# Patient Record
Sex: Male | Born: 1945 | Race: Black or African American | Hispanic: No | Marital: Single | Smoking: Former smoker
Health system: Southern US, Community
[De-identification: ages and names within clinical notes are randomized; demographics above are authoritative.]

## PROBLEM LIST (undated history)

## (undated) DIAGNOSIS — K219 Gastro-esophageal reflux disease without esophagitis: Secondary | ICD-10-CM

## (undated) DIAGNOSIS — D649 Anemia, unspecified: Secondary | ICD-10-CM

## (undated) DIAGNOSIS — K5909 Other constipation: Secondary | ICD-10-CM

## (undated) DIAGNOSIS — K4090 Unilateral inguinal hernia, without obstruction or gangrene, not specified as recurrent: Secondary | ICD-10-CM

## (undated) DIAGNOSIS — E785 Hyperlipidemia, unspecified: Secondary | ICD-10-CM

## (undated) DIAGNOSIS — J3089 Other allergic rhinitis: Secondary | ICD-10-CM

## (undated) DIAGNOSIS — I351 Nonrheumatic aortic (valve) insufficiency: Secondary | ICD-10-CM

## (undated) DIAGNOSIS — Z8546 Personal history of malignant neoplasm of prostate: Secondary | ICD-10-CM

## (undated) DIAGNOSIS — Z923 Personal history of irradiation: Secondary | ICD-10-CM

## (undated) DIAGNOSIS — I10 Essential (primary) hypertension: Secondary | ICD-10-CM

## (undated) DIAGNOSIS — Z974 Presence of external hearing-aid: Secondary | ICD-10-CM

## (undated) DIAGNOSIS — Z973 Presence of spectacles and contact lenses: Secondary | ICD-10-CM

## (undated) DIAGNOSIS — I6521 Occlusion and stenosis of right carotid artery: Secondary | ICD-10-CM

## (undated) DIAGNOSIS — H9190 Unspecified hearing loss, unspecified ear: Secondary | ICD-10-CM

## (undated) DIAGNOSIS — Z972 Presence of dental prosthetic device (complete) (partial): Secondary | ICD-10-CM

## (undated) DIAGNOSIS — C61 Malignant neoplasm of prostate: Secondary | ICD-10-CM

## (undated) HISTORY — DX: Occlusion and stenosis of right carotid artery: I65.21

## (undated) HISTORY — PX: OTHER SURGICAL HISTORY: SHX169

## (undated) HISTORY — DX: Anemia, unspecified: D64.9

## (undated) HISTORY — PX: CARDIAC CATHETERIZATION: SHX172

## (undated) HISTORY — DX: Nonrheumatic aortic (valve) insufficiency: I35.1

## (undated) HISTORY — DX: Gastro-esophageal reflux disease without esophagitis: K21.9

## (undated) HISTORY — DX: Malignant neoplasm of prostate: C61

---

## 2000-05-04 HISTORY — PX: CARDIAC CATHETERIZATION: SHX172

## 2002-05-04 HISTORY — PX: PROSTATECTOMY: SHX69

## 2003-05-05 HISTORY — PX: CYST EXCISION: SHX5701

## 2018-05-04 HISTORY — PX: COLONOSCOPY: SHX174

## 2019-05-05 HISTORY — PX: UPPER GI ENDOSCOPY: SHX6162

## 2019-10-26 ENCOUNTER — Other Ambulatory Visit: Payer: Self-pay | Admitting: Orthopedic Surgery

## 2019-11-03 NOTE — Pre-Procedure Instructions (Signed)
Kenneth Daniel  11/03/2019    Your procedure is scheduled on Thursday, November 09, 2019 at 7:30 AM.   Report to [facility] Entrance "A" Admitting Office at 5:30 AM.   Call this number if you have problems the morning of surgery: [phone]   Questions prior to day of surgery, please call [phone] between 8 & 4 PM.   Remember:  Do not eat food after midnight Wednesday 11/08/19.  You may drink clear liquids until 4:30 AM. Clear liquids allowed are: Water, Juice (non-citric and without pulp - diabetics please choose diet or no sugar options), Carbonated beverages - (diabetics please choose diet or no sugar options), Clear Tea, Black Coffee only (no creamer, milk or cream including half and half) and Gatorade (diabetics please choose diet or no sugar options)   Drink the Pre-Surgery Ensure between 4:15 AM and 4:30 AM. This will be the last liquids you will drink prior to surgery.    Take these medicines the morning of surgery with A SIP OF WATER: Nebivolol (Bystolic), Acetaminophen (Tylenol) - if needed.  Stop all Vitamins and Nutritional supplements as of today prior to surgery. Do not use NSAIDS (Ibuprofen, Aleve, etc), Aspirin containing products, Herbal medications or Fish Oil.    Do not wear jewelry.  Do not wear lotions, powders, cologne or deodorant.  Men may shave face and neck.  Do not bring valuables to the hospital.  [facility] is not responsible for any belongings or valuables.  Contacts, dentures or bridgework may not be worn into surgery.  Leave your suitcase in the car.  After surgery it may be brought to your room.  For patients admitted to the hospital, discharge time will be determined by your treatment team.  Patients discharged the day of surgery will not be allowed to drive home.   [place] - Preparing for Surgery  Before surgery, you can play an important role.  Because skin is not sterile, your skin needs to be as free of germs as possible.  You can  reduce the number of germs on you skin by washing with CHG (chlorahexidine gluconate) soap before surgery.  CHG is an antiseptic cleaner which kills germs and bonds with the skin to continue killing germs even after washing.  Oral Hygiene is also important in reducing the risk of infection.  Remember to brush your teeth with your regular toothpaste the morning of surgery.  Please DO NOT use if you have an allergy to CHG or antibacterial soaps.  If your skin becomes reddened/irritated stop using the CHG and inform your nurse when you arrive at Short Stay.  Do not shave (including legs and underarms) for at least 48 hours prior to the first CHG shower.  You may shave your face.  Please follow these instructions carefully:   1.  Shower with CHG Soap the night before surgery and the morning of Surgery.  2.  If you choose to wash your hair, wash your hair first as usual with your normal shampoo.  3.  After you shampoo, rinse your hair and body thoroughly to remove the shampoo. 4.  Use CHG as you would any other liquid soap.  You can apply chg directly to the skin and wash gently with a      scrungie or washcloth.           5.  Apply the CHG Soap to your body ONLY FROM THE NECK DOWN.   Do not use on open wounds or  open sores. Avoid contact with your eyes, ears, mouth and genitals (private parts).  Wash genitals (private parts) with your normal soap - do this prior to surgery.  6.  Wash thoroughly, paying special attention to the area where your surgery will be performed.  7.  Thoroughly rinse your body with warm water from the neck down.  8.  DO NOT shower/wash with your normal soap after using and rinsing off the CHG Soap.  9.  Pat yourself dry with a clean towel.            10.  Wear clean pajamas.            11.  Place clean sheets on your bed the night of your first shower and do not sleep with pets.  Day of Surgery  Shower as above. Do not apply any lotions/deodorants the morning of surgery.    Please wear clean clothes to the hospital. Remember to brush your teeth with toothpaste.  Please read over the fact sheets that you were given.

## 2019-11-07 ENCOUNTER — Other Ambulatory Visit: Payer: Self-pay

## 2019-11-07 ENCOUNTER — Encounter (HOSPITAL_COMMUNITY): Payer: Self-pay

## 2019-11-07 ENCOUNTER — Encounter (HOSPITAL_COMMUNITY)
Admission: RE | Admit: 2019-11-07 | Discharge: 2019-11-07 | Disposition: A | Discharge status: Home / Self Care | Payer: Medicare PPO | Source: Ambulatory Visit | Attending: Orthopedic Surgery | Admitting: Orthopedic Surgery

## 2019-11-07 ENCOUNTER — Other Ambulatory Visit (HOSPITAL_COMMUNITY)
Admission: RE | Admit: 2019-11-07 | Discharge: 2019-11-07 | Disposition: A | Discharge status: Home / Self Care | Payer: Medicare PPO | Source: Ambulatory Visit | Attending: Orthopedic Surgery | Admitting: Orthopedic Surgery

## 2019-11-07 DIAGNOSIS — R5383 Other fatigue: Secondary | ICD-10-CM | POA: Diagnosis not present

## 2019-11-07 DIAGNOSIS — Z87891 Personal history of nicotine dependence: Secondary | ICD-10-CM | POA: Diagnosis not present

## 2019-11-07 DIAGNOSIS — Z7901 Long term (current) use of anticoagulants: Secondary | ICD-10-CM | POA: Diagnosis not present

## 2019-11-07 DIAGNOSIS — Z79899 Other long term (current) drug therapy: Secondary | ICD-10-CM | POA: Diagnosis not present

## 2019-11-07 DIAGNOSIS — Z20822 Contact with and (suspected) exposure to covid-19: Secondary | ICD-10-CM | POA: Diagnosis not present

## 2019-11-07 DIAGNOSIS — Z01812 Encounter for preprocedural laboratory examination: Secondary | ICD-10-CM | POA: Insufficient documentation

## 2019-11-07 DIAGNOSIS — I10 Essential (primary) hypertension: Secondary | ICD-10-CM | POA: Insufficient documentation

## 2019-11-07 DIAGNOSIS — G9589 Other specified diseases of spinal cord: Secondary | ICD-10-CM | POA: Insufficient documentation

## 2019-11-07 DIAGNOSIS — R0602 Shortness of breath: Secondary | ICD-10-CM | POA: Insufficient documentation

## 2019-11-07 DIAGNOSIS — K219 Gastro-esophageal reflux disease without esophagitis: Secondary | ICD-10-CM | POA: Insufficient documentation

## 2019-11-07 DIAGNOSIS — Z7982 Long term (current) use of aspirin: Secondary | ICD-10-CM | POA: Diagnosis not present

## 2019-11-07 HISTORY — DX: Essential (primary) hypertension: I10

## 2019-11-07 LAB — URINALYSIS, ROUTINE W REFLEX MICROSCOPIC
Bilirubin Urine: NEGATIVE
Glucose, UA: NEGATIVE mg/dL
Hgb urine dipstick: NEGATIVE
Ketones, ur: NEGATIVE mg/dL
Leukocytes,Ua: NEGATIVE
Nitrite: NEGATIVE
Protein, ur: NEGATIVE mg/dL
Specific Gravity, Urine: 1.005 (ref 1.005–1.030)
pH: 6 (ref 5.0–8.0)

## 2019-11-07 LAB — CBC WITH DIFFERENTIAL/PLATELET
Abs Immature Granulocytes: 0 10*3/uL (ref 0.00–0.07)
Basophils Absolute: 0 10*3/uL (ref 0.0–0.1)
Basophils Relative: 1 %
Eosinophils Absolute: 0 10*3/uL (ref 0.0–0.5)
Eosinophils Relative: 1 %
HCT: 40.8 % (ref 39.0–52.0)
Hemoglobin: 12.9 g/dL — ABNORMAL LOW (ref 13.0–17.0)
Immature Granulocytes: 0 %
Lymphocytes Relative: 15 %
Lymphs Abs: 0.5 10*3/uL — ABNORMAL LOW (ref 0.7–4.0)
MCH: 29 pg (ref 26.0–34.0)
MCHC: 31.6 g/dL (ref 30.0–36.0)
MCV: 91.7 fL (ref 80.0–100.0)
Monocytes Absolute: 0.4 10*3/uL (ref 0.1–1.0)
Monocytes Relative: 11 %
Neutro Abs: 2.8 10*3/uL (ref 1.7–7.7)
Neutrophils Relative %: 72 %
Platelets: 178 10*3/uL (ref 150–400)
RBC: 4.45 MIL/uL (ref 4.22–5.81)
RDW: 16 % — ABNORMAL HIGH (ref 11.5–15.5)
WBC: 3.7 10*3/uL — ABNORMAL LOW (ref 4.0–10.5)
nRBC: 0 % (ref 0.0–0.2)

## 2019-11-07 LAB — COMPREHENSIVE METABOLIC PANEL
ALT: 33 U/L (ref 0–44)
AST: 25 U/L (ref 15–41)
Albumin: 3.6 g/dL (ref 3.5–5.0)
Alkaline Phosphatase: 56 U/L (ref 38–126)
Anion gap: 10 (ref 5–15)
BUN: 18 mg/dL (ref 8–23)
CO2: 26 mmol/L (ref 22–32)
Calcium: 9.3 mg/dL (ref 8.9–10.3)
Chloride: 104 mmol/L (ref 98–111)
Creatinine, Ser: 0.99 mg/dL (ref 0.61–1.24)
GFR calc Af Amer: >60 mL/min (ref >60)
GFR calc non Af Amer: >60 mL/min (ref >60)
Glucose, Bld: 100 mg/dL — ABNORMAL HIGH (ref 70–99)
Potassium: 4 mmol/L (ref 3.5–5.1)
Sodium: 140 mmol/L (ref 135–145)
Total Bilirubin: 0.7 mg/dL (ref 0.3–1.2)
Total Protein: 7 g/dL (ref 6.5–8.1)

## 2019-11-07 LAB — SARS CORONAVIRUS 2 (TAT 6-24 HRS): SARS Coronavirus 2: NEGATIVE

## 2019-11-07 LAB — PROTIME-INR
INR: 1.1 (ref 0.8–1.2)
Prothrombin Time: 13.5 seconds (ref 11.4–15.2)

## 2019-11-07 LAB — SURGICAL PCR SCREEN
MRSA, PCR: NEGATIVE
Staphylococcus aureus: NEGATIVE

## 2019-11-07 LAB — TYPE AND SCREEN
ABO/RH(D): B POS
Antibody Screen: NEGATIVE

## 2019-11-07 LAB — APTT: aPTT: 27 seconds (ref 24–36)

## 2019-11-07 NOTE — Progress Notes (Signed)
Anesthesia Note:   Case: [id number] Date/Time: 11/09/19 0715   Procedure: ANTERIOR CERVICAL DECOMPRESSION FUSION CERVICAL 3-4 WITH INSTRUMENTATION AND ALLOGRAFT (N/A )   Anesthesia type: General   Pre-op diagnosis: CERVICAL MYELOPATHY   Location: [facility] OR ROOM 05 / [place]   Surgeons: Phylliss Bob, MD      DISCUSSION: Patient is a 74 year old male scheduled for the above procedure.  History includes former smoker (quit 05/04/72), HTN, hypercholesterolemia, anemia (borderline), acid reflux, prostate cancer (s/p prostatectomy 2004). He reported a negative sleep study ~ 6-7 years ago.   He had a recent cardiology evaluation in December 2020 at [facility] in [place] by Orie Rout, MD. On 10/23/19, Dr. Rodena Medin signed a cardiac clearance form indicating, "This patient is not at increased risk for cardiovascular event for planned surgery versus an age-matched cohort."  Last ASA 11/03/19. 11/07/19 presurgical COVID-19 test is in process.  Anesthesia team to evaluate on the day of surgery.   VS: BP (!) 162/91   Pulse 83   Temp 36.6 C (Oral)   Resp 18   Ht 6\' 2"  (1.88 m)   Wt 77.7 kg   SpO2 99%   BMI 21.98 kg/m     PROVIDERS: Lucianne Lei, MD is PCP As above, saw Orie Rout, MD for cardiologist evaluation in late 2020.   LABS: Labs reviewed: Acceptable for surgery. (all labs ordered are listed, but only abnormal results are displayed)  Labs Reviewed  CBC WITH DIFFERENTIAL/PLATELET - Abnormal; Notable for the following components:      Result Value   WBC 3.7 (*)    Hemoglobin 12.9 (*)    RDW 16.0 (*)    Lymphs Abs 0.5 (*)    All other components within normal limits  COMPREHENSIVE METABOLIC PANEL - Abnormal; Notable for the following components:   Glucose, Bld 100 (*)    All other components within normal limits  URINALYSIS, ROUTINE W REFLEX MICROSCOPIC - Abnormal; Notable for the following components:   Color, Urine STRAW (*)    All other components within normal limits   SURGICAL PCR SCREEN  APTT  PROTIME-INR  TYPE AND SCREEN     EKG: Copies of EKGs during 04/23/20 exercise stress test [facility]) showed: NSR, baseline artifact. Probably borderline/non-specific inferior ST abnormality--no significant ST/T abnormality noted.      CV: Echo 04/24/19 [facility]): Conclusions: Left ventricle cavity is normal in size. Concentric remodeling of the left ventricle. Normal global wall motion. Visual EF is 55 to 60%. Doppler evidence of grade 1 (impaired) diastolic dysfunction, normal LAP. Calculated EF 55%. Trileaflet aortic valve with no regurgitation noted. Mild aortic valve leaflet thickening with mild calcification.  Exercise Nuclear stress test 04/24/19 [facility]): Stress ECG: No evidence of ischemia. BP response: Normal. Arrhythmias: None. Symptoms: Shortness of breath.  Fatigue. Exercise tolerance: Good. SPECT imaging findings: No evidence of ischemia.  No wall motion abnormalities.  Ejection fraction 60%. Impression: No evidence of ischemia.  He reported a remote history of cardiac cath in 2001 that did not require any intervention.   Past Medical History:  Diagnosis Date  . Acid reflux   . Borderline anemia   . Hypertension   . Prostate cancer [facility])     Past Surgical History:  Procedure Laterality Date  . PROSTATECTOMY  2004    MEDICATIONS: . acetaminophen (TYLENOL) 500 MG tablet  . aluminum hydroxide-magnesium carbonate (GAVISCON) 95-358 MG/15ML SUSP  . amLODipine (NORVASC) 10 MG tablet  . ascorbic acid (VITAMIN C) 500 MG tablet  .  aspirin EC 81 MG tablet  . atorvastatin (LIPITOR) 40 MG tablet  . Azelastine HCl 0.15 % SOLN  . b complex vitamins tablet  . cholecalciferol (VITAMIN D3) 25 MCG (1000 UNIT) tablet  . famotidine (PEPCID) 40 MG tablet  . Magnesium 250 MG TABS  . Nebivolol HCl (BYSTOLIC) 20 MG TABS  . Nutritional Supplements (JUICE PLUS FIBRE PO)  . Probiotic Product (PROBIOTIC  DAILY PO)  . quinapril (ACCUPRIL) 40 MG tablet  . sacubitril-valsartan (ENTRESTO) 24-26 MG  . zinc gluconate 50 MG tablet   No current facility-administered medications for this encounter.    Myra Gianotti, PA-C Surgical Short Stay/Anesthesiology [facility] Phone [phone] [facility] Phone [phone] 11/07/2019 2:35 PM

## 2019-11-07 NOTE — Progress Notes (Addendum)
PCP - Dr. Lucianne Lei Cardiologist - Dr. Orie Rout  PPM/ICD - denies  Chest x-ray - N/A EKG - 04/23/2019 (tracing faxed)  Stress Test - 04/24/2019 ECHO - 04/24/2019 Cardiac Cath - 2001 per patient no stent placed  Sleep Study - YES, patient stated it was about 6-7 years ago but was negative   DM: denies  Blood Thinner Instructions: N/A Aspirin Instructions: LD 11/03/2019  ERAS Protcol - Yes PRE-SURGERY Ensure or G2- Ensure given  COVID TEST-  Scheduled for today 11/07/2019 after PAT appointment. Patient verbalized understanding of self-quarantine instructions.  Anesthesia review: YES, cardiac hx, records faxed, new to health system  Patient denies shortness of breath, fever, cough and chest pain at PAT appointment  All instructions explained to the patient, with a verbal understanding of the material. Patient agrees to go over the instructions while at home for a better understanding. Patient also instructed to self quarantine after being tested for COVID-19. The opportunity to ask questions was provided.

## 2019-11-07 NOTE — Progress Notes (Addendum)
[id number] - [place], [place] [address] [place] [address] Phone: [phone] Fax: [phone]   Your procedure is scheduled on Thursday, July 8th.  Report to [facility] Main Entrance "A" at 5:30 A.M., and check in at the Admitting office.  Call this number if you have problems the morning of surgery:  [phone]  Call [phone] if you have any questions prior to your surgery date Monday-Friday 8am-4pm   Remember:  Do not eat after midnight the night before your surgery  You may drink clear liquids until 4:30 A.M the morning of your surgery.   Clear liquids allowed are: Water, Non-Citrus Juices (without pulp), Carbonated Beverages, Clear Tea, Black Coffee Only, and Gatorade   Enhanced Recovery after Surgery for Orthopedics Enhanced Recovery after Surgery is a protocol used to improve the stress on your body and your recovery after surgery.  Patient Instructions  . The night before surgery:  o No food after midnight. ONLY clear liquids after midnight  .  Marland Kitchen The day of surgery (if you do NOT have diabetes):  o Drink ONE (1) Pre-Surgery Clear Ensure 4:30 A.M. the morning of surgery   o This drink was given to you during your hospital  pre-op appointment visit. o Nothing else to drink after completing the  Pre-Surgery Clear Ensure.         If you have questions, please contact your surgeon's office.     Take these medicines the morning of surgery with A SIP OF WATER Nebivolol HCl (BYSTOLIC)  If needed - acetaminophen (TYLENOL), Azelastine HCl/nasal spray    As of today, STOP taking any Aspirin (unless otherwise instructed by your surgeon) Aleve, Naproxen, Ibuprofen, Motrin, Advil, Goody's, BC's, all herbal medications, fish oil, and all vitamins.                     Do not wear jewelry.            Do not wear lotions, powders, perfumes/colognes, or deodorant.            Men may shave face  and neck.            Do not bring valuables to the hospital.            [facility] is not responsible for any belongings or valuables.  Do NOT Smoke (Tobacco/Vaping) or drink Alcohol 24 hours prior to your procedure If you use a CPAP at night, you may bring all equipment for your overnight stay.   Contacts, glasses, dentures or bridgework may not be worn into surgery.      For patients admitted to the hospital, discharge time will be determined by your treatment team.   Patients discharged the day of surgery will not be allowed to drive home, and someone needs to stay with them for 24 hours.  Special instructions:   [place]- Preparing For Surgery  Before surgery, you can play an important role. Because skin is not sterile, your skin needs to be as free of germs as possible. You can reduce the number of germs on your skin by washing with CHG (chlorahexidine gluconate) Soap before surgery.  CHG is an antiseptic cleaner which kills germs and bonds with the skin to continue killing germs even after washing.    Oral Hygiene is also important to reduce your risk of infection.  Remember - BRUSH YOUR TEETH THE MORNING OF SURGERY WITH YOUR REGULAR  TOOTHPASTE  Please do not use if you have an allergy to CHG or antibacterial soaps. If your skin becomes reddened/irritated stop using the CHG.  Do not shave (including legs and underarms) for at least 48 hours prior to first CHG shower. It is OK to shave your face.  Please follow these instructions carefully.   1. Shower the NIGHT BEFORE SURGERY and the MORNING OF SURGERY with CHG Soap.   2. If you chose to wash your hair, wash your hair first as usual with your normal shampoo.  3. After you shampoo, rinse your hair and body thoroughly to remove the shampoo.  4. Use CHG as you would any other liquid soap. You can apply CHG directly to the skin and wash gently with a scrungie or a clean washcloth.   5. Apply the CHG Soap to your body ONLY FROM  THE NECK DOWN.  Do not use on open wounds or open sores. Avoid contact with your eyes, ears, mouth and genitals (private parts). Wash Face and genitals (private parts)  with your normal soap.   6. Wash thoroughly, paying special attention to the area where your surgery will be performed.  7. Thoroughly rinse your body with warm water from the neck down.  8. DO NOT shower/wash with your normal soap after using and rinsing off the CHG Soap.  9. Pat yourself dry with a CLEAN TOWEL.  10. Wear CLEAN PAJAMAS to bed the night before surgery  11. Place CLEAN SHEETS on your bed the night of your first shower and DO NOT SLEEP WITH PETS.   Day of Surgery: Wear Clean/Comfortable clothing the morning of surgery Do not apply any deodorants/lotions.   Remember to brush your teeth WITH YOUR REGULAR TOOTHPASTE.   Please read over the following fact sheets that you were given.

## 2019-11-07 NOTE — Anesthesia Preprocedure Evaluation (Addendum)
Anesthesia Evaluation  Patient identified by MRN, date of birth, ID band Patient awake    Reviewed: Allergy & Precautions, H&P , NPO status , Patient's Chart, lab work & pertinent test results  Airway Mallampati: II   Neck ROM: full    Dental   Pulmonary former smoker,    breath sounds clear to auscultation       Cardiovascular hypertension,  Rhythm:regular Rate:Normal     Neuro/Psych    GI/Hepatic GERD  ,  Endo/Other    Renal/GU      Musculoskeletal   Abdominal   Peds  Hematology   Anesthesia Other Findings   Reproductive/Obstetrics                             Anesthesia Physical Anesthesia Plan  ASA: II  Anesthesia Plan: General   Post-op Pain Management:    Induction: Intravenous  PONV Risk Score and Plan: 2 and Ondansetron, Dexamethasone and Treatment may vary due to age or medical condition  Airway Management Planned: Oral ETT  Additional Equipment:   Intra-op Plan:   Post-operative Plan: Extubation in OR  Informed Consent: I have reviewed the patients History and Physical, chart, labs and discussed the procedure including the risks, benefits and alternatives for the proposed anesthesia with the patient or authorized representative who has indicated his/her understanding and acceptance.       Plan Discussed with: CRNA, Anesthesiologist and Surgeon  Anesthesia Plan Comments: (PAT note written 11/07/2019 by Myra Gianotti, PA-C. )       Anesthesia Quick Evaluation

## 2019-11-09 ENCOUNTER — Other Ambulatory Visit: Payer: Self-pay

## 2019-11-09 ENCOUNTER — Ambulatory Visit (HOSPITAL_COMMUNITY)
Admission: RE | Disposition: A | Discharge status: Home / Self Care | Payer: Self-pay | Source: Home / Self Care | Attending: Orthopedic Surgery

## 2019-11-09 ENCOUNTER — Observation Stay (HOSPITAL_COMMUNITY)
Admission: RE | Admit: 2019-11-09 | Discharge: 2019-11-10 | Disposition: A | Discharge status: Home / Self Care | Payer: Medicare PPO | Attending: Orthopedic Surgery | Admitting: Orthopedic Surgery

## 2019-11-09 ENCOUNTER — Ambulatory Visit (HOSPITAL_COMMUNITY): Payer: Medicare PPO

## 2019-11-09 ENCOUNTER — Ambulatory Visit (HOSPITAL_COMMUNITY): Payer: Medicare PPO | Admitting: Vascular Surgery

## 2019-11-09 ENCOUNTER — Encounter (HOSPITAL_COMMUNITY): Payer: Self-pay | Admitting: Orthopedic Surgery

## 2019-11-09 DIAGNOSIS — I10 Essential (primary) hypertension: Secondary | ICD-10-CM | POA: Insufficient documentation

## 2019-11-09 DIAGNOSIS — Z79899 Other long term (current) drug therapy: Secondary | ICD-10-CM | POA: Diagnosis not present

## 2019-11-09 DIAGNOSIS — G959 Disease of spinal cord, unspecified: Secondary | ICD-10-CM | POA: Diagnosis present

## 2019-11-09 DIAGNOSIS — Z7982 Long term (current) use of aspirin: Secondary | ICD-10-CM | POA: Diagnosis not present

## 2019-11-09 DIAGNOSIS — R2689 Other abnormalities of gait and mobility: Secondary | ICD-10-CM | POA: Diagnosis present

## 2019-11-09 DIAGNOSIS — Z87891 Personal history of nicotine dependence: Secondary | ICD-10-CM | POA: Diagnosis not present

## 2019-11-09 DIAGNOSIS — M5001 Cervical disc disorder with myelopathy,  high cervical region: Secondary | ICD-10-CM | POA: Diagnosis not present

## 2019-11-09 DIAGNOSIS — Z419 Encounter for procedure for purposes other than remedying health state, unspecified: Secondary | ICD-10-CM

## 2019-11-09 HISTORY — DX: Hyperlipidemia, unspecified: E78.5

## 2019-11-09 HISTORY — DX: Presence of spectacles and contact lenses: Z97.3

## 2019-11-09 HISTORY — PX: ANTERIOR CERVICAL DECOMP/DISCECTOMY FUSION: SHX1161

## 2019-11-09 HISTORY — DX: Unspecified hearing loss, unspecified ear: H91.90

## 2019-11-09 LAB — ABO/RH: ABO/RH(D): B POS

## 2019-11-09 SURGERY — ANTERIOR CERVICAL DECOMPRESSION/DISCECTOMY FUSION 1 LEVEL
Anesthesia: General | Site: Spine Cervical

## 2019-11-09 MED ORDER — QUINAPRIL HCL 10 MG PO TABS: 40.0000 mg | ORAL_TABLET | Freq: Every day | ORAL | Status: DC

## 2019-11-09 MED ORDER — AMLODIPINE BESYLATE 5 MG PO TABS
10.0000 mg | ORAL_TABLET | Freq: Every day | ORAL | Status: DC
  Administered 2019-11-09: 10 mg via ORAL
  Filled 2019-11-09: qty 2

## 2019-11-09 MED ORDER — ONDANSETRON HCL 4 MG/2ML IJ SOLN
INTRAMUSCULAR | Status: DC | PRN
  Administered 2019-11-09: 4 mg via INTRAVENOUS

## 2019-11-09 MED ORDER — PHENOL 1.4 % MT LIQD
1.0000 | OROMUCOSAL | Status: DC | PRN
  Filled 2019-11-09: qty 177

## 2019-11-09 MED ORDER — ACETAMINOPHEN 10 MG/ML IV SOLN
INTRAVENOUS | Status: DC | PRN
Start: 2019-11-09 — End: 2019-11-09
  Administered 2019-11-09: 1000 mg via INTRAVENOUS

## 2019-11-09 MED ORDER — DEXAMETHASONE SODIUM PHOSPHATE 10 MG/ML IJ SOLN
INTRAMUSCULAR | Status: AC
  Filled 2019-11-09: qty 1

## 2019-11-09 MED ORDER — ONDANSETRON HCL 4 MG/2ML IJ SOLN
INTRAMUSCULAR | Status: AC
  Filled 2019-11-09: qty 2

## 2019-11-09 MED ORDER — ACETAMINOPHEN 10 MG/ML IV SOLN
INTRAVENOUS | Status: AC
  Filled 2019-11-09: qty 100

## 2019-11-09 MED ORDER — OXYCODONE HCL 5 MG PO TABS: 5.0000 mg | ORAL_TABLET | Freq: Once | ORAL | Status: DC | PRN

## 2019-11-09 MED ORDER — FENTANYL CITRATE (PF) 100 MCG/2ML IJ SOLN: 25.0000 ug | INTRAMUSCULAR | Status: DC | PRN

## 2019-11-09 MED ORDER — ALUM HYDROXIDE-MAG CARBONATE 95-358 MG/15ML PO SUSP: 30.0000 mL | ORAL | Status: DC | PRN

## 2019-11-09 MED ORDER — LIDOCAINE 2% (20 MG/ML) 5 ML SYRINGE
INTRAMUSCULAR | Status: DC | PRN
  Administered 2019-11-09: 60 mg via INTRAVENOUS

## 2019-11-09 MED ORDER — ONDANSETRON HCL 4 MG/2ML IJ SOLN
4.0000 mg | Freq: Four times a day (QID) | INTRAMUSCULAR | Status: AC | PRN
  Administered 2019-11-09: 4 mg via INTRAVENOUS

## 2019-11-09 MED ORDER — OXYCODONE-ACETAMINOPHEN 5-325 MG PO TABS: 1.0000 | ORAL_TABLET | ORAL | 0 refills | Status: DC | PRN

## 2019-11-09 MED ORDER — ONDANSETRON HCL 4 MG/2ML IJ SOLN: 4.0000 mg | Freq: Four times a day (QID) | INTRAMUSCULAR | Status: DC | PRN

## 2019-11-09 MED ORDER — NEBIVOLOL HCL 10 MG PO TABS
20.0000 mg | ORAL_TABLET | Freq: Every day | ORAL | Status: DC
  Administered 2019-11-10: 20 mg via ORAL
  Filled 2019-11-09: qty 2

## 2019-11-09 MED ORDER — DEXAMETHASONE SODIUM PHOSPHATE 10 MG/ML IJ SOLN
INTRAMUSCULAR | Status: DC | PRN
  Administered 2019-11-09: 10 mg via INTRAVENOUS

## 2019-11-09 MED ORDER — PHENYLEPHRINE HCL (PRESSORS) 10 MG/ML IV SOLN
INTRAVENOUS | Status: DC | PRN
  Administered 2019-11-09 (×3): 80 ug via INTRAVENOUS

## 2019-11-09 MED ORDER — ZOLPIDEM TARTRATE 5 MG PO TABS: 5.0000 mg | ORAL_TABLET | Freq: Every evening | ORAL | Status: DC | PRN

## 2019-11-09 MED ORDER — SUGAMMADEX SODIUM 200 MG/2ML IV SOLN
INTRAVENOUS | Status: DC | PRN
  Administered 2019-11-09: 200 mg via INTRAVENOUS

## 2019-11-09 MED ORDER — HEMOSTATIC AGENTS (NO CHARGE) OPTIME
TOPICAL | Status: DC | PRN
  Administered 2019-11-09 (×2): 1 via TOPICAL

## 2019-11-09 MED ORDER — ATORVASTATIN CALCIUM 40 MG PO TABS
40.0000 mg | ORAL_TABLET | Freq: Every day | ORAL | Status: DC
  Administered 2019-11-09: 40 mg via ORAL
  Filled 2019-11-09: qty 1

## 2019-11-09 MED ORDER — FENTANYL CITRATE (PF) 250 MCG/5ML IJ SOLN
INTRAMUSCULAR | Status: DC | PRN
  Administered 2019-11-09: 25 ug via INTRAVENOUS
  Administered 2019-11-09 (×2): 50 ug via INTRAVENOUS
  Administered 2019-11-09: 25 ug via INTRAVENOUS

## 2019-11-09 MED ORDER — VITAMIN D 25 MCG (1000 UNIT) PO TABS: 1000.0000 [IU] | ORAL_TABLET | Freq: Every day | ORAL | Status: DC

## 2019-11-09 MED ORDER — BISACODYL 5 MG PO TBEC: 5.0000 mg | DELAYED_RELEASE_TABLET | Freq: Every day | ORAL | Status: DC | PRN

## 2019-11-09 MED ORDER — MAGNESIUM 250 MG PO TABS: 250.0000 mg | ORAL_TABLET | Freq: Every day | ORAL | Status: DC

## 2019-11-09 MED ORDER — ROCURONIUM BROMIDE 10 MG/ML (PF) SYRINGE
PREFILLED_SYRINGE | INTRAVENOUS | Status: AC
  Filled 2019-11-09: qty 10

## 2019-11-09 MED ORDER — PHENYLEPHRINE HCL-NACL 10-0.9 MG/250ML-% IV SOLN
INTRAVENOUS | Status: AC
  Filled 2019-11-09: qty 250

## 2019-11-09 MED ORDER — SODIUM CHLORIDE 0.9 % IV SOLN: 250.0000 mL | INTRAVENOUS | Status: DC

## 2019-11-09 MED ORDER — FENTANYL CITRATE (PF) 250 MCG/5ML IJ SOLN
INTRAMUSCULAR | Status: AC
  Filled 2019-11-09: qty 5

## 2019-11-09 MED ORDER — METHOCARBAMOL 500 MG PO TABS: 500.0000 mg | ORAL_TABLET | Freq: Four times a day (QID) | ORAL | 1 refills | Status: DC | PRN

## 2019-11-09 MED ORDER — PHENYLEPHRINE HCL-NACL 10-0.9 MG/250ML-% IV SOLN
INTRAVENOUS | Status: DC | PRN
  Administered 2019-11-09: 20 ug/min via INTRAVENOUS

## 2019-11-09 MED ORDER — METHOCARBAMOL 1000 MG/10ML IJ SOLN
500.0000 mg | Freq: Four times a day (QID) | INTRAVENOUS | Status: DC | PRN
  Filled 2019-11-09: qty 5

## 2019-11-09 MED ORDER — FAMOTIDINE 20 MG PO TABS
40.0000 mg | ORAL_TABLET | Freq: Every day | ORAL | Status: DC
  Administered 2019-11-09: 40 mg via ORAL
  Filled 2019-11-09: qty 2

## 2019-11-09 MED ORDER — ACETAMINOPHEN 650 MG RE SUPP: 650.0000 mg | RECTAL | Status: DC | PRN

## 2019-11-09 MED ORDER — DEXMEDETOMIDINE HCL 200 MCG/2ML IV SOLN
INTRAVENOUS | Status: DC | PRN
  Administered 2019-11-09: 8 ug via INTRAVENOUS

## 2019-11-09 MED ORDER — 0.9 % SODIUM CHLORIDE (POUR BTL) OPTIME
TOPICAL | Status: DC | PRN
  Administered 2019-11-09 (×2): 1000 mL

## 2019-11-09 MED ORDER — PANTOPRAZOLE SODIUM 40 MG IV SOLR: 40.0000 mg | Freq: Every day | INTRAVENOUS | Status: DC

## 2019-11-09 MED ORDER — ONDANSETRON HCL 4 MG PO TABS: 4.0000 mg | ORAL_TABLET | Freq: Four times a day (QID) | ORAL | Status: DC | PRN

## 2019-11-09 MED ORDER — PANTOPRAZOLE SODIUM 40 MG PO TBEC
40.0000 mg | DELAYED_RELEASE_TABLET | Freq: Every day | ORAL | Status: DC
  Administered 2019-11-09: 40 mg via ORAL
  Filled 2019-11-09: qty 1

## 2019-11-09 MED ORDER — SODIUM CHLORIDE 0.9% FLUSH
3.0000 mL | Freq: Two times a day (BID) | INTRAVENOUS | Status: DC
  Administered 2019-11-09: 3 mL via INTRAVENOUS

## 2019-11-09 MED ORDER — LACTATED RINGERS IV SOLN: INTRAVENOUS | Status: DC | PRN

## 2019-11-09 MED ORDER — PROPOFOL 10 MG/ML IV BOLUS
INTRAVENOUS | Status: DC | PRN
  Administered 2019-11-09: 180 mg via INTRAVENOUS

## 2019-11-09 MED ORDER — ALUM & MAG HYDROXIDE-SIMETH 200-200-20 MG/5ML PO SUSP: 30.0000 mL | Freq: Four times a day (QID) | ORAL | Status: DC | PRN

## 2019-11-09 MED ORDER — CEFAZOLIN SODIUM-DEXTROSE 2-4 GM/100ML-% IV SOLN
2.0000 g | INTRAVENOUS | Status: AC
  Administered 2019-11-09: 2 g via INTRAVENOUS
  Filled 2019-11-09: qty 100

## 2019-11-09 MED ORDER — ROCURONIUM BROMIDE 10 MG/ML (PF) SYRINGE
PREFILLED_SYRINGE | INTRAVENOUS | Status: DC | PRN
  Administered 2019-11-09: 20 mg via INTRAVENOUS
  Administered 2019-11-09: 60 mg via INTRAVENOUS
  Administered 2019-11-09: 20 mg via INTRAVENOUS

## 2019-11-09 MED ORDER — BUPIVACAINE-EPINEPHRINE 0.25% -1:200000 IJ SOLN
INTRAMUSCULAR | Status: DC | PRN
  Administered 2019-11-09: 6.5 mL

## 2019-11-09 MED ORDER — SENNOSIDES-DOCUSATE SODIUM 8.6-50 MG PO TABS
1.0000 | ORAL_TABLET | Freq: Every evening | ORAL | Status: DC | PRN
  Administered 2019-11-09: 1 via ORAL
  Filled 2019-11-09: qty 1

## 2019-11-09 MED ORDER — PHENYLEPHRINE 40 MCG/ML (10ML) SYRINGE FOR IV PUSH (FOR BLOOD PRESSURE SUPPORT)
PREFILLED_SYRINGE | INTRAVENOUS | Status: AC
  Filled 2019-11-09: qty 10

## 2019-11-09 MED ORDER — OXYCODONE HCL 5 MG/5ML PO SOLN: 5.0000 mg | Freq: Once | ORAL | Status: DC | PRN

## 2019-11-09 MED ORDER — METHOCARBAMOL 500 MG PO TABS
500.0000 mg | ORAL_TABLET | Freq: Four times a day (QID) | ORAL | Status: DC | PRN
  Administered 2019-11-09: 500 mg via ORAL
  Filled 2019-11-09: qty 1

## 2019-11-09 MED ORDER — SODIUM CHLORIDE 0.9% FLUSH: 3.0000 mL | INTRAVENOUS | Status: DC | PRN

## 2019-11-09 MED ORDER — CEFAZOLIN SODIUM-DEXTROSE 2-4 GM/100ML-% IV SOLN
2.0000 g | Freq: Three times a day (TID) | INTRAVENOUS | Status: AC
  Administered 2019-11-09 (×2): 2 g via INTRAVENOUS
  Filled 2019-11-09 (×2): qty 100

## 2019-11-09 MED ORDER — THROMBIN (RECOMBINANT) 20000 UNITS EX SOLR
CUTANEOUS | Status: AC
  Filled 2019-11-09: qty 20000

## 2019-11-09 MED ORDER — QUINAPRIL HCL 10 MG PO TABS
40.0000 mg | ORAL_TABLET | Freq: Every day | ORAL | Status: DC
  Administered 2019-11-09: 40 mg via ORAL
  Filled 2019-11-09: qty 4

## 2019-11-09 MED ORDER — PROPOFOL 10 MG/ML IV BOLUS
INTRAVENOUS | Status: AC
  Filled 2019-11-09: qty 20

## 2019-11-09 MED ORDER — LIDOCAINE 2% (20 MG/ML) 5 ML SYRINGE
INTRAMUSCULAR | Status: AC
  Filled 2019-11-09: qty 5

## 2019-11-09 MED ORDER — FLEET ENEMA 7-19 GM/118ML RE ENEM: 1.0000 | ENEMA | Freq: Once | RECTAL | Status: DC | PRN

## 2019-11-09 MED ORDER — OXYCODONE-ACETAMINOPHEN 5-325 MG PO TABS
1.0000 | ORAL_TABLET | ORAL | Status: DC | PRN
  Administered 2019-11-09: 1 via ORAL
  Filled 2019-11-09: qty 1

## 2019-11-09 MED ORDER — ACETAMINOPHEN 325 MG PO TABS: 650.0000 mg | ORAL_TABLET | ORAL | Status: DC | PRN

## 2019-11-09 MED ORDER — BUPIVACAINE-EPINEPHRINE (PF) 0.25% -1:200000 IJ SOLN
INTRAMUSCULAR | Status: AC
  Filled 2019-11-09: qty 20

## 2019-11-09 MED ORDER — THROMBIN 20000 UNITS EX SOLR
CUTANEOUS | Status: DC | PRN
  Administered 2019-11-09: 20000 [IU] via TOPICAL

## 2019-11-09 MED ORDER — MENTHOL 3 MG MT LOZG: 1.0000 | LOZENGE | OROMUCOSAL | Status: DC | PRN

## 2019-11-09 MED ORDER — POVIDONE-IODINE 7.5 % EX SOLN
Freq: Once | CUTANEOUS | Status: DC
  Filled 2019-11-09: qty 118

## 2019-11-09 MED ORDER — AZELASTINE HCL 0.15 % NA SOLN: 1.0000 | Freq: Two times a day (BID) | NASAL | Status: DC | PRN

## 2019-11-09 MED ORDER — SACUBITRIL-VALSARTAN 24-26 MG PO TABS: 1.0000 | ORAL_TABLET | Freq: Two times a day (BID) | ORAL | Status: DC

## 2019-11-09 SURGICAL SUPPLY — 72 items
BENZOIN TINCTURE PRP APPL 2/3 (GAUZE/BANDAGES/DRESSINGS) ×2 IMPLANT
BIT DRILL NEURO 2X3.1 SFT TUCH (MISCELLANEOUS) ×1 IMPLANT
BIT DRILL SKYLINE 14 (BIT) ×1
BIT DRILL SKYLINE 14MM (BIT) ×1 IMPLANT
BLADE CLIPPER SURG (BLADE) ×2 IMPLANT
BLADE SURG 15 STRL LF DISP TIS (BLADE) ×1 IMPLANT
BLADE SURG 15 STRL SS (BLADE) ×1
BONE VIVIGEN FORMABLE 1.3CC (Bone Implant) ×2 IMPLANT
CARTRIDGE OIL MAESTRO DRILL (MISCELLANEOUS) ×1 IMPLANT
CLSR STERI-STRIP ANTIMIC 1/2X4 (GAUZE/BANDAGES/DRESSINGS) ×2 IMPLANT
CORD BIPOLAR FORCEPS 12FT (ELECTRODE) ×2 IMPLANT
COVER SURGICAL LIGHT HANDLE (MISCELLANEOUS) ×2 IMPLANT
COVER WAND RF STERILE (DRAPES) ×2 IMPLANT
DIFFUSER DRILL AIR PNEUMATIC (MISCELLANEOUS) ×2 IMPLANT
DRAIN JACKSON RD 7FR 3/32 (WOUND CARE) IMPLANT
DRAPE C-ARM 42X72 X-RAY (DRAPES) ×2 IMPLANT
DRAPE POUCH INSTRU U-SHP 10X18 (DRAPES) ×2 IMPLANT
DRAPE SURG 17X23 STRL (DRAPES) ×6 IMPLANT
DRILL BIT SKYLINE 14MM (BIT) ×1
DRILL NEURO 2X3.1 SOFT TOUCH (MISCELLANEOUS) ×2
DURAPREP 26ML APPLICATOR (WOUND CARE) ×2 IMPLANT
ELECT COATED BLADE 2.86 ST (ELECTRODE) ×2 IMPLANT
ELECT REM PT RETURN 9FT ADLT (ELECTROSURGICAL) ×2
ELECTRODE REM PT RTRN 9FT ADLT (ELECTROSURGICAL) ×1 IMPLANT
EVACUATOR SILICONE 100CC (DRAIN) IMPLANT
GAUZE 4X4 16PLY RFD (DISPOSABLE) ×2 IMPLANT
GAUZE SPONGE 4X4 12PLY STRL (GAUZE/BANDAGES/DRESSINGS) ×2 IMPLANT
GAUZE SPONGE 4X4 12PLY STRL LF (GAUZE/BANDAGES/DRESSINGS) ×2 IMPLANT
GLOVE BIO SURGEON STRL SZ7 (GLOVE) ×2 IMPLANT
GLOVE BIO SURGEON STRL SZ8 (GLOVE) ×2 IMPLANT
GLOVE BIOGEL PI IND STRL 7.0 (GLOVE) ×2 IMPLANT
GLOVE BIOGEL PI IND STRL 8 (GLOVE) ×1 IMPLANT
GLOVE BIOGEL PI INDICATOR 7.0 (GLOVE) ×2
GLOVE BIOGEL PI INDICATOR 8 (GLOVE) ×1
GOWN STRL REUS W/ TWL LRG LVL3 (GOWN DISPOSABLE) ×1 IMPLANT
GOWN STRL REUS W/ TWL XL LVL3 (GOWN DISPOSABLE) ×1 IMPLANT
GOWN STRL REUS W/TWL LRG LVL3 (GOWN DISPOSABLE) ×1
GOWN STRL REUS W/TWL XL LVL3 (GOWN DISPOSABLE) ×1
INTERLOCK LRDTC CRVCL VBR 6MM (Bone Implant) ×1 IMPLANT
IV CATH 14GX2 1/4 (CATHETERS) ×2 IMPLANT
KIT BASIN OR (CUSTOM PROCEDURE TRAY) ×2 IMPLANT
KIT TURNOVER KIT B (KITS) ×2 IMPLANT
LORDOTIC CERVICAL VBR 6MM SM (Bone Implant) ×2 IMPLANT
MANIFOLD NEPTUNE II (INSTRUMENTS) ×2 IMPLANT
NEEDLE PRECISIONGLIDE 27X1.5 (NEEDLE) ×2 IMPLANT
NEEDLE SPNL 20GX3.5 QUINCKE YW (NEEDLE) ×2 IMPLANT
NS IRRIG 1000ML POUR BTL (IV SOLUTION) ×2 IMPLANT
OIL CARTRIDGE MAESTRO DRILL (MISCELLANEOUS) ×2
PACK ORTHO CERVICAL (CUSTOM PROCEDURE TRAY) ×2 IMPLANT
PAD ARMBOARD 7.5X6 YLW CONV (MISCELLANEOUS) ×4 IMPLANT
PATTIES SURGICAL .5 X.5 (GAUZE/BANDAGES/DRESSINGS) IMPLANT
PATTIES SURGICAL .5 X1 (DISPOSABLE) ×2 IMPLANT
PIN DISTRACTION 14 (PIN) ×4 IMPLANT
PLATE SKYLINE 12MM (Plate) ×2 IMPLANT
POSITIONER HEAD DONUT 9IN (MISCELLANEOUS) ×2 IMPLANT
SCREW SKYLINE VAR OS 14MM (Screw) ×8 IMPLANT
SPONGE INTESTINAL PEANUT (DISPOSABLE) ×2 IMPLANT
SPONGE SURGIFOAM ABS GEL 100 (HEMOSTASIS) IMPLANT
STRIP CLOSURE SKIN 1/2X4 (GAUZE/BANDAGES/DRESSINGS) ×2 IMPLANT
SURGIFLO W/THROMBIN 8M KIT (HEMOSTASIS) IMPLANT
SUT MNCRL AB 4-0 PS2 18 (SUTURE) ×2 IMPLANT
SUT SILK 4 0 (SUTURE) ×1
SUT SILK 4-0 18XBRD TIE 12 (SUTURE) ×1 IMPLANT
SUT VIC AB 2-0 CT2 18 VCP726D (SUTURE) ×2 IMPLANT
SYR BULB IRRIG 60ML STRL (SYRINGE) ×2 IMPLANT
SYR CONTROL 10ML LL (SYRINGE) ×4 IMPLANT
TAPE CLOTH 4X10 WHT NS (GAUZE/BANDAGES/DRESSINGS) ×2 IMPLANT
TAPE UMBILICAL COTTON 1/8X30 (MISCELLANEOUS) ×2 IMPLANT
TOWEL GREEN STERILE (TOWEL DISPOSABLE) ×2 IMPLANT
TOWEL GREEN STERILE FF (TOWEL DISPOSABLE) ×2 IMPLANT
WATER STERILE IRR 1000ML POUR (IV SOLUTION) ×2 IMPLANT
YANKAUER SUCT BULB TIP NO VENT (SUCTIONS) ×2 IMPLANT

## 2019-11-09 NOTE — H&P (Signed)
PREOPERATIVE H&P  Chief Complaint: Balance deterioration  HPI: Kenneth Daniel is a 74 y.o. 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 Acid reflux   . Borderline anemia   . Hearing loss    wears hearing aids  . HLD (hyperlipidemia)   . Hypertension   . Prostate cancer ([place])   . Wears glasses    Past Surgical History:  Procedure Laterality Date  . COLONOSCOPY  2020  . cyst removed     back- benign  . PROSTATECTOMY  2004  . UPPER GI ENDOSCOPY  2021   Social History   Socioeconomic History  . Marital status: Single    Spouse name: Not on file  . Number of children: Not on file  . Years of education: Not on file  . Highest education level: Not on file  Occupational History  . Not on file  Tobacco Use  . Smoking status: Former Smoker    Types: Cigarettes    Quit date: 1974    Years since quitting: 47.5  . Smokeless tobacco: Never Used  Vaping Use  . Vaping Use: Never used  Substance and Sexual Activity  . Alcohol use: Never  . Drug use: Never  . Sexual activity: Not Currently  Other Topics Concern  . Not on file  Social History Narrative  . Not on file   Social Determinants of Health   Financial Resource Strain:   . Difficulty of Paying Living Expenses:   Food Insecurity:   . Worried About Charity fundraiser in the Last Year:   . Arboriculturist in the Last Year:   Transportation Needs:   . Film/video editor (Medical):   Marland Kitchen Lack of Transportation (Non-Medical):   Physical Activity:   . Days of Exercise per Week:   . Minutes of Exercise per Session:   Stress:   . Feeling of Stress :   Social Connections:   . Frequency of Communication with Friends and Family:   .  Frequency of Social Gatherings with Friends and Family:   . Attends Religious Services:   . Active Member of Clubs or Organizations:   . Attends Archivist Meetings:   Marland Kitchen Marital Status:    History reviewed. No pertinent family history. No Known Allergies Prior to Admission medications   Medication Sig Start Date End Date Taking? Authorizing Provider  acetaminophen (TYLENOL) 500 MG tablet Take 1,000 mg by mouth every 8 (eight) hours as needed for moderate pain.   Yes [provider]  aluminum hydroxide-magnesium carbonate (GAVISCON) 95-358 MG/15ML SUSP Take 30 mLs by mouth as needed for indigestion or heartburn.   Yes [provider]  amLODipine (NORVASC) 10 MG tablet Take 10 mg by mouth at bedtime.   Yes [provider]  ascorbic acid (VITAMIN C) 500 MG tablet Take 500 mg by mouth daily.   Yes [provider]  aspirin EC 81 MG tablet Take 81 mg by mouth at bedtime. Swallow whole.   Yes [provider]  atorvastatin (LIPITOR) 40 MG tablet Take 40 mg by mouth at bedtime.   Yes [provider]  Azelastine HCl 0.15 % SOLN Place 1 spray into both nostrils 2 (two) times daily as  needed for rhinitis. Use in each nostril as directed    Yes [provider]  b complex vitamins tablet Take 1 tablet by mouth daily.   Yes [provider]  cholecalciferol (VITAMIN D3) 25 MCG (1000 UNIT) tablet Take 1,000 Units by mouth daily.   Yes [provider]  famotidine (PEPCID) 40 MG tablet Take 40 mg by mouth at bedtime.   Yes [provider]  Magnesium 250 MG TABS Take 250 mg by mouth daily.   Yes [provider]  Nebivolol HCl (BYSTOLIC) 20 MG TABS Take 20 mg by mouth daily.   Yes [provider]  Nutritional Supplements (JUICE PLUS FIBRE PO) Take 2 capsules by mouth daily.   Yes [provider]  Probiotic Product (PROBIOTIC DAILY PO) Take 1 capsule by mouth daily.   Yes [provider]  quinapril (ACCUPRIL) 40 MG tablet Take 40 mg by mouth at bedtime.   Yes [provider]  sacubitril-valsartan (ENTRESTO) 24-26 MG Take 1 tablet by mouth 2 (two) times daily.   Yes [provider]  zinc gluconate 50 MG tablet Take 50 mg by mouth daily.   Yes [provider]     All other systems have been reviewed and were otherwise negative with the exception of those mentioned in the HPI and as above.  Physical Exam: Vitals:   11/09/19 0704  BP: (!) 154/85  Pulse: 92  Resp: 18  Temp: 97.6 F (36.4 C)  SpO2: 99%    Body mass index is 21.99 kg/m.  General: Alert, no acute distress Cardiovascular: No pedal edema Respiratory: No cyanosis, no use of accessory musculature Skin: No lesions in the area of chief complaint Neurologic: Sensation intact distally Psychiatric: Patient is competent for consent with normal mood and affect Lymphatic: No axillary or cervical lymphadenopathy   Assessment/Plan: CERVICAL MYELOPATHY Plan for Procedure(s): ANTERIOR CERVICAL DECOMPRESSION FUSION CERVICAL 3-4 WITH INSTRUMENTATION AND ALLOGRAFT   Norva Karvonen, MD 11/09/2019 7:14 AM

## 2019-11-09 NOTE — Anesthesia Postprocedure Evaluation (Signed)
Anesthesia Post Note  Patient: Kenneth Daniel  Procedure(s) Performed: ANTERIOR CERVICAL DECOMPRESSION FUSION CERVICAL 3-4 WITH INSTRUMENTATION AND ALLOGRAFT (N/A Spine Cervical)     Patient location during evaluation: PACU Anesthesia Type: General Level of consciousness: awake and alert Pain management: pain level controlled Vital Signs Assessment: post-procedure vital signs reviewed and stable Respiratory status: spontaneous breathing, nonlabored ventilation, respiratory function stable and patient connected to nasal cannula oxygen Cardiovascular status: blood pressure returned to baseline and stable Postop Assessment: no apparent nausea or vomiting Anesthetic complications: no   No complications documented.  Last Vitals:  Vitals:   11/09/19 1015 11/09/19 1048  BP: (!) 159/95 (!) 159/99  Pulse: 92 90  Resp: 12 20  Temp:  37 C  SpO2: 93% 97%    Last Pain:  Vitals:   11/09/19 1055  TempSrc:   PainSc: 0-No pain                 Sarra Rachels S

## 2019-11-09 NOTE — Evaluation (Signed)
Physical Therapy Evaluation Patient Details Name: Kenneth Daniel MRN: [id number] DOB: 07/23/1945 Today's Date: 11/09/2019   History of Present Illness  Pt is a 74 yo male presenting s/p ACDF C3-4 on 7/8 due to ongoing balance deficits and pain due to cord compression at C3/4. PMH includes: HLD and HTN.  Clinical Impression  Pt in bed upon arrival of PT, agreeable to evaluation at this time. Prior to admission the pt was independent with all mobility, and living alone in a first-floor apt. The pt now presents with limitations in functional mobility, stability, strength, and coordination due to above dx, and will continue to benefit from skilled PT to address these deficits. The pt was able to demo good hallway ambulation with RW and supervision, but demos sig instability without use of AD at this time. Despite the long ambulation, the pt also demonstrated difficulties with safety awareness and planning to reduce risk of falls with mobility, and is not safe to return home without supervision at this time due to increased risk of falls. The pt will continue to benefit from skilled PT to further progress functional mobility, stability, and facilitate return to independence.      Follow Up Recommendations SNF;Supervision/Assistance - 24 hour (after today's session, pt unsure of if anyone can provide supervision/assist at home and therefore he is not safe to return home without supervision. If 24/7 supervision can be arranged, HHPT would be appropriate)    Equipment Recommendations  Rolling walker with 5" wheels;3in1 (PT)    Recommendations for Other Services       Precautions / Restrictions Precautions Precautions: Cervical Precaution Comments: verbally reviewed with the pt and his sisters Required Braces or Orthoses: Cervical Brace Cervical Brace: Soft collar;At all times (not in the shower) Restrictions Weight Bearing Restrictions: No      Mobility  Bed Mobility Overal bed mobility:  Modified Independent                Transfers Overall transfer level: Needs assistance Equipment used: Rolling walker (2 wheeled) Transfers: Sit to/from Stand Sit to Stand: Min guard         General transfer comment: cues for hand placement, minG for safety  Ambulation/Gait Ambulation/Gait assistance: Min guard;Min assist Gait Distance (Feet): 200 Feet Assistive device: Rolling walker (2 wheeled);None   Gait velocity: 0.19 m/s Gait velocity interpretation: <1.31 ft/sec, indicative of household ambulator General Gait Details: pt completed most mobility with RW and minG, but when trialed without AD, the pt requires minA to maintain and quickly transitions to short, staggering steps with sig increased assist needed to maintain balance.      Balance Overall balance assessment: Needs assistance   Sitting balance-Leahy Scale: Good     Standing balance support: Bilateral upper extremity supported;During functional activity Standing balance-Leahy Scale: Poor Standing balance comment: pt with LOB in static stand without BUE supported                             Pertinent Vitals/Pain Pain Assessment: Faces Faces Pain Scale: Hurts a little bit Pain Location: neck Pain Descriptors / Indicators: Grimacing Pain Intervention(s): Limited activity within patient's tolerance;Monitored during session;Repositioned    Home Living Family/patient expects to be discharged to:: Private residence Living Arrangements: Alone Available Help at Discharge: Available PRN/intermittently (pt reports he lives home alone, has a sister that lives in the city, but she takes care of another disabled family member. Pt currently unsure about anyone to provide  supervision following d/c) Type of Home: Apartment Home Access: Level entry (one curb between parking area and door)     Home Layout: One level Home Equipment: None Additional Comments: pt previously independent    Prior Function  Level of Independence: Independent               Hand Dominance        Extremity/Trunk Assessment   Upper Extremity Assessment Upper Extremity Assessment: LUE deficits/detail;RUE deficits/detail (bilateral decreased shoulder strength in flex and abd) RUE Coordination: decreased fine motor LUE Sensation: decreased light touch LUE Coordination: decreased fine motor    Lower Extremity Assessment Lower Extremity Assessment: Generalized weakness    Cervical / Trunk Assessment Cervical / Trunk Assessment: Normal (cervical surgery)  Communication   Communication: HOH (pt with hearing aides)  Cognition Arousal/Alertness: Awake/alert Behavior During Therapy: WFL for tasks assessed/performed Overall Cognitive Status: Impaired/Different from baseline Area of Impairment: Memory;Safety/judgement;Problem solving                     Memory: Decreased recall of precautions;Decreased short-term memory   Safety/Judgement: Decreased awareness of safety   Problem Solving: Slow processing;Difficulty sequencing;Requires verbal cues General Comments: Pt generally functional in conversation, but with noted difficulty assessing and adjusting to safety concerns with mobility, pt is aware of deficits, but requires cues to adjust      General Comments General comments (skin integrity, edema, etc.): pt sisters present and supportive    Exercises     Assessment/Plan    PT Assessment Patient needs continued PT services  PT Problem List Decreased strength;Decreased mobility;Decreased safety awareness;Decreased coordination;Decreased knowledge of precautions;Decreased activity tolerance;Decreased balance;Decreased knowledge of use of DME       PT Treatment Interventions DME instruction;Gait training;Stair training;Functional mobility training;Therapeutic activities;Therapeutic exercise;Balance training;Patient/family education    PT Goals (Current goals can be found in the Care Plan  section)  Acute Rehab PT Goals Patient Stated Goal: improve balance so he can live by himself again PT Goal Formulation: With patient Time For Goal Achievement: 11/23/19 Potential to Achieve Goals: Good    Frequency Min 5X/week   Barriers to discharge Decreased caregiver support pt reports he is currently unsure of supervision/assistance at home       AM-PAC PT "6 Clicks" Mobility  Outcome Measure Help needed turning from your back to your side while in a flat bed without using bedrails?: None Help needed moving from lying on your back to sitting on the side of a flat bed without using bedrails?: None Help needed moving to and from a bed to a chair (including a wheelchair)?: A Little Help needed standing up from a chair using your arms (e.g., wheelchair or bedside chair)?: A Little Help needed to walk in hospital room?: A Little Help needed climbing 3-5 steps with a railing? : A Little 6 Click Score: 20    End of Session Equipment Utilized During Treatment: Gait belt;Cervical collar Activity Tolerance: Patient tolerated treatment well Patient left: in bed;with family/visitor present Nurse Communication: Mobility status PT Visit Diagnosis: Difficulty in walking, not elsewhere classified (R26.2);Unsteadiness on feet (R26.81)    Time: 8338-2505 PT Time Calculation (min) (ACUTE ONLY): 33 min   Charges:   PT Evaluation $PT Eval Moderate Complexity: 1 Mod PT Treatments $Gait Training: 8-22 mins        Karma Ganja, PT, DPT   Acute Rehabilitation Department Pager #: [phone]  Otho Bellows 11/09/2019, 6:19 PM

## 2019-11-09 NOTE — Op Note (Signed)
PATIENT NAME: Kenneth Daniel   MEDICAL RECORD NO.:   [id number]    DATE OF BIRTH: 1945-06-24   DATE OF PROCEDURE: 11/09/2019                               OPERATIVE REPORT     PREOPERATIVE DIAGNOSES: 1. Progressive cervical myelopathy 2. 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   POSTOPERATIVE DIAGNOSES: 1. Progressive cervical myelopathy 2. 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 Anterior cervical decompression and fusion C3/4 2. Placement of anterior instrumentation, C3/4 3. Insertion of interbody device x1 (74mm Titan intervertebral spacer). 4. Intraoperative use of fluoroscopy. 5. Use of morselized allograft - ViviGen.   SURGEON:  Phylliss Bob, MD   ASSISTANT:  Pricilla Holm, PA-C.   ANESTHESIA:  General endotracheal anesthesia.   COMPLICATIONS:  None.   DISPOSITION:  Stable.   ESTIMATED BLOOD LOSS:  Minimal.   INDICATIONS FOR SURGERY:  Briefly, Kenneth Daniel is a pleasant 74 -year- old male, who did present to me with progressive cervical myelopathy.    The patient's MRI did reveal the findings noted above.  Given the Ongoing symptoms and MRI findings, we did discuss proceeding with the procedure noted above.  The patient was fully aware of the risks and limitations of surgery as outlined in my preoperative note.   OPERATIVE DETAILS:  On 11/09/2019, the patient was brought to surgery and general endotracheal anesthesia was administered.  The patient was placed supine on the hospital bed. The neck was gently extended.  All bony prominences were meticulously padded.  The neck was prepped and draped in the usual sterile fashion.  At this point, I did make a left-sided transverse incision.  The platysma was incised.  A Smith-Robinson approach was used and the anterior spine was identified. A self-retaining retractor was placed.  I then subperiosteally exposed the vertebral bodies from C3-C4.  Caspar pins were then placed into the C3 and C4 vertebral bodies and distraction was  applied.  A thorough and complete C3-4 intervertebral diskectomy was performed.  The posterior longitudinal ligament was identified and entered using a nerve hook.  I then used #1 followed by #2 Kerrison to perform a thorough and complete intervertebral diskectomy.  The spinal canal was thoroughly decompressed.  The endplates were then prepared and the appropriate-sized intervertebral spacer was then packed with ViviGen and tamped into position in the usual fashion. The Caspar pins  then were removed and bone wax was placed in their place.  The appropriate-sized anterior cervical plate was placed over the anterior spine.  14 mm variable angle screws were placed, 2 in each vertebral body from C3-C4 for a total of 4 vertebral body screws.  The screws were then locked to the plate using the Cam locking mechanism.  I was very pleased with the final fluoroscopic images.  The wound was then irrigated.  The wound was then explored for any undue bleeding and there was no bleeding noted. The wound was then closed in layers using 2-0 Vicryl, followed by 4-0 Monocryl.  Benzoin and Steri-Strips were applied, followed by sterile dressing.  All instrument counts were correct at the termination of the procedure.   Of note, Pricilla Holm, PA-C, was my assistant throughout surgery, and did aid in retraction, placement of the hardware, suctioning, and closure from start to finish.       Phylliss Bob, MD

## 2019-11-09 NOTE — Transfer of Care (Signed)
Immediate Anesthesia Transfer of Care Note  Patient: Kenneth Daniel  Procedure(s) Performed: ANTERIOR CERVICAL DECOMPRESSION FUSION CERVICAL 3-4 WITH INSTRUMENTATION AND ALLOGRAFT (N/A Spine Cervical)  Patient Location: PACU  Anesthesia Type:General  Level of Consciousness: awake  Airway & Oxygen Therapy: Patient Spontanous Breathing  Post-op Assessment: Report given to RN and Post -op Vital signs reviewed and stable  Post vital signs: Reviewed and stable  Last Vitals:  Vitals Value Taken Time  BP 143/94 11/09/19 0945  Temp 36.1 C 11/09/19 0945  Pulse 87 11/09/19 0948  Resp 12 11/09/19 0948  SpO2 90 % 11/09/19 0948  Vitals shown include unvalidated device data.  Last Pain:  Vitals:   11/09/19 0945  TempSrc:   PainSc: (P) Asleep      Patients Stated Pain Goal: 3 (97/41/63 8453)  Complications: No complications documented.

## 2019-11-09 NOTE — Anesthesia Procedure Notes (Signed)
Procedure Name: Intubation Performed by: Milford Cage, CRNA Pre-anesthesia Checklist: Patient identified, Emergency Drugs available, Suction available and Patient being monitored Patient Re-evaluated:Patient Re-evaluated prior to induction Oxygen Delivery Method: Circle System Utilized Preoxygenation: Pre-oxygenation with 100% oxygen Induction Type: IV induction Ventilation: Mask ventilation without difficulty Laryngoscope Size: Mac and 4 Grade View: Grade I Tube type: Oral Tube size: 7.5 mm Number of attempts: 1 Airway Equipment and Method: Stylet and Oral airway Placement Confirmation: ETT inserted through vocal cords under direct vision,  positive ETCO2 and breath sounds checked- equal and bilateral Secured at: 23 cm Tube secured with: Tape Dental Injury: Teeth and Oropharynx as per pre-operative assessment  Comments: Performed by Paramedic Student

## 2019-11-10 DIAGNOSIS — M5001 Cervical disc disorder with myelopathy,  high cervical region: Secondary | ICD-10-CM | POA: Diagnosis not present

## 2019-11-10 MED ORDER — QUINAPRIL HCL 10 MG PO TABS
40.0000 mg | ORAL_TABLET | Freq: Every day | ORAL | Status: DC
  Administered 2019-11-10: 40 mg via ORAL
  Filled 2019-11-10: qty 4

## 2019-11-10 MED FILL — Thrombin (Recombinant) For Soln 20000 Unit: CUTANEOUS | Qty: 1 | Status: AC

## 2019-11-10 NOTE — Evaluation (Signed)
Occupational Therapy Evaluation Patient Details Name: Kenneth Daniel MRN: [id number] DOB: 02-12-1946 Today's Date: 11/10/2019    History of Present Illness Pt is a 74 yo male presenting s/p ACDF C3-4 on 7/8 due to ongoing balance deficits and pain due to cord compression at C3/4. PMH includes: HLD and HTN.   Clinical Impression   PTA patient was living independently in a ground level apartment. Due to the above, patient with Hx of recurrent falls and decreased balance warranting surgical intervention. Patient currently functioning below baseline level requiring supervision to set-up assist grossly for UB BADLs and Min guard to Min A grossly for LB BADLs with use of RW. Patient/family education on safety with bed mobility, functional transfers with use of RW, and self-care tasks for adherance to cervical precautions. Patient/family expressing verbal understanding. Patient's sisters present at bedside declining previous recommendation for SNF rehab noting that patient now has 24 hour supervision/assist post d/c. Patient does not require any further acute OT services at this time with recommendation for HHOT to maximize safety and independence with BADLs.     Follow Up Recommendations  Home health OT;Supervision/Assistance - 24 hour    Equipment Recommendations  3 in 1 bedside commode    Recommendations for Other Services       Precautions / Restrictions Precautions Precautions: Cervical Precaution Booklet Issued: Yes (comment) Required Braces or Orthoses: Cervical Brace Cervical Brace: Soft collar;At all times Restrictions Weight Bearing Restrictions: No      Mobility Bed Mobility Overal bed mobility: Modified Independent             General bed mobility comments: Patient able to return demonstrate log rolling technique for supine <> EOB  Transfers Overall transfer level: Needs assistance Equipment used: Rolling walker (2 wheeled) Transfers: Sit to/from Stand Sit to Stand:  Min guard         General transfer comment: Cues for hand placement and posture    Balance Overall balance assessment: Needs assistance   Sitting balance-Leahy Scale: Good     Standing balance support: Bilateral upper extremity supported;During functional activity Standing balance-Leahy Scale: Poor Standing balance comment: No LOB with LB dressing this date.                            ADL either performed or assessed with clinical judgement   ADL Overall ADL's : Needs assistance/impaired     Grooming: Min guard;Standing   Upper Body Bathing: Set up;Sitting   Lower Body Bathing: Minimal assistance;Sit to/from stand   Upper Body Dressing : Set up;Sitting   Lower Body Dressing: Min guard;Sit to/from stand Lower Body Dressing Details (indicate cue type and reason): Able to attain and maintain figure-4 position to don LB clothing. Min A to maintain standing balance with hiking pants over hips.  Toilet Transfer: Min guard;Minimal assistance;Ambulation;RW                   Vision Baseline Vision/History: Wears glasses Wears Glasses: At all times Patient Visual Report: No change from baseline Vision Assessment?: No apparent visual deficits     Perception     Praxis      Pertinent Vitals/Pain Pain Assessment: No/denies pain     Hand Dominance Right   Extremity/Trunk Assessment Upper Extremity Assessment Upper Extremity Assessment: Generalized weakness RUE Coordination: WNL LUE Deficits / Details: Patient able to button shirt without external assist.   Lower Extremity Assessment Lower Extremity Assessment: Defer to PT evaluation  Cervical / Trunk Assessment Cervical / Trunk Assessment: Other exceptions (s/p cervical sx)   Communication Communication Communication: HOH (w/ bilateral hearing aids)   Cognition Arousal/Alertness: Awake/alert Behavior During Therapy: WFL for tasks assessed/performed Overall Cognitive Status: Impaired/Different  from baseline Area of Impairment: Memory;Safety/judgement;Problem solving                     Memory: Decreased recall of precautions;Decreased short-term memory   Safety/Judgement: Decreased awareness of safety   Problem Solving: Slow processing;Difficulty sequencing;Requires verbal cues     General Comments  Sisters present at bedside.     Exercises     Shoulder Instructions      Home Living Family/patient expects to be discharged to:: Private residence Living Arrangements: Alone Available Help at Discharge: Family;Available 24 hours/day Type of Home: Apartment Home Access: Level entry     Home Layout: One level     Bathroom Shower/Tub: Teacher, early years/pre: Standard Bathroom Accessibility: Yes How Accessible: Accessible via walker Home Equipment: Tub bench (Can borrow TTB from family. )          Prior Functioning/Environment Level of Independence: Independent        Comments: BADLs/IADLs        OT Problem List: Decreased strength;Impaired balance (sitting and/or standing);Decreased cognition;Decreased safety awareness;Decreased knowledge of use of DME or AE;Decreased knowledge of precautions      OT Treatment/Interventions:      OT Goals(Current goals can be found in the care plan section) Acute Rehab OT Goals Patient Stated Goal: To be able to do things for himself.   OT Frequency:     Barriers to D/C:            Co-evaluation              AM-PAC OT "6 Clicks" Daily Activity     Outcome Measure Help from another person eating meals?: None Help from another person taking care of personal grooming?: A Little Help from another person toileting, which includes using toliet, bedpan, or urinal?: A Little Help from another person bathing (including washing, rinsing, drying)?: A Little Help from another person to put on and taking off regular upper body clothing?: A Little Help from another person to put on and taking off  regular lower body clothing?: A Little 6 Click Score: 19   End of Session Equipment Utilized During Treatment: Gait belt;Rolling walker  Activity Tolerance: Patient tolerated treatment well Patient left: in bed;with call bell/phone within reach;with family/visitor present  OT Visit Diagnosis: Unsteadiness on feet (R26.81);History of falling (Z91.81)                Time: 6962-9528 OT Time Calculation (min): 25 min Charges:  OT General Charges $OT Visit: 1 Visit OT Evaluation $OT Eval Low Complexity: 1 Low OT Treatments $Self Care/Home Management : 8-22 mins  Kehaulani Fruin H. OTR/L Supplemental OT, Department of rehab services [phone]  Yue Glasheen R H. 11/10/2019, 9:28 AM

## 2019-11-10 NOTE — Progress Notes (Signed)
    Patient doing well  Denies arm pain Tolerating PO well   Physical Exam: Vitals:   11/09/19 2336 11/10/19 0325  BP: (!) 175/94 (!) 154/86  Pulse: 93 91  Resp: 20 18  Temp:  97.8 F (36.6 C)  SpO2: 98% 98%    Neck soft/supple Dressing in place NVI  POD #1 s/p ACDF, doing well  - encourage ambulation - Percocet for pain, Robaxin for muscle spasms - d/c home later today

## 2019-11-10 NOTE — Progress Notes (Signed)
Physical Therapy Treatment Patient Details Name: Kenneth Daniel MRN: [id number] DOB: 1945-07-27 Today's Date: 11/10/2019    History of Present Illness Pt is a 74 yo male presenting s/p ACDF C3-4 on 7/8 due to ongoing balance deficits and pain due to cord compression at C3/4. PMH includes: HLD and HTN.    PT Comments    Pt progressing well with post-op mobility. He was able to demonstrate transfers and ambulation with up to no AD. SPC seemed to increase frequency of unsteadiness as pt had difficulty with the sequencing aspect of using the cane. Extensive discussion with pt and sisters in regards to safety, DME recommendations, and problem solving home situations. Pt was also educated on precautions, brace application/wearing schedule, appropriate activity progression, and car transfer. Will continue to follow.      Follow Up Recommendations  Home health PT;Supervision for mobility/OOB     Equipment Recommendations  Rolling walker with 5" wheels;3in1 (PT);Other (comment) (Pt wants tub bench and SPC in addition to above DME)    Recommendations for Other Services       Precautions / Restrictions Precautions Precautions: Cervical Precaution Booklet Issued: Yes (comment) Precaution Comments: verbally reviewed with the pt and his sisters Required Braces or Orthoses: Cervical Brace Cervical Brace: Soft collar;At all times Restrictions Weight Bearing Restrictions: No    Mobility  Bed Mobility Overal bed mobility: Modified Independent             General bed mobility comments: VC's from therapist and sisters regarding log roll as pt sat straight up in bed upon PT arrival. HOB slightly elevated and rails lowered to simulate home environment.   Transfers Overall transfer level: Needs assistance Equipment used: Rolling walker (2 wheeled);Straight cane Transfers: Sit to/from Stand Sit to Stand: Supervision         General transfer comment: Close supervision for safety. VC's for  hand placement on seated surface for safety with [facility]  Ambulation/Gait Ambulation/Gait assistance: Modified independent (Device/Increase time);Supervision;Min guard Gait Distance (Feet): 400 Feet Assistive device: Rolling walker (2 wheeled);None;Straight cane Gait Pattern/deviations: Step-through pattern;Decreased stride length;Narrow base of support Gait velocity: Decreased Gait velocity interpretation: <1.31 ft/sec, indicative of household ambulator General Gait Details: Initially started out with the RW and at a mod I level. Progressed to no AD and pt requiring supervision for safety. Overall appeared steady with occasional mild unsteadiness but able to correct without difficulty or assist. Trialed the cane, however feel the sequencing aspect of use resulted in increased frequency of unsteadiness episodes. Again, very mild and no assist was required, however feel he looked the best without an AD   Stairs             Wheelchair Mobility    Modified [name] (Stroke Patients Only)       Balance Overall balance assessment: Needs assistance   Sitting balance-Leahy Scale: Good     Standing balance support: Bilateral upper extremity supported;During functional activity Standing balance-Leahy Scale: Fair Standing balance comment: No LOB with LB dressing this date.                             Cognition Arousal/Alertness: Awake/alert Behavior During Therapy: WFL for tasks assessed/performed Overall Cognitive Status: Impaired/Different from baseline Area of Impairment: Memory;Safety/judgement;Problem solving                     Memory: Decreased recall of precautions;Decreased short-term memory   Safety/Judgement: Decreased awareness of safety  Problem Solving: Slow processing;Difficulty sequencing;Requires verbal cues        Exercises      General Comments General comments (skin integrity, edema, etc.): pt sisters present and supportive       Pertinent Vitals/Pain Pain Assessment: Faces Faces Pain Scale: No hurt Pain Intervention(s): Monitored during session    Home Living Family/patient expects to be discharged to:: Private residence Living Arrangements: Alone Available Help at Discharge: Family;Available 24 hours/day Type of Home: Apartment Home Access: Level entry   Home Layout: One level Home Equipment: Tub bench (Can borrow TTB from family. )      Prior Function Level of Independence: Independent      Comments: BADLs/IADLs   PT Goals (current goals can now be found in the care plan section) Acute Rehab PT Goals Patient Stated Goal: To be able to do things for himself.  PT Goal Formulation: With patient Time For Goal Achievement: 11/23/19 Potential to Achieve Goals: Good Progress towards PT goals: Progressing toward goals    Frequency    Min 5X/week      PT Plan Current plan remains appropriate    Co-evaluation              AM-PAC PT "6 Clicks" Mobility   Outcome Measure  Help needed turning from your back to your side while in a flat bed without using bedrails?: None Help needed moving from lying on your back to sitting on the side of a flat bed without using bedrails?: None Help needed moving to and from a bed to a chair (including a wheelchair)?: None Help needed standing up from a chair using your arms (e.g., wheelchair or bedside chair)?: None Help needed to walk in hospital room?: None Help needed climbing 3-5 steps with a railing? : A Little 6 Click Score: 23    End of Session Equipment Utilized During Treatment: Gait belt;Cervical collar Activity Tolerance: Patient tolerated treatment well Patient left: in bed;with family/visitor present Nurse Communication: Mobility status PT Visit Diagnosis: Difficulty in walking, not elsewhere classified (R26.2);Unsteadiness on feet (R26.81)     Time: 9021-1155 PT Time Calculation (min) (ACUTE ONLY): 36 min  Charges:  $Gait Training:  23-37 mins                     Rolinda Roan, PT, DPT Acute Rehabilitation Services Pager: [phone] Office: [phone]    Thelma Comp 11/10/2019, 12:56 PM

## 2019-11-10 NOTE — Plan of Care (Signed)
Patient alert and oriented, mae's well, voiding adequate amount of urine, swallowing without difficulty, no c/o pain at time of discharge. Patient discharged home with family. Script and discharged instructions given to patient. Patient and family stated understanding of instructions given. Patient has an appointment with Dr. Dumonski 

## 2019-11-12 ENCOUNTER — Encounter (HOSPITAL_COMMUNITY): Payer: Self-pay | Admitting: Orthopedic Surgery

## 2019-11-14 ENCOUNTER — Ambulatory Visit: Payer: Medicare PPO | Admitting: Internal Medicine

## 2019-11-22 NOTE — Discharge Summary (Signed)
Patient ID: Kenneth Daniel MRN: [id number] DOB/AGE: 1945/12/16 74 y.o.  Admit date: 11/09/2019 Discharge date: 11/10/2019  Admission Diagnoses:  Active Problems:   Myelopathy [facility])   Discharge Diagnoses:  Same  Past Medical History:  Diagnosis Date   Acid reflux    Borderline anemia    Hearing loss    wears hearing aids   HLD (hyperlipidemia)    Hypertension    Prostate cancer ([place])    Wears glasses     Surgeries: Procedure(s): ANTERIOR CERVICAL DECOMPRESSION FUSION CERVICAL 3-4 WITH INSTRUMENTATION AND ALLOGRAFT on 11/09/2019   Consultants: None  Discharged Condition: Improved  Hospital Course: Kenneth Daniel is an 74 y.o. male who was admitted 11/09/2019 for operative treatment of myelopathy. Patient has severe unremitting pain that affects sleep, daily activities, and work/hobbies. After pre-op clearance the patient was taken to the operating room on 11/09/2019 and underwent  Procedure(s): ANTERIOR CERVICAL DECOMPRESSION FUSION CERVICAL 3-4 WITH INSTRUMENTATION AND ALLOGRAFT.    Patient was given perioperative antibiotics:  Anti-infectives (From admission, onward)   Start     Dose/Rate Route Frequency Ordered Stop   11/09/19 1600  ceFAZolin (ANCEF) IVPB 2g/100 mL premix        2 g 200 mL/hr over 30 Minutes Intravenous Every 8 hours 11/09/19 1039 11/09/19 2340   11/09/19 0600  ceFAZolin (ANCEF) IVPB 2g/100 mL premix        2 g 200 mL/hr over 30 Minutes Intravenous On call to O.R. 11/09/19 0550 11/09/19 0750       Patient was given sequential compression devices, early ambulation to prevent DVT.  Patient benefited maximally from hospital stay and there were no complications.    Recent vital signs: BP (!) 143/87 (BP Location: Left Arm)    Pulse 93    Temp 98.3 F (36.8 C) (Oral)    Resp 19    Ht 6\' 2"  (1.88 m)    Wt 77.7 kg    SpO2 96%    BMI 21.99 kg/m    Discharge Medications:   Allergies as of 11/10/2019   No Known Allergies     Medication List      TAKE these medications   acetaminophen 500 MG tablet Commonly known as: TYLENOL Take 1,000 mg by mouth every 8 (eight) hours as needed for moderate pain.   aluminum hydroxide-magnesium carbonate 95-358 MG/15ML Susp Commonly known as: GAVISCON Take 30 mLs by mouth as needed for indigestion or heartburn.   amLODipine 10 MG tablet Commonly known as: NORVASC Take 10 mg by mouth at bedtime.   ascorbic acid 500 MG tablet Commonly known as: VITAMIN C Take 500 mg by mouth daily.   atorvastatin 40 MG tablet Commonly known as: LIPITOR Take 40 mg by mouth at bedtime.   Azelastine HCl 0.15 % Soln Place 1 spray into both nostrils 2 (two) times daily as needed for rhinitis. Use in each nostril as directed   b complex vitamins tablet Take 1 tablet by mouth daily.   Bystolic 20 MG Tabs Generic drug: Nebivolol HCl Take 20 mg by mouth daily.   cholecalciferol 25 MCG (1000 UNIT) tablet Commonly known as: VITAMIN D3 Take 1,000 Units by mouth daily.   famotidine 40 MG tablet Commonly known as: PEPCID Take 40 mg by mouth at bedtime.   JUICE PLUS FIBRE PO Take 2 capsules by mouth daily.   Magnesium 250 MG Tabs Take 250 mg by mouth daily.   methocarbamol 500 MG tablet Commonly known as: ROBAXIN Take 1 tablet (  500 mg total) by mouth every 6 (six) hours as needed for muscle spasms.   oxyCODONE-acetaminophen 5-325 MG tablet Commonly known as: PERCOCET/ROXICET Take 1-2 tablets by mouth every 4 (four) hours as needed for moderate pain or severe pain.   PROBIOTIC DAILY PO Take 1 capsule by mouth daily.   quinapril 40 MG tablet Commonly known as: ACCUPRIL Take 40 mg by mouth at bedtime.   zinc gluconate 50 MG tablet Take 50 mg by mouth daily.       Diagnostic Studies: DG Cervical Spine 2-3 Views  Result Date: 11/09/2019 CLINICAL DATA:  Anterior cervical discectomy and fusion C3-C4 EXAM: CERVICAL SPINE - 2-3 VIEW; DG C-ARM 1-60 MIN COMPARISON:  MR cervical spine 10/14/2019  FLUOROSCOPY TIME:  0 minutes 5.5 seconds Images obtained: 1 Dose: 0.93 mGy FINDINGS: Overpenetrated single lateral view. Anterior plate and screws identified at C3-C4 with intervening disc prosthesis. Disc space narrowing and endplate spur formation C5-C6. Vertebral body heights grossly maintained. IMPRESSION: Anterior fusion C3-C4. 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 Electronically Signed   By: Lavonia Dana M.D.   On: 11/09/2019 10:49   DG C-Arm 1-60 Min  Result Date: 11/09/2019 CLINICAL DATA:  Anterior cervical discectomy and fusion C3-C4 EXAM: CERVICAL SPINE - 2-3 VIEW; DG C-ARM 1-60 MIN COMPARISON:  MR cervical spine 10/14/2019 FLUOROSCOPY TIME:  0 minutes 5.5 seconds Images obtained: 1 Dose: 0.93 mGy FINDINGS: Overpenetrated single lateral view. Anterior plate and screws identified at C3-C4 with intervening disc prosthesis. Disc space narrowing and endplate spur formation C5-C6. Vertebral body heights grossly maintained. IMPRESSION: Anterior fusion C3-C4. 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 Electronically Signed   By: Lavonia Dana M.D.   On: 11/09/2019 10:49    Disposition: Discharge disposition: 01-Home or Self Care        POD #1 s/p ACDF, doing well  - encourage ambulation - Percocet for pain, Robaxin for muscle spasms -Scripts for pain sent to pharmacy electronically  -D/C instructions sheet printed and in chart -D/C today  -F/U in office 2 weeks   Signed: Lennie Muckle Frederich [place]illa 11/22/2019, 1:21 PM

## 2020-01-05 ENCOUNTER — Other Ambulatory Visit (HOSPITAL_COMMUNITY): Payer: Self-pay | Admitting: Urology

## 2020-01-05 DIAGNOSIS — C61 Malignant neoplasm of prostate: Secondary | ICD-10-CM

## 2020-01-15 ENCOUNTER — Other Ambulatory Visit: Payer: Self-pay

## 2020-01-15 ENCOUNTER — Encounter (HOSPITAL_COMMUNITY)
Admission: RE | Admit: 2020-01-15 | Discharge: 2020-01-15 | Disposition: A | Discharge status: Home / Self Care | Payer: Medicare PPO | Source: Ambulatory Visit | Attending: Urology | Admitting: Urology

## 2020-01-15 DIAGNOSIS — C61 Malignant neoplasm of prostate: Secondary | ICD-10-CM | POA: Insufficient documentation

## 2020-01-15 MED ORDER — TECHNETIUM TC 99M MEDRONATE IV KIT
20.2000 | PACK | Freq: Once | INTRAVENOUS | Status: AC
  Administered 2020-01-15: 20.2 via INTRAVENOUS

## 2020-01-26 ENCOUNTER — Ambulatory Visit (INDEPENDENT_AMBULATORY_CARE_PROVIDER_SITE_OTHER): Payer: Medicare PPO | Admitting: Psychologist

## 2020-01-26 DIAGNOSIS — F411 Generalized anxiety disorder: Secondary | ICD-10-CM

## 2020-02-07 ENCOUNTER — Ambulatory Visit (INDEPENDENT_AMBULATORY_CARE_PROVIDER_SITE_OTHER): Payer: Medicare PPO | Admitting: Psychologist

## 2020-02-07 DIAGNOSIS — F411 Generalized anxiety disorder: Secondary | ICD-10-CM | POA: Diagnosis not present

## 2020-02-13 ENCOUNTER — Ambulatory Visit: Payer: Medicare PPO | Admitting: Allergy & Immunology

## 2020-02-13 ENCOUNTER — Other Ambulatory Visit: Payer: Self-pay

## 2020-02-13 ENCOUNTER — Encounter: Payer: Self-pay | Admitting: Allergy & Immunology

## 2020-02-13 VITALS — BP 116/70 | HR 83 | Temp 97.3°F | Resp 16 | Ht 74.0 in | Wt 169.8 lb

## 2020-02-13 DIAGNOSIS — K9049 Malabsorption due to intolerance, not elsewhere classified: Secondary | ICD-10-CM

## 2020-02-13 DIAGNOSIS — L299 Pruritus, unspecified: Secondary | ICD-10-CM | POA: Diagnosis not present

## 2020-02-13 DIAGNOSIS — J310 Chronic rhinitis: Secondary | ICD-10-CM

## 2020-02-13 MED ORDER — AZELASTINE HCL 0.1 % NA SOLN
2.0000 | Freq: Two times a day (BID) | NASAL | 5 refills | Status: DC
Start: 2020-02-13 — End: 2020-06-24

## 2020-02-13 NOTE — Patient Instructions (Addendum)
1. Chronic rhinitis - Typically we only continue with allergy shots for 3-5 years TOTAL, so 30 years is certainly not best practice. - I think we can hold off on repeat testing and allergy shots for now since you are doing well. - In the meantime, continue with azelastine 1-2 sprays per nostril up to twice daily and Clarinex as needed. - We can certainly retest you in the future if needed.   2. Itching - I do not think this is allergy related at this time. - Let us know if this continues and we can go ahead a do a workup for this.  3. Return in about 6 months (around 08/13/2020).     Please inform us of any Emergency Department visits, hospitalizations, or changes in symptoms. Call us before going to the ED for breathing or allergy symptoms since we might be able to fit you in for a sick visit. Feel free to contact us anytime with any questions, problems, or concerns.  It was a pleasure to meet you today! Welcome to [facility]!   Websites that have reliable patient information: 1. American Academy of Asthma, Allergy, and Immunology: www.aaaai.org 2. Food Allergy Research and Education (FARE): foodallergy.org 3. Mothers of Asthmatics: http://www.asthmacommunitynetwork.org 4. American College of Allergy, Asthma, and Immunology: www.acaai.org   COVID-19 Vaccine Information can be found at: ShippingScam.co.uk For questions related to vaccine distribution or appointments, please email vaccine@[email or web address] .com or call [phone].     "Like" [place] on Facebook and Instagram for our latest updates!     HAPPY FALL!     Make sure you are registered to vote! If you have moved or changed any of your contact information, you will need to get this updated before voting!  In some cases, you MAY be able to register to vote online: [email or web address]

## 2020-02-13 NOTE — Progress Notes (Signed)
NEW PATIENT  Date of Service/Encounter:  02/13/20  Referring provider: Lucianne Lei, MD   Assessment:   Chronic rhinitis  Itching   Adverse food reaction (corn, pork) - sensitization only (clinically tolerates)  Plan/Recommendations:   1. Chronic rhinitis - Typically we only continue with allergy shots for 3-5 years TOTAL, so 30 years is certainly not best practice. - I think we can hold off on repeat testing and allergy shots for now since you are doing well. - In the meantime, continue with azelastine 1-2 sprays per nostril up to twice daily and Clarinex as needed. - We can certainly retest you in the future if needed.   2. Itching - I do not think this is allergy related at this time. - Let us know if this continues and we can go ahead a do a workup for this.  3. Return in about 6 months (around 08/13/2020).    Subjective:   Kenneth Daniel is a 74 y.o. male presenting today for evaluation of  Chief Complaint  Patient presents with  . Allergic Rhinitis     Wants to see if he needs to get back on allergy shots. Was on the allergy shots for the past 30 years hasnt had any allergy shots since he's been in [place]. He moved from [facility]. He has not had any problems since he has moved to [facility]    Kenneth Daniel has a history of the following: Patient Active Problem List   Diagnosis Date Noted  . Myelopathy ([place]) 11/09/2019    History obtained from: chart review and patient.  Kenneth Daniel was referred by Lucianne Lei, MD.     Kenneth Daniel is a 74 y.o. male presenting for an evaluation of allergies. He has two sisters here in [place]. He retired in May 2021. 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 He has surgery in July 2021. He is on Rehab twice weekly. He did have some numbness in his fingers, but no pain any longer. He does report some stiffness in his legs and arms. He can walk one quarter of a mile per day, previously he was doing 1.5 miles. He  is originally from [place], [place].   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 He was in [facility] initially. Then he retired 11 years ago and was introduced to a smaller job with less responsibilities. He likes working at [employer] and is hoping to find some kind of volunteer opportunity here.   Allergic Rhinitis Symptom History: He has been on shots for 30 years. He started shots because he have moved from [place] and had problesm with the grass and trees in [place]. He was tested and has been on shots for 30 years. Shots helped quite a bit. He has not had one in 4 months or so. He has noticed that when he left [place] and went to a coller temperature, he would not have a problem. He is currently on azelastine every day and he uses the Clarinex as needed.    He was having sinus infections frequently prior to this. Now he has not had a sinus infection in quite some time. He does not remember the last time that he needed antibiotics.   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 He eats corn without a problem. He does occasionally eat pork now. He does not have an epinephrine  auto-inhjector.  He has noted some itching. This started around the time of his spine surgery. He is unsure whether this is related to something put into his body during the surgery or something entirely different.   Otherwise, there is no history of other atopic diseases, including asthma, drug allergies, stinging insect allergies, eczema, urticaria or contact dermatitis. There is no significant infectious history. Vaccinations are up to date.    Past Medical History: Patient Active Problem List   Diagnosis Date Noted  . Myelopathy ([name]) 11/09/2019    Medication List:  Allergies as of 02/13/2020   No Known Allergies     Medication List       Accurate as of February 13, 2020 11:50 AM. If you have any questions, ask your nurse or doctor.         acetaminophen 500 MG tablet Commonly known as: TYLENOL Take 1,000 mg by mouth every 8 (eight) hours as needed for moderate pain.   aluminum hydroxide-magnesium carbonate 95-358 MG/15ML Susp Commonly known as: GAVISCON Take 30 mLs by mouth as needed for indigestion or heartburn.   amLODipine 10 MG tablet Commonly known as: NORVASC Take 10 mg by mouth at bedtime.   ascorbic acid 500 MG tablet Commonly known as: VITAMIN C Take 500 mg by mouth daily.   aspirin 81 MG EC tablet Take by mouth.   atorvastatin 40 MG tablet Commonly known as: LIPITOR Take 40 mg by mouth at bedtime.   Azelastine HCl 0.15 % Soln Place 1 spray into both nostrils 2 (two) times daily as needed for rhinitis. Use in each nostril as directed What changed: Another medication with the same name was added. Make sure you understand how and when to take each. Changed by: Valentina Shaggy, MD   azelastine 0.1 % nasal spray Commonly known as: ASTELIN Place 2 sprays into both nostrils 2 (two) times daily. What changed: You were already taking a medication with the same name, and this prescription was added. Make sure you understand how and when to take each. Changed by: Valentina Shaggy, MD   b complex vitamins tablet Take 1 tablet by mouth daily.   busPIRone 5 MG tablet Commonly known as: BUSPAR 1 TABLET TWICE A DAY AS NEEDED ANXIETY ORALLY   Bystolic 20 MG Tabs Generic drug: Nebivolol HCl Take 20 mg by mouth daily.   cholecalciferol 25 MCG (1000 UNIT) tablet Commonly known as: VITAMIN D3 Take 1,000 Units by mouth daily.   famotidine 40 MG tablet Commonly known as: PEPCID Take 40 mg by mouth at bedtime.   JUICE PLUS FIBRE PO Take 2 capsules by mouth daily.   Magnesium 250 MG Tabs Take 250 mg by mouth daily.   methocarbamol 500 MG tablet Commonly known as: ROBAXIN Take 1 tablet (500 mg total) by mouth every 6 (six) hours as needed for muscle spasms.   oxyCODONE-acetaminophen 5-325  MG tablet Commonly known as: PERCOCET/ROXICET Take 1-2 tablets by mouth every 4 (four) hours as needed for moderate pain or severe pain.   PROBIOTIC DAILY PO Take 1 capsule by mouth daily.   quinapril 40 MG tablet Commonly known as: ACCUPRIL Take 40 mg by mouth at bedtime.   zinc gluconate 50 MG tablet Take 50 mg by mouth daily.       Birth History: non-contributory  Developmental History: non-contributory  Past Surgical History: Past Surgical History:  Procedure Laterality Date  . ANTERIOR CERVICAL DECOMP/DISCECTOMY FUSION N/A 11/09/2019   Procedure: ANTERIOR CERVICAL DECOMPRESSION FUSION CERVICAL  3-4 WITH INSTRUMENTATION AND ALLOGRAFT;  Surgeon: Phylliss Bob, MD;  Location: [facility];  Service: Orthopedics;  Laterality: N/A;  . COLONOSCOPY  2020  . cyst removed     back- benign  . PROSTATECTOMY  2004  . UPPER GI ENDOSCOPY  2021     Family History: History reviewed. No pertinent family history.   Social History: Leib lives at home by himself. He lives in an apartment with carpeting throughout the home. There is electric heating and central cooling. There are no animals inside or outside of the home. There are dust mite coverings on the bedding. There is no tobacco exposure. They are not exposed to chemicals or dust in the home.     Review of Systems  Constitutional: Negative.  Negative for chills, fever, malaise/fatigue and weight loss.  HENT: Positive for congestion. Negative for ear discharge and ear pain.   Eyes: Negative for pain, discharge and redness.  Respiratory: Negative for cough, sputum production, shortness of breath and wheezing.   Cardiovascular: Negative.  Negative for chest pain and palpitations.  Gastrointestinal: Negative for abdominal pain, constipation, diarrhea, heartburn, nausea and vomiting.  Skin: Negative.  Negative for itching and rash.  Neurological: Negative for dizziness and headaches.  Endo/Heme/Allergies: Negative for environmental  allergies. Does not bruise/bleed easily.       Objective:   Blood pressure 116/70, pulse 83, temperature (!) 97.3 F (36.3 C), resp. rate 16, height 6\' 2"  (1.88 m), weight 169 lb 12.8 oz (77 kg), SpO2 97 %. Body mass index is 21.8 kg/m.   Physical Exam:   Physical Exam Constitutional:      Appearance: He is well-developed and well-groomed.  HENT:     Head: Normocephalic and atraumatic.     Right Ear: Tympanic membrane, ear canal and external ear normal. No drainage, swelling or tenderness. Tympanic membrane is not injected, scarred, erythematous, retracted or bulging.     Left Ear: Tympanic membrane, ear canal and external ear normal. No drainage, swelling or tenderness. Tympanic membrane is not injected, scarred, erythematous, retracted or bulging.     Nose: No nasal deformity, septal deviation, mucosal edema or rhinorrhea.     Right Turbinates: Enlarged and swollen.     Left Turbinates: Enlarged and swollen.     Right Sinus: No maxillary sinus tenderness or frontal sinus tenderness.     Left Sinus: No maxillary sinus tenderness or frontal sinus tenderness.     Mouth/Throat:     Mouth: Mucous membranes are not pale and not dry.     Pharynx: Uvula midline.  Eyes:     General:        Right eye: No discharge.        Left eye: No discharge.     Conjunctiva/sclera: Conjunctivae normal.     Right eye: Right conjunctiva is not injected. No chemosis.    Left eye: Left conjunctiva is not injected. No chemosis.    Pupils: Pupils are equal, round, and reactive to light.  Cardiovascular:     Rate and Rhythm: Normal rate and regular rhythm.     Heart sounds: Normal heart sounds.  Pulmonary:     Effort: Pulmonary effort is normal. No tachypnea, accessory muscle usage or respiratory distress.     Breath sounds: Normal breath sounds. No wheezing, rhonchi or rales.     Comments: Moving air well in all lung fields. No wheezes.  Chest:     Chest wall: No tenderness.  Abdominal:      Tenderness:  There is no abdominal tenderness. There is no guarding or rebound.  Lymphadenopathy:     Head:     Right side of head: No submandibular, tonsillar or occipital adenopathy.     Left side of head: No submandibular, tonsillar or occipital adenopathy.     Cervical: No cervical adenopathy.  Skin:    Coloration: Skin is not pale.     Findings: No abrasion, erythema, petechiae or rash. Rash is not papular, urticarial or vesicular.  Neurological:     Mental Status: He is alert.  Psychiatric:        Behavior: Behavior is cooperative.      Diagnostic studies: none       Kenneth Marvel, MD [facility] and [place] [facility] [place]

## 2020-02-15 ENCOUNTER — Ambulatory Visit (INDEPENDENT_AMBULATORY_CARE_PROVIDER_SITE_OTHER): Payer: Medicare PPO | Admitting: Psychologist

## 2020-02-15 DIAGNOSIS — F411 Generalized anxiety disorder: Secondary | ICD-10-CM | POA: Diagnosis not present

## 2020-02-19 ENCOUNTER — Ambulatory Visit (INDEPENDENT_AMBULATORY_CARE_PROVIDER_SITE_OTHER): Payer: Medicare PPO | Admitting: Psychologist

## 2020-02-19 DIAGNOSIS — F411 Generalized anxiety disorder: Secondary | ICD-10-CM | POA: Diagnosis not present

## 2020-02-27 ENCOUNTER — Ambulatory Visit (INDEPENDENT_AMBULATORY_CARE_PROVIDER_SITE_OTHER): Payer: Medicare PPO | Admitting: Psychologist

## 2020-02-27 DIAGNOSIS — F411 Generalized anxiety disorder: Secondary | ICD-10-CM | POA: Diagnosis not present

## 2020-03-07 ENCOUNTER — Ambulatory Visit (INDEPENDENT_AMBULATORY_CARE_PROVIDER_SITE_OTHER): Payer: Medicare PPO | Admitting: Psychologist

## 2020-03-07 DIAGNOSIS — F411 Generalized anxiety disorder: Secondary | ICD-10-CM | POA: Diagnosis not present

## 2020-03-22 ENCOUNTER — Ambulatory Visit (INDEPENDENT_AMBULATORY_CARE_PROVIDER_SITE_OTHER): Payer: Medicare PPO | Admitting: Psychologist

## 2020-03-22 DIAGNOSIS — F411 Generalized anxiety disorder: Secondary | ICD-10-CM | POA: Diagnosis not present

## 2020-04-09 ENCOUNTER — Ambulatory Visit: Payer: Medicare PPO | Admitting: Psychologist

## 2020-04-24 ENCOUNTER — Ambulatory Visit (INDEPENDENT_AMBULATORY_CARE_PROVIDER_SITE_OTHER): Payer: Medicare PPO | Admitting: Psychologist

## 2020-04-24 DIAGNOSIS — F411 Generalized anxiety disorder: Secondary | ICD-10-CM | POA: Diagnosis not present

## 2020-05-20 ENCOUNTER — Ambulatory Visit: Payer: Medicare PPO | Admitting: Cardiology

## 2020-05-22 ENCOUNTER — Encounter: Payer: Self-pay | Admitting: Cardiology

## 2020-05-22 ENCOUNTER — Other Ambulatory Visit: Payer: Self-pay

## 2020-05-22 ENCOUNTER — Ambulatory Visit: Payer: Medicare PPO | Admitting: Cardiology

## 2020-05-22 VITALS — BP 132/73 | HR 93 | Temp 98.2°F | Ht 74.0 in | Wt 178.0 lb

## 2020-05-22 DIAGNOSIS — E7800 Pure hypercholesterolemia, unspecified: Secondary | ICD-10-CM

## 2020-05-22 DIAGNOSIS — C61 Malignant neoplasm of prostate: Secondary | ICD-10-CM

## 2020-05-22 DIAGNOSIS — R9431 Abnormal electrocardiogram [ECG] [EKG]: Secondary | ICD-10-CM

## 2020-05-22 DIAGNOSIS — I10 Essential (primary) hypertension: Secondary | ICD-10-CM

## 2020-05-22 DIAGNOSIS — R0989 Other specified symptoms and signs involving the circulatory and respiratory systems: Secondary | ICD-10-CM

## 2020-05-22 DIAGNOSIS — R5383 Other fatigue: Secondary | ICD-10-CM

## 2020-05-22 MED ORDER — NEBIVOLOL HCL 10 MG PO TABS: 10.0000 mg | ORAL_TABLET | Freq: Every morning | ORAL | 0 refills | Status: DC

## 2020-05-22 NOTE — Progress Notes (Addendum)
Date:  06/03/2020   ID:  Kenneth Daniel, DOB 05/23/1945, MRN [id number]  PCP:  Lucianne Lei, MD  Cardiologist:  Rex Kras, DO, [facility] (established care 05/22/2020) Former Cardiology Providers: Dr. Orie Rout [facility])  REASON FOR CONSULT: Reduced physical endurance and stamina  REQUESTING PHYSICIAN:  Lucianne Lei, [address],  [place] [address]  Chief Complaint  Patient presents with  . Low stamina  . Fatigue    HPI  Kenneth Daniel is a 75 y.o. male who presents to the office with a chief complaint of " decreased physical endurance." Patient's past medical history and cardiovascular risk factors include: Prostate Cancer (s/p surgery, radiation,  and hormone tx), Hypertension, Hyperlipidemia, advanced age, former smoker.  He is referred to the office at the request of Lucianne Lei, MD for evaluation of decreased physical endurance.  Patient is originally from [place] and recently moved to [place] and is referred to the office by his PCP.  While in [place] he was following Dr. Rodena Medin at least twice yearly for what appears to be cardiovascular risk stratification.  Patient states that he has undergone echocardiogram, stress test, and a left heart catheterization approximately 18 years ago with Dr. Rodena Medin.  Recently had C-spine surgery and is in rehab and per patient progressing well. However, recently he has been experiencing decreased endurance with physical exertion. Prior to his C-spine surgery back in July 2021 he was walking 1-2 mile 2-3 times a week. He takes frequent breaks. He denies chest pain at rest or w/ effort related activities. No shortness of breath at rest or w/ activities.   FUNCTIONAL STATUS: Currently in physical therapy.    ALLERGIES: No Known Allergies  MEDICATION LIST PRIOR TO VISIT: Current Meds  Medication Sig  . acetaminophen (TYLENOL) 500 MG tablet Take 1,000 mg by mouth every 8 (eight) hours as needed for moderate pain.  Marland Kitchen aluminum  hydroxide-magnesium carbonate (GAVISCON) 95-358 MG/15ML SUSP Take 30 mLs by mouth as needed for indigestion or heartburn.  Marland Kitchen amLODipine (NORVASC) 10 MG tablet Take 10 mg by mouth at bedtime.  Marland Kitchen aspirin 81 MG EC tablet Take by mouth.  Marland Kitchen azelastine (ASTELIN) 0.1 % nasal spray Place 2 sprays into both nostrils 2 (two) times daily.  . Azelastine HCl 0.15 % SOLN Place 1 spray into both nostrils 2 (two) times daily as needed for rhinitis. Use in each nostril as directed  . b complex vitamins tablet Take 1 tablet by mouth daily.  . cholecalciferol (VITAMIN D3) 25 MCG (1000 UNIT) tablet Take 1,000 Units by mouth daily.  Marland Kitchen ECHINACEA EXTRACT PO Take by mouth.  . famotidine (PEPCID) 40 MG tablet Take 40 mg by mouth at bedtime.  . Magnesium 250 MG TABS Take 250 mg by mouth daily.  . Melatonin 5 MG CAPS Take by mouth.  . nebivolol (BYSTOLIC) 10 MG tablet Take 1 tablet (10 mg total) by mouth in the morning.  . Nutritional Supplements (JUICE PLUS FIBRE PO) Take 2 capsules by mouth daily.  . Probiotic Product (PROBIOTIC DAILY PO) Take 1 capsule by mouth daily.  . valsartan-hydrochlorothiazide (DIOVAN-HCT) 320-12.5 MG tablet Take 1 tablet by mouth daily.  Marland Kitchen zinc gluconate 50 MG tablet Take 50 mg by mouth daily.  . [DISCONTINUED] Nebivolol HCl (BYSTOLIC) 20 MG TABS Take 20 mg by mouth daily.     PAST MEDICAL HISTORY: Past Medical History:  Diagnosis Date  . Acid reflux   . Borderline anemia   . Hearing loss  wears hearing aids  . HLD (hyperlipidemia)   . Hypertension   . Prostate cancer ([place])   . Wears glasses     PAST SURGICAL HISTORY: Past Surgical History:  Procedure Laterality Date  . ANTERIOR CERVICAL DECOMP/DISCECTOMY FUSION N/A 11/09/2019   Procedure: ANTERIOR CERVICAL DECOMPRESSION FUSION CERVICAL 3-4 WITH INSTRUMENTATION AND ALLOGRAFT;  Surgeon: Phylliss Bob, MD;  Location: [facility];  Service: Orthopedics;  Laterality: N/A;  . CARDIAC CATHETERIZATION    . COLONOSCOPY  2020  . cyst  removed     back- benign  . PROSTATECTOMY  2004  . UPPER GI ENDOSCOPY  2021    FAMILY HISTORY: The patient family history includes Pancreatic cancer in his mother; Prostate cancer in his father.  SOCIAL HISTORY:  The patient  reports that he quit smoking about 48 years ago. His smoking use included cigarettes. He has never used smokeless tobacco. He reports that he does not drink alcohol and does not use drugs.  REVIEW OF SYSTEMS: Review of Systems  Constitutional: Positive for malaise/fatigue. Negative for chills and fever.  HENT: Negative for hoarse voice and nosebleeds.        Wears hearing aid.   Eyes: Negative for discharge, double vision and pain.  Cardiovascular: Negative for chest pain, claudication, dyspnea on exertion, leg swelling, near-syncope, orthopnea, palpitations, paroxysmal nocturnal dyspnea and syncope.  Respiratory: Negative for hemoptysis and shortness of breath.   Musculoskeletal: Negative for muscle cramps and myalgias.  Gastrointestinal: Negative for abdominal pain, constipation, diarrhea, hematemesis, hematochezia, melena, nausea and vomiting.  Neurological: Negative for dizziness and light-headedness.    PHYSICAL EXAM: Vitals with BMI 05/22/2020 02/13/2020 11/10/2019  Height $Remov'6\' 2"'WrjCzW$  $Remove'6\' 2"'nZFsQaq$  -  Weight 178 lbs 169 lbs 13 oz -  BMI 31.51 76.16 -  Systolic 073 710 626  Diastolic 73 70 87  Pulse 93 83 93   CONSTITUTIONAL: Well-developed and well-nourished. No acute distress.  SKIN: Skin is warm and dry. No rash noted. No cyanosis. No pallor. No jaundice HEAD: Normocephalic and atraumatic.  EYES: No scleral icterus MOUTH/THROAT: Moist oral membranes.  NECK: No JVD present. No thyromegaly noted. Right carotid bruits  LYMPHATIC: No visible cervical adenopathy.  CHEST Normal respiratory effort. No intercostal retractions  LUNGS: Clear to auscultation bilaterally. No stridor. No wheezes. No rales.  CARDIOVASCULAR: Regular rate and rhythm, positive S1-S2, no murmurs  rubs or gallops appreciated. ABDOMINAL: No apparent ascites.  EXTREMITIES: No peripheral edema  HEMATOLOGIC: No significant bruising NEUROLOGIC: Oriented to person, place, and time. Nonfocal. Normal muscle tone.  PSYCHIATRIC: Normal mood and affect. Normal behavior. Cooperative  CARDIAC DATABASE: EKG: 05/22/2020: Normal sinus rhythm, 75 bpm, normal axis, LVH per voltage criteria, without underlying injury pattern.    Echocardiogram: No results found for this or any previous visit from the past 1095 days.   Stress Testing: No results found for this or any previous visit from the past 1095 days.  Heart Catheterization: 18 years ago w/ Dr. Orie Rout back in [facility]. No records to review.   LABORATORY DATA: CBC Latest Ref Rng & Units 11/07/2019  WBC 4.0 - 10.5 K/uL 3.7(L)  Hemoglobin 13.0 - 17.0 g/dL 12.9(L)  Hematocrit 39.0 - 52.0 % 40.8  Platelets 150 - 400 K/uL 178    CMP Latest Ref Rng & Units 11/07/2019  Glucose 70 - 99 mg/dL 100(H)  BUN 8 - 23 mg/dL 18  Creatinine 0.61 - 1.24 mg/dL 0.99  Sodium 135 - 145 mmol/L 140  Potassium 3.5 - 5.1 mmol/L 4.0  Chloride  98 - 111 mmol/L 104  CO2 22 - 32 mmol/L 26  Calcium 8.9 - 10.3 mg/dL 9.3  Total Protein 6.5 - 8.1 g/dL 7.0  Total Bilirubin 0.3 - 1.2 mg/dL 0.7  Alkaline Phos 38 - 126 U/L 56  AST 15 - 41 U/L 25  ALT 0 - 44 U/L 33    Lipid Panel  No results found for: CHOL, TRIG, HDL, CHOLHDL, VLDL, LDLCALC, LDLDIRECT, LABVLDL  No components found for: NTPROBNP No results for input(s): PROBNP in the last 8760 hours. No results for input(s): TSH in the last 8760 hours.  BMP Recent Labs    11/07/19 1154  NA 140  K 4.0  CL 104  CO2 26  GLUCOSE 100*  BUN 18  CREATININE 0.99  CALCIUM 9.3  GFRNONAA >60  GFRAA >60    HEMOGLOBIN A1C No results found for: HGBA1C, MPG  External Labs: Collected: 10/25/2019 Creatinine 0.99 mg/dL. eGFR: >60 mL/min per 1.73 m Potassium 4.2 AST 24, ALT 38, alkaline phosphatase 59 Lipid profile:  Total cholesterol 105, triglycerides 54, HDL 49, LDL 43, non-HDL 56 Hemoglobin A1c: 5.6 TSH: 0.73,Free T4 1.3, and Free T3 2.8 BNP: 53   IMPRESSION:    ICD-10-CM   1. Fatigue, unspecified type  R53.83 EKG 12-Lead    nebivolol (BYSTOLIC) 10 MG tablet  2. Benign hypertension  I10 PCV ECHOCARDIOGRAM COMPLETE    PCV MYOCARDIAL PERFUSION WITH LEXISCAN    nebivolol (BYSTOLIC) 10 MG tablet    EKG 89-UQXA  3. Hypercholesteremia  E78.00 PCV MYOCARDIAL PERFUSION WITH LEXISCAN  4. Prostate cancer (HCC)  C61   5. Right carotid bruit  R09.89 PCV CAROTID DUPLEX (BILATERAL)  6. Nonspecific abnormal electrocardiogram (ECG) (EKG)  R94.31 EKG 12-Lead    PCV MYOCARDIAL PERFUSION WITH LEXISCAN     RECOMMENDATIONS: Kenneth Daniel is a 75 y.o. male whose past medical history and cardiac risk factors include: Prostate Cancer (s/p surgery, radiation,  and hormone tx), Hypertension, Hyperlipidemia, advanced age, former smoker.  Fatigue/decreased physical endurance:  We will check an echocardiogram to evaluate LVEF, valvular heart disease.  Given the patient's cardiovascular risk factors, estimated 10-year risk of ASCVD to be approximately 18.6%, not a treadmill candidate, LVH on EKG we will order Lexiscan for CAD screening.  Will decrease Bystolic to 10 mg p.o. every morning  Patient is also asked to discuss noncardiac causes of fatigue and decreased physical endurance with his PCP.  Differential diagnosis includes worsening anemia, making sure that his prostate cancer remains in remission, etc.  Benign essential hypertension: Office blood pressure is well controlled.  Medications reconciled.  Educated on importance of a low-salt diet.  Currently managed by primary care provider.  Right carotid bruit: Currently on aspirin and statin therapy.  We will check a carotid duplex.  FINAL MEDICATION LIST END OF ENCOUNTER: Meds ordered this encounter  Medications  . nebivolol (BYSTOLIC) 10 MG tablet    Sig:  Take 1 tablet (10 mg total) by mouth in the morning.    Dispense:  90 tablet    Refill:  0     Current Outpatient Medications:  .  acetaminophen (TYLENOL) 500 MG tablet, Take 1,000 mg by mouth every 8 (eight) hours as needed for moderate pain., Disp: , Rfl:  .  aluminum hydroxide-magnesium carbonate (GAVISCON) 95-358 MG/15ML SUSP, Take 30 mLs by mouth as needed for indigestion or heartburn., Disp: , Rfl:  .  amLODipine (NORVASC) 10 MG tablet, Take 10 mg by mouth at bedtime., Disp: , Rfl:  .  aspirin 81 MG EC tablet, Take by mouth., Disp: , Rfl:  .  azelastine (ASTELIN) 0.1 % nasal spray, Place 2 sprays into both nostrils 2 (two) times daily., Disp: 30 mL, Rfl: 5 .  Azelastine HCl 0.15 % SOLN, Place 1 spray into both nostrils 2 (two) times daily as needed for rhinitis. Use in each nostril as directed, Disp: , Rfl:  .  b complex vitamins tablet, Take 1 tablet by mouth daily., Disp: , Rfl:  .  cholecalciferol (VITAMIN D3) 25 MCG (1000 UNIT) tablet, Take 1,000 Units by mouth daily., Disp: , Rfl:  .  ECHINACEA EXTRACT PO, Take by mouth., Disp: , Rfl:  .  famotidine (PEPCID) 40 MG tablet, Take 40 mg by mouth at bedtime., Disp: , Rfl:  .  Magnesium 250 MG TABS, Take 250 mg by mouth daily., Disp: , Rfl:  .  Melatonin 5 MG CAPS, Take by mouth., Disp: , Rfl:  .  nebivolol (BYSTOLIC) 10 MG tablet, Take 1 tablet (10 mg total) by mouth in the morning., Disp: 90 tablet, Rfl: 0 .  Nutritional Supplements (JUICE PLUS FIBRE PO), Take 2 capsules by mouth daily., Disp: , Rfl:  .  Probiotic Product (PROBIOTIC DAILY PO), Take 1 capsule by mouth daily., Disp: , Rfl:  .  valsartan-hydrochlorothiazide (DIOVAN-HCT) 320-12.5 MG tablet, Take 1 tablet by mouth daily., Disp: , Rfl:  .  zinc gluconate 50 MG tablet, Take 50 mg by mouth daily., Disp: , Rfl:  .  ascorbic acid (VITAMIN C) 500 MG tablet, Take 500 mg by mouth daily., Disp: , Rfl:  .  atorvastatin (LIPITOR) 40 MG tablet, Take 40 mg by mouth at bedtime., Disp: ,  Rfl:   Orders Placed This Encounter  Procedures  . PCV MYOCARDIAL PERFUSION WITH LEXISCAN  . EKG 12-Lead  . EKG 12-Lead  . PCV ECHOCARDIOGRAM COMPLETE  . PCV CAROTID DUPLEX (BILATERAL)   There are no Patient Instructions on file for this visit.   --Continue cardiac medications as reconciled in final medication list. --Return in about 4 weeks (around 06/19/2020) for Reevaluation of decreased endurance and review test results. . Or sooner if needed. --Continue follow-up with your primary care physician regarding the management of your other chronic comorbid conditions.  Patient's questions and concerns were addressed to his satisfaction. He voices understanding of the instructions provided during this encounter.   This note was created using a voice recognition software as a result there may be grammatical errors inadvertently enclosed that do not reflect the nature of this encounter. Every attempt is made to correct such errors.  Rex Kras, [place], [facility]  Pager: [phone] Office: [phone]

## 2020-05-27 ENCOUNTER — Ambulatory Visit (INDEPENDENT_AMBULATORY_CARE_PROVIDER_SITE_OTHER): Payer: Medicare PPO | Admitting: Psychologist

## 2020-05-27 DIAGNOSIS — F411 Generalized anxiety disorder: Secondary | ICD-10-CM | POA: Diagnosis not present

## 2020-05-29 ENCOUNTER — Ambulatory Visit: Payer: Medicare PPO

## 2020-05-29 ENCOUNTER — Other Ambulatory Visit: Payer: Self-pay

## 2020-05-29 DIAGNOSIS — I10 Essential (primary) hypertension: Secondary | ICD-10-CM

## 2020-05-29 DIAGNOSIS — E7800 Pure hypercholesterolemia, unspecified: Secondary | ICD-10-CM

## 2020-06-03 ENCOUNTER — Telehealth: Payer: Self-pay

## 2020-06-03 NOTE — Telephone Encounter (Signed)
Patient called requesting recent stress test results. Please advise.

## 2020-06-03 NOTE — Telephone Encounter (Signed)
Normal stress test

## 2020-06-04 ENCOUNTER — Ambulatory Visit: Payer: Medicare PPO

## 2020-06-04 ENCOUNTER — Other Ambulatory Visit: Payer: Self-pay

## 2020-06-04 DIAGNOSIS — I6521 Occlusion and stenosis of right carotid artery: Secondary | ICD-10-CM

## 2020-06-04 DIAGNOSIS — I10 Essential (primary) hypertension: Secondary | ICD-10-CM

## 2020-06-04 DIAGNOSIS — R0989 Other specified symptoms and signs involving the circulatory and respiratory systems: Secondary | ICD-10-CM

## 2020-06-04 NOTE — Telephone Encounter (Signed)
Called and spoke with patient regarding his stress results.

## 2020-06-09 ENCOUNTER — Other Ambulatory Visit: Payer: Self-pay | Admitting: Cardiology

## 2020-06-09 DIAGNOSIS — I6521 Occlusion and stenosis of right carotid artery: Secondary | ICD-10-CM

## 2020-06-17 NOTE — Progress Notes (Signed)
Called and spoke with pt regarding stress test results. Pt voiced understanding.

## 2020-06-17 NOTE — Progress Notes (Signed)
No answer left a vm will try again later

## 2020-06-17 NOTE — Progress Notes (Signed)
Called and spoke with pt regarding echo results. Pt voiced understanding. Will fax over the results to his pcp.

## 2020-06-17 NOTE — Progress Notes (Signed)
Called and spoke with pt regarding CAD results. Pt voiced understanding.

## 2020-06-19 ENCOUNTER — Other Ambulatory Visit: Payer: Self-pay

## 2020-06-19 ENCOUNTER — Encounter: Payer: Self-pay | Admitting: Cardiology

## 2020-06-19 ENCOUNTER — Ambulatory Visit: Payer: Medicare PPO | Admitting: Cardiology

## 2020-06-19 VITALS — BP 137/78 | HR 84 | Temp 98.0°F | Resp 16 | Ht 74.0 in | Wt 182.0 lb

## 2020-06-19 DIAGNOSIS — C61 Malignant neoplasm of prostate: Secondary | ICD-10-CM

## 2020-06-19 DIAGNOSIS — E7800 Pure hypercholesterolemia, unspecified: Secondary | ICD-10-CM

## 2020-06-19 DIAGNOSIS — R5383 Other fatigue: Secondary | ICD-10-CM

## 2020-06-19 DIAGNOSIS — I10 Essential (primary) hypertension: Secondary | ICD-10-CM

## 2020-06-19 DIAGNOSIS — I6521 Occlusion and stenosis of right carotid artery: Secondary | ICD-10-CM

## 2020-06-19 DIAGNOSIS — I351 Nonrheumatic aortic (valve) insufficiency: Secondary | ICD-10-CM

## 2020-06-19 DIAGNOSIS — Z87891 Personal history of nicotine dependence: Secondary | ICD-10-CM

## 2020-06-19 NOTE — Progress Notes (Signed)
Date:  06/19/2020   ID:  Kenneth Daniel, DOB Jul 01, 1945, MRN [id number]  PCP:  Lucianne Lei, MD  Cardiologist:  Rex Kras, DO, [facility] (established care 05/22/2020) Former Cardiology Providers: Dr. Orie Rout [facility])  Date: 06/19/20 Last Office Visit: 05/22/2020  Chief Complaint  Patient presents with  . Fatigue, unspecified type  . Follow-up  . Results    HPI  Kenneth Daniel is a 75 y.o. male who presents to the office with a chief complaint of " 1 month follow-up to reevaluate decreased physical endurance/fatigue and review test results." Patient's past medical history and cardiovascular risk factors include: Asymptomatic right-sided carotid artery stenosis, prostate Cancer (s/p surgery, radiation,  and hormone tx), Hypertension, Hyperlipidemia, advanced age, former smoker.  He is referred to the office at the request of Lucianne Lei, MD for evaluation of decreased physical endurance.  Patient is originally from [place] and recently moved to [place] and is referred to the office by his PCP.  While in [place] he was following Dr. Rodena Medin at least twice yearly for what appears to be cardiovascular risk stratification.  He recently had C-spine surgery and is in rehab and per patient progressing well. However, recently he has been experiencing decreased endurance with physical exertion. Prior to his C-spine surgery back in July 2021 he was walking 1-2 mile 2-3 times a week but now he takes frequent breaks. He denies chest pain at rest or w/ effort related activities. No shortness of breath at rest or w/ activities.   At the last office visit he was recommended to undergo an echocardiogram and stress test. He underwent Lexiscan which noted normal myocardial perfusion overall a low risk study. Echocardiogram notes preserved LVEF with mild/moderate valvular heart disease. Patient was also noted to have incidental finding on the subcostal images suggestive of a liver cyst. Patient states that  this is an old finding and he was made aware of this while he was in [place]. Patient states that he also has a renal cyst. I have asked him to discuss it further with his PCP and follow-up with appropriate providers if needed. Will defer further management to PCP, patient is agreeable with the plan of care and verbalized understanding  At the last office visit he was noted to have a right carotid bruit on auscultation and therefore recommended to undergo carotid duplex. He has less than 50% stenosis in the right internal carotid artery and follow-up recommended in 1 year. Patient is currently on aspirin and statin therapy.  FUNCTIONAL STATUS: Currently in physical therapy.    ALLERGIES: No Known Allergies  MEDICATION LIST PRIOR TO VISIT: Current Meds  Medication Sig  . acetaminophen (TYLENOL) 500 MG tablet Take 1,000 mg by mouth every 8 (eight) hours as needed for moderate pain.  Mar[place] Kitchen aluminum hydroxide-magnesium carbonate (GAVISCON) 95-358 MG/15ML SUSP Take 30 mLs by mouth as needed for indigestion or heartburn.  Marland Kitchen amLODipine (NORVASC) 10 MG tablet Take 10 mg by mouth at bedtime.  Marland Kitchen ascorbic acid (VITAMIN C) 500 MG tablet Take 500 mg by mouth daily.  Marland Kitchen aspirin 81 MG EC tablet Take by mouth.  Mar[place] Kitchen atorvastatin (LIPITOR) 40 MG tablet Take 40 mg by mouth at bedtime.  Mar[place] Kitchen azelastine (ASTELIN) 0.1 % nasal spray Place 2 sprays into both nostrils 2 (two) times daily.  . Azelastine HCl 0.15 % SOLN Place 1 spray into both nostrils 2 (two) times daily as needed for rhinitis. Use in each nostril as directed  . b complex vitamins tablet Take  1 tablet by mouth daily.  . cholecalciferol (VITAMIN D3) 25 MCG (1000 UNIT) tablet Take 1,000 Units by mouth daily.  Marland Kitchen ECHINACEA EXTRACT PO Take by mouth.  . famotidine (PEPCID) 40 MG tablet Take 40 mg by mouth at bedtime.  . Magnesium 250 MG TABS Take 250 mg by mouth daily.  . Melatonin 5 MG CAPS Take by mouth.  . nebivolol (BYSTOLIC) 10 MG tablet Take 1 tablet (10 mg  total) by mouth in the morning.  . Nutritional Supplements (JUICE PLUS FIBRE PO) Take 2 capsules by mouth daily.  . Probiotic Product (PROBIOTIC DAILY PO) Take 1 capsule by mouth daily.  . valsartan-hydrochlorothiazide (DIOVAN-HCT) 320-12.5 MG tablet Take 1 tablet by mouth daily.  Marland Kitchen zinc gluconate 50 MG tablet Take 50 mg by mouth daily.     PAST MEDICAL HISTORY: Past Medical History:  Diagnosis Date  . Acid reflux   . Aortic regurgitation   . Borderline anemia   . 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 Hearing loss    wears hearing aids  . HLD (hyperlipidemia)   . Hypertension   . Prostate cancer ([place])   . Wears glasses     PAST SURGICAL HISTORY: Past Surgical History:  Procedure Laterality Date  . ANTERIOR CERVICAL DECOMP/DISCECTOMY FUSION N/A 11/09/2019   Procedure: ANTERIOR CERVICAL DECOMPRESSION FUSION CERVICAL 3-4 WITH INSTRUMENTATION AND ALLOGRAFT;  Surgeon: Phylliss Bob, MD;  Location: [facility];  Service: Orthopedics;  Laterality: N/A;  . CARDIAC CATHETERIZATION    . COLONOSCOPY  2020  . cyst removed     back- benign  . PROSTATECTOMY  2004  . UPPER GI ENDOSCOPY  2021    FAMILY HISTORY: The patient family history includes Pancreatic cancer in his mother; Prostate cancer in his father.  SOCIAL HISTORY:  The patient  reports that he quit smoking about 48 years ago. His smoking use included cigarettes. He has never used smokeless tobacco. He reports that he does not drink alcohol and does not use drugs.  REVIEW OF SYSTEMS: Review of Systems  Constitutional: Positive for malaise/fatigue. Negative for chills and fever.  HENT: Negative for hoarse voice and nosebleeds.        Wears hearing aid.   Eyes: Negative for discharge, double vision and pain.  Cardiovascular: Negative for chest pain, claudication, dyspnea on exertion, leg swelling, near-syncope, orthopnea, palpitations, paroxysmal nocturnal dyspnea and syncope.  Respiratory: Negative for  hemoptysis and shortness of breath.   Musculoskeletal: Negative for muscle cramps and myalgias.  Gastrointestinal: Negative for abdominal pain, constipation, diarrhea, hematemesis, hematochezia, melena, nausea and vomiting.  Neurological: Negative for dizziness and light-headedness.    PHYSICAL EXAM: Vitals with BMI 06/19/2020 05/22/2020 02/13/2020  Height $Remov'6\' 2"'OYfWNY$  $Remove'6\' 2"'SsNPGBL$  $RemoveB'6\' 2"'oYpmaxwS$   Weight 182 lbs 178 lbs 169 lbs 13 oz  BMI 23.36 22.63 33.54  Systolic 562 563 893  Diastolic 78 73 70  Pulse 84 93 83   CONSTITUTIONAL: Well-developed and well-nourished. No acute distress.  SKIN: Skin is warm and dry. No rash noted. No cyanosis. No pallor. No jaundice HEAD: Normocephalic and atraumatic.  EYES: No scleral icterus MOUTH/THROAT: Moist oral membranes.  NECK: No JVD present. No thyromegaly noted. Right carotid bruits  LYMPHATIC: No visible cervical adenopathy.  CHEST Normal respiratory effort. No intercostal retractions  LUNGS: Clear to auscultation bilaterally. No stridor. No wheezes. No rales.  CARDIOVASCULAR: Regular rate and rhythm, positive S1-S2, 3 out of 6 diastolic murmur heard at the left sternal border. No rubs or gallops appreciated.  ABDOMINAL: No apparent ascites.  EXTREMITIES: No peripheral edema  HEMATOLOGIC: No significant bruising NEUROLOGIC: Oriented to person, place, and time. Nonfocal. Normal muscle tone.  PSYCHIATRIC: Normal mood and affect. Normal behavior. Cooperative  CARDIAC DATABASE: EKG: 05/22/2020: Normal sinus rhythm, 75 bpm, normal axis, LVH per voltage criteria, without underlying injury pattern.    Echocardiogram: 06/04/2020:  Left ventricle cavity is normal in size. Moderate concentric hypertrophy of the left ventricle. Normal global wall motion. Normal LV systolic function with EF 68%. Doppler evidence of grade I (impaired) diastolic  dysfunction, normal LAP. Calculated EF 68%.  Left atrial cavity is mildly dilated.  Trileaflet aortic valve. Moderate (Grade III)  aortic regurgitation.  Mild tricuspid regurgitation.  Mild pulmonic regurgitation.  No evidence of pulmonary hypertension.  Large 8X8 echolucent structure seen in liver, likely a cyst.    Stress Testing: Lexiscan Tetrofosmin Stress Test 05/29/2020: Nondiagnostic ECG stress. Myocardial perfusion is normal. Overall LV systolic function is normal without regional wall motion abnormalities. Stress LV EF: 63%.  No previous exam available for comparison. Low risk.   Heart Catheterization: 18 years ago w/ Dr. Orie Rout back in [facility]. No records to review.   Carotid artery duplex 06/04/2020: Stenosis in the right internal carotid artery (16-49%). Stenosis in the right external carotid artery (<50%). Peak systolic velocities in the left bifurcation, internal, external and common carotid arteries are within normal limits. Antegrade right vertebral artery flow. Antegrade left vertebral artery flow. Follow up in one year is appropriate if clinically indicated.  LABORATORY DATA: CBC Latest Ref Rng & Units 11/07/2019  WBC 4.0 - 10.5 K/uL 3.7(L)  Hemoglobin 13.0 - 17.0 g/dL 12.9(L)  Hematocrit 39.0 - 52.0 % 40.8  Platelets 150 - 400 K/uL 178    CMP Latest Ref Rng & Units 11/07/2019  Glucose 70 - 99 mg/dL 100(H)  BUN 8 - 23 mg/dL 18  Creatinine 0.61 - 1.24 mg/dL 0.99  Sodium 135 - 145 mmol/L 140  Potassium 3.5 - 5.1 mmol/L 4.0  Chloride 98 - 111 mmol/L 104  CO2 22 - 32 mmol/L 26  Calcium 8.9 - 10.3 mg/dL 9.3  Total Protein 6.5 - 8.1 g/dL 7.0  Total Bilirubin 0.3 - 1.2 mg/dL 0.7  Alkaline Phos 38 - 126 U/L 56  AST 15 - 41 U/L 25  ALT 0 - 44 U/L 33    Lipid Panel  No results found for: CHOL, TRIG, HDL, CHOLHDL, VLDL, LDLCALC, LDLDIRECT, LABVLDL  No components found for: NTPROBNP No results for input(s): PROBNP in the last 8760 hours. No results for input(s): TSH in the last 8760 hours.  BMP Recent Labs    11/07/19 1154  NA 140  K 4.0  CL 104  CO2 26  GLUCOSE 100*  BUN 18   CREATININE 0.99  CALCIUM 9.3  GFRNONAA >60  GFRAA >60    HEMOGLOBIN A1C No results found for: HGBA1C, MPG  External Labs: Collected: 10/25/2019 Creatinine 0.99 mg/dL. eGFR: >60 mL/min per 1.73 m Potassium 4.2 AST 24, ALT 38, alkaline phosphatase 59 Lipid profile: Total cholesterol 105, triglycerides 54, HDL 49, LDL 43, non-HDL 56 Hemoglobin A1c: 5.6 TSH: 0.73,Free T4 1.3, and Free T3 2.8 BNP: 53   IMPRESSION:    ICD-10-CM   1. Fatigue, unspecified type  R53.83   2. Asymptomatic stenosis of right carotid artery  I65.21   3. Nonrheumatic aortic valve insufficiency  I35.1 ECHOCARDIOGRAM COMPLETE  4. Benign hypertension  I10   5. Hypercholesteremia  E78.00   6. Prostate cancer ([place])  C61   7. Former smoker  Z87.891      RECOMMENDATIONS: Doren Kaspar is a 75 y.o. male whose past medical history and cardiac risk factors include: Prostate Cancer (s/p surgery, radiation,  and hormone tx), Hypertension, Hyperlipidemia, advanced age, former smoker.  Fatigue/decreased physical endurance:  Given his fatigue and decreased physical endurance he was recommended to be evaluated by cardiology. Since last office visit he has undergone an echocardiogram and stress test. Results reviewed with him in great detail and noted above.   At the last office visit he was taking Bystolic 20 mg p.o. daily. This was reduced to 10 mg p.o. daily. Since then patient states that he does feel a bit more energetic, less tired and fatigue, and improvement in his physical endurance. However, he is not back to baseline.  No additional work-up recommended at this time. Patient is asked to follow-up with his PCP.   Aortic regurgitation, moderate:  Repeat echocardiogram in 6 months to reevaluate disease progression.  Continue to monitor  Right internal carotid artery stenosis, asymptomatic:  Continue aspirin and statin therapy.  Repeat carotid duplex scheduled for 1 year follow-up.  Benign essential  hypertension: Office blood pressure is well controlled.  Medications reconciled.  Educated on importance of a low-salt diet.  Currently managed by primary care provider.  Recommended follow up in 6 months after the echo to review the results and re-evaluate his symptoms.   FINAL MEDICATION LIST END OF ENCOUNTER: No orders of the defined types were placed in this encounter.    Current Outpatient Medications:  .  acetaminophen (TYLENOL) 500 MG tablet, Take 1,000 mg by mouth every 8 (eight) hours as needed for moderate pain., Disp: , Rfl:  .  aluminum hydroxide-magnesium carbonate (GAVISCON) 95-358 MG/15ML SUSP, Take 30 mLs by mouth as needed for indigestion or heartburn., Disp: , Rfl:  .  amLODipine (NORVASC) 10 MG tablet, Take 10 mg by mouth at bedtime., Disp: , Rfl:  .  ascorbic acid (VITAMIN C) 500 MG tablet, Take 500 mg by mouth daily., Disp: , Rfl:  .  aspirin 81 MG EC tablet, Take by mouth., Disp: , Rfl:  .  atorvastatin (LIPITOR) 40 MG tablet, Take 40 mg by mouth at bedtime., Disp: , Rfl:  .  azelastine (ASTELIN) 0.1 % nasal spray, Place 2 sprays into both nostrils 2 (two) times daily., Disp: 30 mL, Rfl: 5 .  Azelastine HCl 0.15 % SOLN, Place 1 spray into both nostrils 2 (two) times daily as needed for rhinitis. Use in each nostril as directed, Disp: , Rfl:  .  b complex vitamins tablet, Take 1 tablet by mouth daily., Disp: , Rfl:  .  cholecalciferol (VITAMIN D3) 25 MCG (1000 UNIT) tablet, Take 1,000 Units by mouth daily., Disp: , Rfl:  .  ECHINACEA EXTRACT PO, Take by mouth., Disp: , Rfl:  .  famotidine (PEPCID) 40 MG tablet, Take 40 mg by mouth at bedtime., Disp: , Rfl:  .  Magnesium 250 MG TABS, Take 250 mg by mouth daily., Disp: , Rfl:  .  Melatonin 5 MG CAPS, Take by mouth., Disp: , Rfl:  .  nebivolol (BYSTOLIC) 10 MG tablet, Take 1 tablet (10 mg total) by mouth in the morning., Disp: 90 tablet, Rfl: 0 .  Nutritional Supplements (JUICE PLUS FIBRE PO), Take 2 capsules by mouth  daily., Disp: , Rfl:  .  Probiotic Product (PROBIOTIC DAILY PO), Take 1 capsule by mouth daily., Disp: , Rfl:  .  valsartan-hydrochlorothiazide (DIOVAN-HCT) 320-12.5  MG tablet, Take 1 tablet by mouth daily., Disp: , Rfl:  .  zinc gluconate 50 MG tablet, Take 50 mg by mouth daily., Disp: , Rfl:   Orders Placed This Encounter  Procedures  . ECHOCARDIOGRAM COMPLETE   There are no Patient Instructions on file for this visit.   --Continue cardiac medications as reconciled in final medication list. --Return in about 28 weeks (around 01/01/2021) for Follow up after echo to evaluate AR. . Or sooner if needed. --Continue follow-up with your primary care physician regarding the management of your other chronic comorbid conditions.  Patient's questions and concerns were addressed to his satisfaction. He voices understanding of the instructions provided during this encounter.   This note was created using a voice recognition software as a result there may be grammatical errors inadvertently enclosed that do not reflect the nature of this encounter. Every attempt is made to correct such errors.  Rex Kras, [place], [facility]  Pager: [phone] Office: [phone]

## 2020-06-24 ENCOUNTER — Other Ambulatory Visit: Payer: Self-pay

## 2020-06-24 ENCOUNTER — Ambulatory Visit (INDEPENDENT_AMBULATORY_CARE_PROVIDER_SITE_OTHER): Payer: Medicare PPO | Admitting: Allergy & Immunology

## 2020-06-24 ENCOUNTER — Encounter: Payer: Self-pay | Admitting: Allergy & Immunology

## 2020-06-24 VITALS — BP 110/68 | HR 79 | Temp 97.2°F | Resp 17 | Ht 74.02 in | Wt 169.0 lb

## 2020-06-24 DIAGNOSIS — J310 Chronic rhinitis: Secondary | ICD-10-CM

## 2020-06-24 DIAGNOSIS — L299 Pruritus, unspecified: Secondary | ICD-10-CM

## 2020-06-24 MED ORDER — AZELASTINE HCL 0.1 % NA SOLN: 2.0000 | Freq: Two times a day (BID) | NASAL | 5 refills | Status: DC

## 2020-06-24 NOTE — Patient Instructions (Addendum)
1. Chronic rhinitis - We are going to get some blood testing for environmental allergens since your histamine was not super reactive today. - Make an appointment for two weeks for a skin testing appointment to complement the blood work. - In the meantime, continue with azelastine 1-2 sprays per nostril up to twice daily and Clarinex as needed. - We may have to start allergy shots again.   2. Return in about 2 weeks (around 07/08/2020) for skin testing.      Please inform us of any Emergency Department visits, hospitalizations, or changes in symptoms. Call us before going to the ED for breathing or allergy symptoms since we might be able to fit you in for a sick visit. Feel free to contact us anytime with any questions, problems, or concerns.  It was a pleasure to see you again today!  Websites that have reliable patient information: 1. American Academy of Asthma, Allergy, and Immunology: www.aaaai.org 2. Food Allergy Research and Education (FARE): foodallergy.org 3. Mothers of Asthmatics: http://www.asthmacommunitynetwork.org 4. American College of Allergy, Asthma, and Immunology: www.acaai.org   COVID-19 Vaccine Information can be found at: ShippingScam.co.uk For questions related to vaccine distribution or appointments, please email vaccine@[email or web address] .[email or web address] or call [phone].   We realize that you might be concerned about having an allergic reaction to the COVID19 vaccines. To help with that concern, WE ARE OFFERING THE COVID19 VACCINES IN OUR OFFICE! Ask the front desk for dates!     "Like" [place] on Facebook and Instagram for our latest updates!      A healthy democracy works best when [employer] participate! Make sure you are registered to vote! If you have moved or changed any of your contact information, you will need to get this updated before voting!  In some cases, you MAY be able to register to vote online:  [email or web address]

## 2020-06-24 NOTE — Progress Notes (Signed)
FOLLOW UP  Date of Service/Encounter:  06/24/20   Assessment:   Chronic rhinitis - with non reactive histamine (obtaining blood work instead)  Itching   Adverse food reaction (corn, pork) - sensitization only (clinically tolerates)  Plan/Recommendations:   1. Chronic rhinitis - We are going to get some blood testing for environmental allergens since your histamine was not super reactive today. - Make an appointment for two weeks for a skin testing appointment to complement the blood work. - In the meantime, continue with azelastine 1-2 sprays per nostril up to twice daily and Clarinex as needed. - We may have to start allergy shots again.   2. Return in about 2 weeks (around 07/08/2020) for skin testing.    Subjective:   Levert Heslop is a 75 y.o. male presenting today for follow up of  Chief Complaint  Patient presents with  . Allergic Rhinitis     Denario Bagot has a history of the following: Patient Active Problem List   Diagnosis Date Noted  . Myelopathy ([place]) 11/09/2019    History obtained from: chart review and patient.  Marbin is a 75 y.o. male presenting for a follow up visit.  He was last seen in October 2021 as a new patient.  At that time, we continued with Astelin 1 to 2 sprays per nostril up to twice daily and Clarinex as needed.  He had previously been on shots for 30 years he has not had on them for months and I saw him.  We discussed how this is not standard of care to keep you on shots for 30 years and he was open to stopping.  He had a history of a corn and pork allergy, but he was eating corn without a problem.  He also occasionally ate pork without a problem.  We did not do any testing at the last visit.  Since last visit, he has done well.   Allergic Rhinitis Symptom History: Starting in December, he started having a dry throat and sneezing. Then it clears up within 30 minutes or so. He does have a lot of boxes in the home. This seems to be  triggering a lot of his reactions. He has been wearing a mask when going through old papers and such.    He is using the azelastine nasal spray. He also uses the [place] nasal saline rinses which helps with the dryness of the time of the year. He does have Clarinex and last took it Saturday evening.  He thinks that he might need to restart allergy shots again.  He has not been able to control his symptoms without them.  His last allergy shots were before he moved to [place].  Itching is mostly improved.  He denies any rash.  Otherwise, there have been no changes to his past medical history, surgical history, family history, or social history.    Review of Systems  Constitutional: Negative.  Negative for chills, fever, malaise/fatigue and weight loss.  HENT: Negative for congestion, ear discharge, ear pain and sinus pain.   Eyes: Negative for pain, discharge and redness.  Respiratory: Negative for cough, sputum production, shortness of breath and wheezing.   Cardiovascular: Negative.  Negative for chest pain and palpitations.  Gastrointestinal: Negative for abdominal pain, constipation, diarrhea, heartburn, nausea and vomiting.  Skin: Positive for itching and rash.  Neurological: Negative for dizziness and headaches.  Endo/Heme/Allergies: Negative for environmental allergies. Does not bruise/bleed easily.       Objective:  Blood pressure 110/68, pulse 79, temperature (!) 97.2 F (36.2 C), temperature source Temporal, resp. rate 17, height 6' 2.02" (1.88 m), weight 169 lb (76.7 kg), SpO2 97 %. Body mass index is 21.69 kg/m.   Physical Exam:  Physical Exam Constitutional:      Appearance: He is well-developed.  HENT:     Head: Normocephalic and atraumatic.     Right Ear: Tympanic membrane, ear canal and external ear normal.     Left Ear: Tympanic membrane, ear canal and external ear normal.     Ears:     Comments: Hearing aids in place bilaterally.    Nose: Mucosal edema  and rhinorrhea present. No nasal deformity, septal deviation or epistaxis.     Right Turbinates: Enlarged, swollen and pale.     Left Turbinates: Enlarged, swollen and pale.     Right Sinus: No maxillary sinus tenderness or frontal sinus tenderness.     Left Sinus: No maxillary sinus tenderness or frontal sinus tenderness.     Comments: No polyps.  Clear discharge.    Mouth/Throat:     Mouth: Oropharynx is clear and moist. Mucous membranes are not pale and not dry.     Pharynx: Uvula midline.  Eyes:     General:        Right eye: No discharge.        Left eye: No discharge.     Extraocular Movements: EOM normal.     Conjunctiva/sclera: Conjunctivae normal.     Right eye: Right conjunctiva is not injected. No chemosis.    Left eye: Left conjunctiva is not injected. No chemosis.    Pupils: Pupils are equal, round, and reactive to light.  Cardiovascular:     Rate and Rhythm: Normal rate and regular rhythm.     Heart sounds: Normal heart sounds.  Pulmonary:     Effort: Pulmonary effort is normal. No tachypnea, accessory muscle usage or respiratory distress.     Breath sounds: Normal breath sounds. No wheezing, rhonchi or rales.     Comments: Moving air well in all lung fields. Chest:     Chest wall: No tenderness.  Lymphadenopathy:     Cervical: No cervical adenopathy.  Skin:    Coloration: Skin is not pale.     Findings: No abrasion, erythema, petechiae or rash. Rash is not papular, urticarial or vesicular.  Neurological:     Mental Status: He is alert.  Psychiatric:        Mood and Affect: Mood and affect normal.      Diagnostic studies:  Attempted skin testing, but histamine was nonreactive      Salvatore Marvel, MD  All[facility] and [place] [facility]

## 2020-06-28 LAB — ALLERGENS W/COMP RFLX AREA 2
Alternaria Alternata IgE: <0.1 kU/L
Aspergillus Fumigatus IgE: <0.1 kU/L
Bermuda Grass IgE: <0.1 kU/L
Cedar, Mountain IgE: <0.1 kU/L
Cladosporium Herbarum IgE: <0.1 kU/L
Cockroach, German IgE: <0.1 kU/L
Common Silver Birch IgE: <0.1 kU/L
Cottonwood IgE: <0.1 kU/L
D Farinae IgE: 33.8 kU/L — AB
D Pteronyssinus IgE: 19.1 kU/L — AB
E001-IgE Cat Dander: <0.1 kU/L
E005-IgE Dog Dander: <0.1 kU/L
Elm, American IgE: <0.1 kU/L
IgE (Immunoglobulin E), Serum: 378 IU/mL (ref 6–495)
Johnson Grass IgE: 0.11 kU/L — AB
Maple/Box Elder IgE: <0.1 kU/L
Mouse Urine IgE: <0.1 kU/L
Oak, White IgE: <0.1 kU/L
Pecan, Hickory IgE: <0.1 kU/L
Penicillium Chrysogen IgE: 0.14 kU/L — AB
Pigweed, Rough IgE: <0.1 kU/L
Ragweed, Short IgE: <0.1 kU/L
Sheep Sorrel IgE Qn: <0.1 kU/L
Timothy Grass IgE: 0.2 kU/L — AB
White Mulberry IgE: <0.1 kU/L

## 2020-06-28 LAB — ALLERGEN PROFILE, MOLD
Aureobasidi Pullulans IgE: <0.1 kU/L
Candida Albicans IgE: 0.21 kU/L — AB
M009-IgE Fusarium proliferatum: <0.1 kU/L
M014-IgE Epicoccum purpur: <0.1 kU/L
Mucor Racemosus IgE: <0.1 kU/L
Phoma Betae IgE: 0.32 kU/L — AB
Setomelanomma Rostrat: <0.1 kU/L
Stemphylium Herbarum IgE: <0.1 kU/L

## 2020-07-01 ENCOUNTER — Ambulatory Visit: Payer: Medicare PPO | Admitting: Psychologist

## 2020-07-05 NOTE — Patient Instructions (Incomplete)
1. Seasonal and perennial allergic rhinitis (lab work was positive to dust mite, grass pollens, and molds) Continue with azelastine 1-2 sprays per nostril up to twice daily as needed for runny nose/drainage  Continue Clarinex as needed for runny nose/itching. - We may have to start allergy shots again.   Please let us know if this treatment plan is not working well for you Schedule a follow up appointment in

## 2020-07-08 ENCOUNTER — Ambulatory Visit: Payer: Medicare PPO | Admitting: Family

## 2020-07-11 ENCOUNTER — Ambulatory Visit: Payer: Medicare PPO | Admitting: Family Medicine

## 2020-07-11 ENCOUNTER — Encounter: Payer: Self-pay | Admitting: Family Medicine

## 2020-07-11 ENCOUNTER — Other Ambulatory Visit: Payer: Self-pay

## 2020-07-11 VITALS — BP 108/62 | HR 83 | Temp 97.6°F | Resp 17 | Ht 74.02 in | Wt 170.0 lb

## 2020-07-11 DIAGNOSIS — J3089 Other allergic rhinitis: Secondary | ICD-10-CM | POA: Diagnosis not present

## 2020-07-11 NOTE — Progress Notes (Signed)
[facility] [place] [address] Dept: [phone]  FOLLOW UP NOTE  Patient ID: Kenneth Daniel, male    DOB: 11/01/1945  Age: 76 y.o. MRN: [id number] Date of Office Visit: 07/11/2020  Assessment  Chief Complaint: Allergy Testing  HPI Kenneth Daniel is a 75 year old male who presents to the clinic for follow-up visit.  He was last seen in this clinic on 06/24/2020 by Dr. Ernst Bowler for evaluation of rhinitis, itch, and possible skin testing to corn and pork, however, he is currently tolerating these foods.  At today's visit, he reports that allergic rhinitis as moderately well controlled with symptoms including nasal congestion, sneezing, and dry nostrils.  He continues Clarinex once a day and is using Flonase as needed.  He has poor application technique with Flonase.  He does report that he received allergen immunotherapy while living in [place] for 35 years with his last injection in May 2021.  He did moved to [place] on October 03, 2019 and did not experience symptoms of allergic rhinitis until December.  He completed blood work at his last office visit which indicated sensitivity to dust mite, grass pollen, and molds.  He is here today to follow-up with skin testing.  He reports he feels well at today's visit and has not had any antihistamines over the last 3 days.  His current medications are listed in the chart.  Drug Allergies:  No Known Allergies  Physical Exam: BP 108/62 (BP Location: Left Arm, Patient Position: Sitting, Cuff Size: Normal)   Pulse 83   Temp 97.6 F (36.4 C) (Temporal)   Resp 17   Ht 6' 2.02" (1.88 m)   Wt 170 lb (77.1 kg)   SpO2 96%   BMI 21.82 kg/m    Physical Exam Vitals reviewed.  Constitutional:      Appearance: Normal appearance.  HENT:     Head: Normocephalic and atraumatic.     Right Ear: Tympanic membrane normal.     Left Ear: Tympanic membrane normal.     Nose:     Comments: Bilateral nares edematous and pale with clear nasal drainage  noted.  Pharynx normal.  Ears normal.  Eyes normal.    Mouth/Throat:     Pharynx: Oropharynx is clear.  Eyes:     Conjunctiva/sclera: Conjunctivae normal.  Cardiovascular:     Rate and Rhythm: Normal rate and regular rhythm.     Heart sounds: Normal heart sounds. No murmur heard.   Pulmonary:     Effort: Pulmonary effort is normal.     Breath sounds: Normal breath sounds.     Comments: Lungs clear to auscultation Musculoskeletal:        General: Normal range of motion.     Cervical back: Normal range of motion and neck supple.  Skin:    General: Skin is warm and dry.  Neurological:     Mental Status: He is alert and oriented to person, place, and time.  Psychiatric:        Mood and Affect: Mood normal.        Behavior: Behavior normal.        Thought Content: Thought content normal.        Judgment: Judgment normal.     Diagnostics: Percutaneous environmental allergy testing positive to dust mites with adequate controls.  Percutaneous skin testing to corn and pork was negative with adequate controls.  Intradermal environmental allergy testing was positive to mold mix 2 with adequate controls.  Assessment and Plan: 1. Perennial  allergic rhinitis     Patient Instructions  Allergic rhinitis Your skin testing was positive to dust mites and mold. Continue Clarinex once a day as needed for runny nose or itch Continue Flonase 2 sprays in each nostril once a day as needed for a stuffy nose.  In the right nostril, point the applicator out toward the right ear. In the left nostril, point the applicator out toward the left ear Call the clinic if you are interested in starting allergy injections.   Call the clinic if this treatment plan is not working well for you  Follow up in 3 months or sooner if needed.    Return in about 3 months (around 10/11/2020), or if symptoms worsen or fail to improve.    Thank you for the opportunity to care for this patient.  Please do not  hesitate to contact me with questions.  Gareth Morgan, FNP [facility]

## 2020-07-11 NOTE — Patient Instructions (Signed)
Allergic rhinitis Your skin testing was positive to dust mites and mold. Continue Clarinex once a day as needed for runny nose or itch Continue Flonase 2 sprays in each nostril once a day as needed for a stuffy nose.  In the right nostril, point the applicator out toward the right ear. In the left nostril, point the applicator out toward the left ear Call the clinic if you are interested in starting allergy injections.   Call the clinic if this treatment plan is not working well for you  Follow up in 3 months or sooner if needed.

## 2020-08-13 ENCOUNTER — Ambulatory Visit: Payer: Medicare PPO | Admitting: Allergy & Immunology

## 2020-08-16 ENCOUNTER — Other Ambulatory Visit: Payer: Self-pay | Admitting: Allergy & Immunology

## 2020-08-17 ENCOUNTER — Other Ambulatory Visit: Payer: Self-pay | Admitting: Cardiology

## 2020-08-17 DIAGNOSIS — I10 Essential (primary) hypertension: Secondary | ICD-10-CM

## 2020-08-17 DIAGNOSIS — R5383 Other fatigue: Secondary | ICD-10-CM

## 2020-08-19 ENCOUNTER — Other Ambulatory Visit: Payer: Self-pay | Admitting: *Deleted

## 2020-08-19 NOTE — Telephone Encounter (Signed)
Called patient to let him know that I can send in a 30 day refill for the azelastine nasal spray but he must keep his appointment with gallagher on 10/29/20.

## 2020-09-01 DIAGNOSIS — Z87448 Personal history of other diseases of urinary system: Secondary | ICD-10-CM

## 2020-09-01 HISTORY — DX: Personal history of other diseases of urinary system: Z87.448

## 2020-09-19 ENCOUNTER — Ambulatory Visit: Payer: Medicare PPO | Admitting: Student

## 2020-09-19 ENCOUNTER — Encounter: Payer: Self-pay | Admitting: Student

## 2020-09-19 ENCOUNTER — Telehealth: Payer: Self-pay | Admitting: Student

## 2020-09-19 ENCOUNTER — Other Ambulatory Visit: Payer: Self-pay

## 2020-09-19 VITALS — BP 107/61 | HR 86 | Temp 98.7°F | Ht 74.0 in | Wt 184.0 lb

## 2020-09-19 DIAGNOSIS — R072 Precordial pain: Secondary | ICD-10-CM

## 2020-09-19 NOTE — Progress Notes (Signed)
 Date:  09/19/2020   ID:  Kenneth Daniel, DOB 12/06/1945, MRN [id number]  PCP:  Bland, Veita, MD  Cardiologist:  Celeste C Cantwell, DO, FACC (established care 05/22/2020) Former Cardiology Providers: Dr. Dov Linzer ([place])  Date: 09/19/20 Last Office Visit: 05/22/2020  Chief Complaint  Patient presents with  . Chest Pain  . Follow-up    Chest pain for 2 days     HPI  Kenneth Daniel is a 75 y.o. male who presents to the office with a chief complaint of " 1 month follow-up to reevaluate decreased physical endurance/fatigue and review test results." Patient's past medical history and cardiovascular risk factors include: Asymptomatic right-sided carotid artery stenosis, prostate Cancer (s/p surgery, radiation,  and hormone tx), Hypertension, Hyperlipidemia, advanced age, former smoker.  He was referred to the office at the request of Bland, Veita, MD for evaluation of decreased physical endurance.  Patient is originally from [place] and moved to [place] and was referred to the office by his PCP.  While in [place] he was following Dr. Linzer at least twice yearly for what appears to be cardiovascular risk stratification.  He had C-spine surgery 11/2019 and while undergoing rehab experienced decreased endurance with physical exertion. Prior to his C-spine surgery back in July 2021 he was walking 1-2 mile 2-3 times a week.   Due to decreased exercise tolerance patient underwent echocardiogram and stress test. He underwent Lexiscan which noted normal myocardial perfusion overall a low risk study.  Echocardiogram notes preserved LVEF with mild/moderate valvular heart disease. Patient was also noted to have right carotid bruit and underwent subsequent carotid artery duplex revealing <50% stenosis of right internal carotid artery.   Patient now presents for urgent visit with complaints of intermittent chest pain over the last 2 days.  Patient describes the chest pain as a "feeling of being poked  with a pen".  Patient states he had PT session on Friday where he did new weightlifting exercises with his upper body.  He then had stressful day Monday at the [facility].  Patient states starting Tuesday (2 days ago) he has been experiencing intermittent chest pain localized to left midclavicular line at the fifth intercostal space lasting <5 seconds and occurring several times per day.  He states this pain seems to come from frequently when he is lying on his left side.  Patient denies associated symptoms.  Denies dyspnea, palpitations, syncope, near syncope, dizziness.  He does report mild bilateral lower leg edema intermittent for the last several months. Denies orthopnea, PND.   FUNCTIONAL STATUS: Currently in physical therapy.    ALLERGIES: No Known Allergies  MEDICATION LIST PRIOR TO VISIT: Current Meds  Medication Sig  . acetaminophen (TYLENOL) 500 MG tablet Take 1,000 mg by mouth every 8 (eight) hours as needed for moderate pain.  . aluminum hydroxide-magnesium carbonate (GAVISCON) 95-358 MG/15ML SUSP Take 30 mLs by mouth as needed for indigestion or heartburn.  . amLODipine (NORVASC) 10 MG tablet Take 10 mg by mouth at bedtime.  . ascorbic acid (VITAMIN C) 500 MG tablet Take 500 mg by mouth daily.  . aspirin 81 MG EC tablet Take by mouth.  . atorvastatin (LIPITOR) 40 MG tablet Take 40 mg by mouth at bedtime.  . azelastine (ASTELIN) 0.1 % nasal spray PLACE 2 SPRAYS INTO BOTH NOSTRILS 2 (TWO) TIMES DAILY  . b complex vitamins tablet Take 1 tablet by mouth daily.  . cholecalciferol (VITAMIN D3) 25 MCG (1000 UNIT) tablet Take 1,000 Units by mouth daily.  .   ECHINACEA EXTRACT PO Take by mouth.  . famotidine (PEPCID) 40 MG tablet Take 40 mg by mouth at bedtime.  . Magnesium 250 MG TABS Take 250 mg by mouth daily.  . Melatonin 5 MG CAPS Take by mouth.  . nebivolol (BYSTOLIC) 10 MG tablet TAKE 1 TABLET (10 MG TOTAL) BY MOUTH IN THE MORNING.  . Nutritional Supplements (JUICE PLUS FIBRE PO) Take 2  capsules by mouth daily.  . Probiotic Product (PROBIOTIC DAILY PO) Take 1 capsule by mouth daily.  . valsartan-hydrochlorothiazide (DIOVAN-HCT) 320-12.5 MG tablet Take 1 tablet by mouth daily.  Marland Kitchen zinc gluconate 50 MG tablet Take 50 mg by mouth daily.     PAST MEDICAL HISTORY: Past Medical History:  Diagnosis Date  . Acid reflux   . Aortic regurgitation   . Borderline anemia   . 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 Hearing loss    wears hearing aids  . HLD (hyperlipidemia)   . Hypertension   . Prostate cancer ([facility])   . Wears glasses     PAST SURGICAL HISTORY: Past Surgical History:  Procedure Laterality Date  . ANTERIOR CERVICAL DECOMP/DISCECTOMY FUSION N/A 11/09/2019   Procedure: ANTERIOR CERVICAL DECOMPRESSION FUSION CERVICAL 3-4 WITH INSTRUMENTATION AND ALLOGRAFT;  Surgeon: Phylliss Bob, MD;  Location: [facility];  Service: Orthopedics;  Laterality: N/A;  . CARDIAC CATHETERIZATION    . COLONOSCOPY  2020  . cyst removed     back- benign  . PROSTATECTOMY  2004  . UPPER GI ENDOSCOPY  2021    FAMILY HISTORY: The patient family history includes Pancreatic cancer in his mother; Prostate cancer in his father.  SOCIAL HISTORY:  The patient  reports that he quit smoking about 48 years ago. His smoking use included cigarettes. He has never used smokeless tobacco. He reports that he does not drink alcohol and does not use drugs.  REVIEW OF SYSTEMS: Review of Systems  Constitutional: Positive for malaise/fatigue. Negative for chills and fever.  HENT: Negative for hoarse voice and nosebleeds.        Wears hearing aid.   Eyes: Negative for discharge, double vision and pain.  Cardiovascular: Positive for chest pain. Negative for claudication, dyspnea on exertion, leg swelling, near-syncope, orthopnea, palpitations, paroxysmal nocturnal dyspnea and syncope.  Respiratory: Negative for hemoptysis and shortness of breath.   Musculoskeletal: Negative for muscle  cramps and myalgias.  Gastrointestinal: Negative for abdominal pain, constipation, diarrhea, hematemesis, hematochezia, melena, nausea and vomiting.  Neurological: Negative for dizziness and light-headedness.    PHYSICAL EXAM: Vitals with BMI 09/19/2020 07/11/2020 06/24/2020  Height 6' 2" 6' 2.016" 6' 2.016"  Weight 184 lbs 170 lbs 169 lbs  BMI 23.61 19.14 78.29  Systolic 562 130 865  Diastolic 61 62 68  Pulse 86 83 79   CONSTITUTIONAL: Well-developed and well-nourished. No acute distress.  SKIN: Skin is warm and dry. No rash noted. No cyanosis. No pallor. No jaundice HEAD: Normocephalic and atraumatic.  EYES: No scleral icterus MOUTH/THROAT: Moist oral membranes.  NECK: No JVD present. No thyromegaly noted. Right carotid bruits  LYMPHATIC: No visible cervical adenopathy.  CHEST Normal respiratory effort. No intercostal retractions.  Mild tenderness to palpation left side of the chest at the midclavicular line fifth intercostal space. LUNGS: Clear to auscultation bilaterally. No stridor. No wheezes. No rales.  CARDIOVASCULAR: Regular rate and rhythm, positive S1-S2, 3 out of 6 diastolic murmur heard at the left sternal border. No rubs or gallops appreciated. ABDOMINAL: No apparent ascites.  EXTREMITIES: Trace  bilateral lower leg edema.  HEMATOLOGIC: No significant bruising NEUROLOGIC: Oriented to person, place, and time. Nonfocal. Normal muscle tone.  PSYCHIATRIC: Normal mood and affect. Normal behavior. Cooperative  CARDIAC DATABASE: EKG: 05/22/2020: Normal sinus rhythm, 75 bpm, normal axis, LVH per voltage criteria, without underlying injury pattern.   09/19/2020: Sinus rhythm at a rate of 82 bpm.  Normal axis.  LVH   Echocardiogram: 06/04/2020:  Left ventricle cavity is normal in size. Moderate concentric hypertrophy of the left ventricle. Normal global wall motion. Normal LV systolic function with EF 68%. Doppler evidence of grade I (impaired) diastolic  dysfunction, normal LAP.  Calculated EF 68%.  Left atrial cavity is mildly dilated.  Trileaflet aortic valve. Moderate (Grade III) aortic regurgitation.  Mild tricuspid regurgitation.  Mild pulmonic regurgitation.  No evidence of pulmonary hypertension.  Large 8X8 echolucent structure seen in liver, likely a cyst.    Stress Testing: Lexiscan Tetrofosmin Stress Test 05/29/2020: Nondiagnostic ECG stress. Myocardial perfusion is normal. Overall LV systolic function is normal without regional wall motion abnormalities. Stress LV EF: 63%.  No previous exam available for comparison. Low risk.   Heart Catheterization: 18 years ago w/ Dr. Dov Linzer back in [place]. No records to review.   Carotid artery duplex 06/04/2020: Stenosis in the right internal carotid artery (16-49%). Stenosis in the right external carotid artery (<50%). Peak systolic velocities in the left bifurcation, internal, external and common carotid arteries are within normal limits. Antegrade right vertebral artery flow. Antegrade left vertebral artery flow. Follow up in one year is appropriate if clinically indicated.  LABORATORY DATA: CBC Latest Ref Rng & Units 11/07/2019  WBC 4.0 - 10.5 K/uL 3.7(L)  Hemoglobin 13.0 - 17.0 g/dL 12.9(L)  Hematocrit 39.0 - 52.0 % 40.8  Platelets 150 - 400 K/uL 178    CMP Latest Ref Rng & Units 11/07/2019  Glucose 70 - 99 mg/dL 100(H)  BUN 8 - 23 mg/dL 18  Creatinine 0.61 - 1.24 mg/dL 0.99  Sodium 135 - 145 mmol/L 140  Potassium 3.5 - 5.1 mmol/L 4.0  Chloride 98 - 111 mmol/L 104  CO2 22 - 32 mmol/L 26  Calcium 8.9 - 10.3 mg/dL 9.3  Total Protein 6.5 - 8.1 g/dL 7.0  Total Bilirubin 0.3 - 1.2 mg/dL 0.7  Alkaline Phos 38 - 126 U/L 56  AST 15 - 41 U/L 25  ALT 0 - 44 U/L 33    Lipid Panel  No results found for: CHOL, TRIG, HDL, CHOLHDL, VLDL, LDLCALC, LDLDIRECT, LABVLDL  No components found for: NTPROBNP No results for input(s): PROBNP in the last 8760 hours. No results for input(s): TSH in the last 8760  hours.  BMP Recent Labs    11/07/19 1154  NA 140  K 4.0  CL 104  CO2 26  GLUCOSE 100*  BUN 18  CREATININE 0.99  CALCIUM 9.3  GFRNONAA >60  GFRAA >60    HEMOGLOBIN A1C No results found for: HGBA1C, MPG  External Labs: Collected: 10/25/2019 Creatinine 0.99 mg/dL. eGFR: >60 mL/min per 1.73 m Potassium 4.2 AST 24, ALT 38, alkaline phosphatase 59 Lipid profile: Total cholesterol 105, triglycerides 54, HDL 49, LDL 43, non-HDL 56 Hemoglobin A1c: 5.6 TSH: 0.73,Free T4 1.3, and Free T3 2.8 BNP: 53   IMPRESSION:    ICD-10-CM   1. Precordial pain  R07.2 EKG 12-Lead     RECOMMENDATIONS: Braun Marte is a 75 y.o. male whose past medical history and cardiac risk factors include: Prostate Cancer (s/p surgery, radiation,  and hormone   tx), Hypertension, Hyperlipidemia, advanced age, former smoker.  Chest pain:   Patient's chest pain symptoms are highly suggestive of musculoskeletal etiology.  EKG today is unchanged compared to previous and exam is reassuring.  In view of recent cardiovascular work-up including low risk stress test and echocardiogram with preserved LVEF less than 6 months ago, do not suspect cardiac etiology of patient's chest pain.  Reassured patient.  Also counseled him regarding signs and symptoms that warrant urgent/emergent evaluation, he verbalized understanding agreement.  Patient will notify our office if symptoms worsen or fail to improve.  Fatigue/decreased physical endurance:  Patient reports he continues to have increased energy and is progressing with physical therapy since reduction of Bystolic to 10 mg daily.  Patient continues to follow with PCP Dr. Bland.  Aortic regurgitation, moderate:  Repeat echocardiogram as previously recommended by Dr. Tolia.   Right internal carotid artery stenosis, asymptomatic:  Continue aspirin and statin therapy.  Repeat carotid duplex as previously recommended.   Benign essential hypertension:   Blood  pressure well controlled.    Recommend patient keep previously scheduled follow-up appointment with Dr. Tolia in August 2022.  FINAL MEDICATION LIST END OF ENCOUNTER: No orders of the defined types were placed in this encounter.    Current Outpatient Medications:  .  acetaminophen (TYLENOL) 500 MG tablet, Take 1,000 mg by mouth every 8 (eight) hours as needed for moderate pain., Disp: , Rfl:  .  aluminum hydroxide-magnesium carbonate (GAVISCON) 95-358 MG/15ML SUSP, Take 30 mLs by mouth as needed for indigestion or heartburn., Disp: , Rfl:  .  amLODipine (NORVASC) 10 MG tablet, Take 10 mg by mouth at bedtime., Disp: , Rfl:  .  ascorbic acid (VITAMIN C) 500 MG tablet, Take 500 mg by mouth daily., Disp: , Rfl:  .  aspirin 81 MG EC tablet, Take by mouth., Disp: , Rfl:  .  atorvastatin (LIPITOR) 40 MG tablet, Take 40 mg by mouth at bedtime., Disp: , Rfl:  .  azelastine (ASTELIN) 0.1 % nasal spray, PLACE 2 SPRAYS INTO BOTH NOSTRILS 2 (TWO) TIMES DAILY, Disp: 30 mL, Rfl: 0 .  b complex vitamins tablet, Take 1 tablet by mouth daily., Disp: , Rfl:  .  cholecalciferol (VITAMIN D3) 25 MCG (1000 UNIT) tablet, Take 1,000 Units by mouth daily., Disp: , Rfl:  .  ECHINACEA EXTRACT PO, Take by mouth., Disp: , Rfl:  .  famotidine (PEPCID) 40 MG tablet, Take 40 mg by mouth at bedtime., Disp: , Rfl:  .  Magnesium 250 MG TABS, Take 250 mg by mouth daily., Disp: , Rfl:  .  Melatonin 5 MG CAPS, Take by mouth., Disp: , Rfl:  .  nebivolol (BYSTOLIC) 10 MG tablet, TAKE 1 TABLET (10 MG TOTAL) BY MOUTH IN THE MORNING., Disp: 90 tablet, Rfl: 0 .  Nutritional Supplements (JUICE PLUS FIBRE PO), Take 2 capsules by mouth daily., Disp: , Rfl:  .  Probiotic Product (PROBIOTIC DAILY PO), Take 1 capsule by mouth daily., Disp: , Rfl:  .  valsartan-hydrochlorothiazide (DIOVAN-HCT) 320-12.5 MG tablet, Take 1 tablet by mouth daily., Disp: , Rfl:  .  zinc gluconate 50 MG tablet, Take 50 mg by mouth daily., Disp: , Rfl:   Orders  Placed This Encounter  Procedures  . EKG 12-Lead   There are no Patient Instructions on file for this visit.   --Continue cardiac medications as reconciled in final medication list. --No follow-ups on file. Or sooner if needed. --Continue follow-up with your primary care physician regarding the   management of your other chronic comorbid conditions.  Patient's questions and concerns were addressed to his satisfaction. He voices understanding of the instructions provided during this encounter.   This note was created using a voice recognition software as a result there may be grammatical errors inadvertently enclosed that do not reflect the nature of this encounter. Every attempt is made to correct such errors.   Alethia Berthold, PA-C 09/19/2020, 12:34 PM Office: [phone]

## 2020-09-21 ENCOUNTER — Other Ambulatory Visit: Payer: Self-pay

## 2020-09-21 ENCOUNTER — Encounter (HOSPITAL_COMMUNITY): Payer: Self-pay | Admitting: Emergency Medicine

## 2020-09-21 ENCOUNTER — Emergency Department (HOSPITAL_COMMUNITY)
Admission: EM | Admit: 2020-09-21 | Discharge: 2020-09-21 | Disposition: A | Discharge status: Home / Self Care | Payer: Medicare PPO | Source: Home / Self Care | Attending: Emergency Medicine | Admitting: Emergency Medicine

## 2020-09-21 DIAGNOSIS — I10 Essential (primary) hypertension: Secondary | ICD-10-CM | POA: Insufficient documentation

## 2020-09-21 DIAGNOSIS — N12 Tubulo-interstitial nephritis, not specified as acute or chronic: Secondary | ICD-10-CM | POA: Insufficient documentation

## 2020-09-21 DIAGNOSIS — Z79899 Other long term (current) drug therapy: Secondary | ICD-10-CM | POA: Insufficient documentation

## 2020-09-21 DIAGNOSIS — Z7982 Long term (current) use of aspirin: Secondary | ICD-10-CM | POA: Insufficient documentation

## 2020-09-21 DIAGNOSIS — Z87891 Personal history of nicotine dependence: Secondary | ICD-10-CM | POA: Insufficient documentation

## 2020-09-21 DIAGNOSIS — Z8546 Personal history of malignant neoplasm of prostate: Secondary | ICD-10-CM | POA: Insufficient documentation

## 2020-09-21 DIAGNOSIS — N10 Acute tubulo-interstitial nephritis: Secondary | ICD-10-CM | POA: Diagnosis not present

## 2020-09-21 LAB — CBC
HCT: 36.5 % — ABNORMAL LOW (ref 39.0–52.0)
Hemoglobin: 11.7 g/dL — ABNORMAL LOW (ref 13.0–17.0)
MCH: 28 pg (ref 26.0–34.0)
MCHC: 32.1 g/dL (ref 30.0–36.0)
MCV: 87.3 fL (ref 80.0–100.0)
Platelets: 170 10*3/uL (ref 150–400)
RBC: 4.18 MIL/uL — ABNORMAL LOW (ref 4.22–5.81)
RDW: 15 % (ref 11.5–15.5)
WBC: 9 10*3/uL (ref 4.0–10.5)
nRBC: 0 % (ref 0.0–0.2)

## 2020-09-21 LAB — URINALYSIS, ROUTINE W REFLEX MICROSCOPIC
Bilirubin Urine: NEGATIVE
Glucose, UA: NEGATIVE mg/dL
Ketones, ur: NEGATIVE mg/dL
Nitrite: NEGATIVE
Protein, ur: NEGATIVE mg/dL
Specific Gravity, Urine: 1.003 — ABNORMAL LOW (ref 1.005–1.030)
pH: 6 (ref 5.0–8.0)

## 2020-09-21 LAB — LACTIC ACID, PLASMA: Lactic Acid, Venous: 1 mmol/L (ref 0.5–1.9)

## 2020-09-21 LAB — COMPREHENSIVE METABOLIC PANEL
ALT: 21 U/L (ref 0–44)
AST: 17 U/L (ref 15–41)
Albumin: 3.5 g/dL (ref 3.5–5.0)
Alkaline Phosphatase: 74 U/L (ref 38–126)
Anion gap: 6 (ref 5–15)
BUN: 15 mg/dL (ref 8–23)
CO2: 25 mmol/L (ref 22–32)
Calcium: 9.1 mg/dL (ref 8.9–10.3)
Chloride: 103 mmol/L (ref 98–111)
Creatinine, Ser: 1.05 mg/dL (ref 0.61–1.24)
GFR, Estimated: >60 mL/min (ref >60)
Glucose, Bld: 126 mg/dL — ABNORMAL HIGH (ref 70–99)
Potassium: 3.7 mmol/L (ref 3.5–5.1)
Sodium: 134 mmol/L — ABNORMAL LOW (ref 135–145)
Total Bilirubin: 0.6 mg/dL (ref 0.3–1.2)
Total Protein: 7 g/dL (ref 6.5–8.1)

## 2020-09-21 LAB — LIPASE, BLOOD: Lipase: 35 U/L (ref 11–51)

## 2020-09-21 MED ORDER — SODIUM CHLORIDE 0.9 % IV SOLN
2.0000 g | Freq: Once | INTRAVENOUS | Status: AC
  Administered 2020-09-21: 2 g via INTRAVENOUS
  Filled 2020-09-21: qty 20

## 2020-09-21 MED ORDER — LACTATED RINGERS IV BOLUS
1000.0000 mL | Freq: Once | INTRAVENOUS | Status: AC
  Administered 2020-09-21: 1000 mL via INTRAVENOUS

## 2020-09-21 MED ORDER — ACETAMINOPHEN 325 MG PO TABS
650.0000 mg | ORAL_TABLET | Freq: Once | ORAL | Status: AC | PRN
  Administered 2020-09-21: 650 mg via ORAL
  Filled 2020-09-21: qty 2

## 2020-09-21 MED ORDER — CEPHALEXIN 500 MG PO CAPS: 500.0000 mg | ORAL_CAPSULE | Freq: Three times a day (TID) | ORAL | 0 refills | Status: DC

## 2020-09-21 NOTE — ED Notes (Signed)
Pt discharged and ambulated out of the ED without difficulty. 

## 2020-09-21 NOTE — ED Triage Notes (Signed)
Pt c/o left sided abdominal pain that radiates to his back and generalized weakness. Pt febrile in triage, pain worse when he does physical therapy.

## 2020-09-21 NOTE — ED Provider Notes (Signed)
[facility] EMERGENCY DEPARTMENT Provider Note   CSN: [id number] Arrival date & time: 09/21/20  1749     History Chief Complaint  Patient presents with  . Abdominal Pain    Kenneth Daniel is a 75 y.o. male.  HPI      75yo male with history of hypertension, hyperlipidemia, prostate cancer who presents with concern for fatigue, left flank pain, urinary symptoms.  Started having fatigue Tuesday, thought it was from standing in the heat on Monday waiting at [employer], started to slowly feel better until yesterday and today felt worse Left Flank discomfort, mild since Tuesday, no pain, like very mild soreness Had surgery neck in July, having PT, on Friday didn't go because felt weak Not aware of fever Sat in car for 2 hours prior to coming in, when got out of the car felt worse Set up appt with PCP and Orthopedic physician No cough, no rash, no n/v Felt like twisted something, first noticed when standing up at the [employer], Tuesday then it seemed like it was getting better than worsened on Friday  Urinary symptoms started Thursday  \    Past Medical History:  Diagnosis Date  . Acid reflux   . Aortic regurgitation   . Borderline anemia   . 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 Hearing loss    wears hearing aids  . HLD (hyperlipidemia)   . Hypertension   . Prostate cancer (HCC)   . Wears glasses     Patient Active Problem List   Diagnosis Date Noted  . Acute pyelonephritis 09/22/2020  . Pyelonephritis 09/22/2020  . Myelopathy (HCC) 11/09/2019    Past Surgical History:  Procedure Laterality Date  . ANTERIOR CERVICAL DECOMP/DISCECTOMY FUSION N/A 11/09/2019   Procedure: ANTERIOR CERVICAL DECOMPRESSION FUSION CERVICAL 3-4 WITH INSTRUMENTATION AND ALLOGRAFT;  Surgeon: Estill Bamberg, MD;  Location: [facility] OR;  Service: Orthopedics;  Laterality: N/A;  . CARDIAC CATHETERIZATION    . COLONOSCOPY  2020  . cyst removed      back- benign  . PROSTATECTOMY  2004  . UPPER GI ENDOSCOPY  2021       Family History  Problem Relation Age of Onset  . Pancreatic cancer Mother   . Prostate cancer Father     Social History   Tobacco Use  . Smoking status: Former Smoker    Types: Cigarettes    Quit date: 1974    Years since quitting: 48.4  . Smokeless tobacco: Never Used  Vaping Use  . Vaping Use: Never used  Substance Use Topics  . Alcohol use: Never  . Drug use: Never    Home Medications Prior to Admission medications   Medication Sig Start Date End Date Taking? Authorizing Provider  acetaminophen (TYLENOL) 500 MG tablet Take 1,000 mg by mouth every 8 (eight) hours as needed for moderate pain.   Yes [provider]  aluminum hydroxide-magnesium carbonate (GAVISCON) 95-358 MG/15ML SUSP Take 30 mLs by mouth as needed for indigestion or heartburn.   Yes [provider]  amLODipine (NORVASC) 10 MG tablet Take 10 mg by mouth at bedtime.   Yes [provider]  ascorbic acid (VITAMIN C) 500 MG tablet Take 500 mg by mouth daily.   Yes [provider]  aspirin 81 MG EC tablet Take 81 mg by mouth daily.   Yes [provider]  atorvastatin (LIPITOR) 40 MG tablet Take 40 mg by mouth at bedtime.   Yes [provider]  azelastine (ASTELIN) 0.1 % nasal spray PLACE 2 SPRAYS INTO BOTH NOSTRILS 2 (TWO) TIMES DAILY 08/19/20  Yes Valentina Shaggy, MD  b complex vitamins tablet Take 1 tablet by mouth daily.   Yes [provider]  cephALEXin (KEFLEX) 500 MG capsule Take 1 capsule (500 mg total) by mouth 3 (three) times daily for 14 days. 09/21/20 10/05/20 Yes Gareth Morgan, MD  cholecalciferol (VITAMIN D3) 25 MCG (1000 UNIT) tablet Take 1,000 Units by mouth daily.   Yes [provider]  ECHINACEA EXTRACT PO Take 1 capsule by mouth daily as needed (cold symptoms).   Yes [provider]  Magnesium 250 MG TABS Take 250 mg by mouth daily.   Yes  [provider]  Melatonin 5 MG CAPS Take 10 mg by mouth at bedtime.   Yes [provider]  nebivolol (BYSTOLIC) 10 MG tablet TAKE 1 TABLET (10 MG TOTAL) BY MOUTH IN THE MORNING. Patient taking differently: Take 10 mg by mouth daily. 08/19/20 11/17/20 Yes Tolia, Sunit, DO  Nutritional Supplements (JUICE PLUS FIBRE PO) Take 2 capsules by mouth daily.   Yes [provider]  Probiotic Product (PROBIOTIC DAILY PO) Take 1 capsule by mouth daily.   Yes [provider]  valsartan-hydrochlorothiazide (DIOVAN-HCT) 320-12.5 MG tablet Take 1 tablet by mouth daily. 02/21/20  Yes [provider]  zinc gluconate 50 MG tablet Take 50 mg by mouth daily as needed (supplement).    [provider]    Allergies    Patient has no known allergies.  Review of Systems   Review of Systems  Constitutional: Positive for fatigue and fever (on arrival).  Respiratory: Negative for cough and shortness of breath.   Cardiovascular: Negative for chest pain.  Gastrointestinal: Negative for constipation, diarrhea, nausea and vomiting. Abdominal pain: just discofort left flank.  Genitourinary: Positive for flank pain.  Skin: Negative for rash.    Physical Exam Updated Vital Signs BP 132/82   Pulse (!) 106   Temp 98.8 F (37.1 C) (Oral)   Resp 19   SpO2 95%   Physical Exam Vitals and nursing note reviewed.  Constitutional:      General: He is not in acute distress.    Appearance: He is well-developed. He is not diaphoretic.  HENT:     Head: Normocephalic and atraumatic.  Eyes:     Conjunctiva/sclera: Conjunctivae normal.  Cardiovascular:     Rate and Rhythm: Normal rate and regular rhythm.     Heart sounds: Normal heart sounds. No murmur heard. No friction rub. No gallop.   Pulmonary:     Effort: Pulmonary effort is normal. No respiratory distress.     Breath sounds: Normal breath sounds. No wheezing or rales.  Abdominal:     General: There is no  distension.     Palpations: Abdomen is soft.     Tenderness: There is abdominal tenderness. There is left CVA tenderness. There is no guarding.  Musculoskeletal:     Cervical back: Normal range of motion.  Skin:    General: Skin is warm and dry.  Neurological:     Mental Status: He is alert and oriented to person, place, and time.     ED Results / Procedures / Treatments   Labs (all labs ordered are listed, but only abnormal results are displayed) Labs Reviewed  COMPREHENSIVE METABOLIC PANEL - Abnormal; Notable for the following components:      Result Value   Sodium 134 (*)    Glucose, Bld  126 (*)    All other components within normal limits  CBC - Abnormal; Notable for the following components:   RBC 4.18 (*)    Hemoglobin 11.7 (*)    HCT 36.5 (*)    All other components within normal limits  URINALYSIS, ROUTINE W REFLEX MICROSCOPIC - Abnormal; Notable for the following components:   Color, Urine STRAW (*)    Specific Gravity, Urine 1.003 (*)    Hgb urine dipstick MODERATE (*)    Leukocytes,Ua LARGE (*)    Bacteria, UA RARE (*)    All other components within normal limits  CULTURE, BLOOD (ROUTINE X 2)  CULTURE, BLOOD (ROUTINE X 2)  LIPASE, BLOOD  LACTIC ACID, PLASMA    EKG None  Radiology CT ABDOMEN W CONTRAST  Result Date: 09/22/2020 CLINICAL DATA:  Severe left flank pain. Left pyelonephritis. Indeterminate cystic renal lesions on recent CT. Evaluate for renal abscess or neoplasm. EXAM: CT ABDOMEN WITHOUT AND WITH CONTRAST TECHNIQUE: Multidetector CT imaging of the abdomen was performed following the standard protocol before and following the bolus administration of intravenous contrast. CONTRAST:  55mL OMNIPAQUE IOHEXOL 350 MG/ML SOLN COMPARISON:  Noncontrast CT 09/22/2020 and 01/04/2020 FINDINGS: Lower chest: Increased bibasilar subsegmental atelectasis. Hepatobiliary: No hepatic masses identified. Multiple benign hepatic cysts are again seen, largest in the left  lobe measuring 8.5 cm. Gallbladder is unremarkable. No evidence of biliary ductal dilatation. Pancreas:  No mass or inflammatory changes. Spleen:  Within normal limits in size and appearance. Adrenals/Urinary Tract: Normal adrenal glands. A few benign right renal cysts are again seen. Multiple left renal cysts are also seen, with dominant mildly complex cyst in the anterior midpole of the left kidney which shows thin peripheral wall enhancement and a few thin internal septations. No thickened septations or solid component identified. This has features of a usually benign Bosniak category 2 F cystic lesion. This measures 7.0 x 6.8 cm, compared to 6.2 x 4.9 cm on 01/04/2020 exam. No evidence hydronephrosis. Asymmetric left perinephric soft tissue stranding is seen, however there is no evidence hydronephrosis. Stomach/Bowel: Visualized portion unremarkable. Vascular/Lymphatic: No pathologically enlarged lymph nodes identified. No abdominal aortic aneurysm. Aortic atherosclerotic calcification noted. Other:  None. Musculoskeletal:  No suspicious bone lesions identified. IMPRESSION: Asymmetric left perinephric soft tissue stranding. No evidence of hydronephrosis. This is suspicious for pyelonephritis. Recommend correlation with urinalysis. No evidence of solid renal mass or abscess. Indeterminate but usually benign Bosniak category 49F cystic lesion of left kidney. This shows no change in morphology, but is mildly increased in size since prior study on 01/04/2020. Recommend continued imaging follow-up in 6 months, preferably with abdomen MRI without and with contrast. This recommendation follows ACR consensus guidelines: Management of the Incidental Renal Mass on CT: A White Paper of the ACR Incidental Findings Committee. J Am Coll Radiol [phone]. Stable benign hepatic cysts. Aortic Atherosclerosis (ICD10-I70.0). Electronically Signed   By: Marlaine Hind M.D.   On: 09/22/2020 18:21   DG Chest Portable 1  View  Result Date: 09/22/2020 CLINICAL DATA:  Upper abdominal pain. EXAM: PORTABLE CHEST 1 VIEW COMPARISON:  None. FINDINGS: Linear bibasilar atelectasis. No confluent consolidation. Normal heart size and mediastinal contours. No pulmonary edema, pleural effusion, or pneumothorax. No acute osseous abnormalities are seen. IMPRESSION: Linear bibasilar atelectasis. Electronically Signed   By: Keith Rake M.D.   On: 09/22/2020 16:27   CT Renal Stone Study  Result Date: 09/22/2020 CLINICAL DATA:  Left flank and left lower quadrant pain. Clinical suspicion for a kidney stone.  EXAM: CT ABDOMEN AND PELVIS WITHOUT CONTRAST TECHNIQUE: Multidetector CT imaging of the abdomen and pelvis was performed following the standard protocol without IV contrast. COMPARISON:  01/04/2020. FINDINGS: Lower chest: Mild bronchiectasis. Linear areas of atelectasis noted at both bases, new from the prior exam. Hepatobiliary: Liver normal in overall size. Multiple cysts, largest arising from the left lobe, 9.0 x 7.3 cm. Cysts stable from the prior CT. Gallbladder minimally distended. No convincing stones. No wall thickening or adjacent inflammation. No bile duct dilation. Pancreas: Unremarkable. No pancreatic ductal dilatation or surrounding inflammatory changes. Spleen: Normal in size without focal abnormality. Adrenals/Urinary Tract: No adrenal masses. Heterogeneous mass arises from the anterior midpole of the left kidney, measuring 6.9 x 6.8 cm transversely by 7.6 cm superior to inferior. This measured 5.1 x 4.8 cm transversely on the prior exam and was homogeneously low in attenuation. Current average Hounsfield units are 25, previously 15. There is adjacent perinephric stranding and extends to the anterior pararenal fascia. There are additional low-attenuation renal masses that are stable consistent with cysts. No renal stones. No hydronephrosis. Ureters normal in course and in caliber. Bladder is unremarkable. Stomach/Bowel:  Normal stomach. Small bowel is normal in caliber. A loop of small bowel extends into a right inguinal hernia without evidence of obstruction or incarceration. There is a small amount of associated fluid. No small bowel wall thickening or adjacent inflammation. Colon is normal in caliber. There is mild to moderate increase in the colonic stool burden. No colonic wall thickening or inflammation. No evidence of appendicitis. Vascular/Lymphatic: Mild aortic atherosclerosis. No aneurysm. No enlarged lymph nodes. Reproductive: Status post prostatectomy. Other: No other abdominal wall hernias.  No ascites. Musculoskeletal: No fracture or acute finding. No osteoblastic or osteolytic lesions. IMPRESSION: 1. Left renal mass, heterogeneous in attenuation and associated with adjacent inflammation. This has increased in size from the prior CT with the heterogeneity and inflammation being new. Suspect this is a hemorrhagic renal cyst. However, a solid renal mass is not excluded based on this unenhanced study. This could be further assessed/characterized with renal ultrasound, but would be best characterized with renal MRI without and with contrast. 2. No other evidence of an acute abnormality. No renal or ureteral stones or obstructive uropathy. 3. Aortic atherosclerosis. 4. Mild to moderate increase in the colonic stool burden which is similar to the prior CT. Electronically Signed   By: Lajean Manes M.D.   On: 09/22/2020 13:02    Procedures Procedures   Medications Ordered in ED Medications  acetaminophen (TYLENOL) tablet 650 mg (650 mg Oral Given 09/21/20 1823)  lactated ringers bolus 1,000 mL (0 mLs Intravenous Stopped 09/21/20 2327)  cefTRIAXone (ROCEPHIN) 2 g in sodium chloride 0.9 % 100 mL IVPB (0 g Intravenous Stopped 09/21/20 2307)    ED Course  I have reviewed the triage vital signs and the nursing notes.  Pertinent labs & imaging results that were available during my care of the patient were reviewed by  me and considered in my medical decision making (see chart for details).    MDM Rules/Calculators/A&P                          75yo male with history of hypertension, hyperlipidemia, prostate cancer who presents with concern for fatigue, left flank pain, urinary symptoms.  On arrival to the emergency department, temperature was 102.9, and he is tachycardic to 106.  Labs obtained for sepsis evaluation show a normal lactic acid, no leukocytosis, no  acute renal failure.  Urinalysis is consistent with urinary tract infection.  Feel his history of generalized weakness, urinary symptoms, flank pain and fever are consistent with pyelonephritis.  He does not have significant pain or discomfort to suggest nephrolithiasis or perinephric abscess.  Given IV fluid and Rocephin in the emergency department.  He is stable for close outpatient follow-up and strict return precautions for pyelonephritis.  Blood cultures ordered and pending.  Given prescription for Keflex. Patient discharged in stable condition with understanding of reasons to return.    Final Clinical Impression(s) / ED Diagnoses Final diagnoses:  Pyelonephritis    Rx / DC Orders ED Discharge Orders         Ordered    cephALEXin (KEFLEX) 500 MG capsule  3 times daily        09/21/20 2247           Gareth Morgan, MD 09/23/20 [phone]

## 2020-09-22 ENCOUNTER — Observation Stay (HOSPITAL_COMMUNITY): Payer: Medicare PPO

## 2020-09-22 ENCOUNTER — Other Ambulatory Visit: Payer: Self-pay

## 2020-09-22 ENCOUNTER — Encounter (HOSPITAL_COMMUNITY): Payer: Self-pay

## 2020-09-22 ENCOUNTER — Emergency Department (HOSPITAL_COMMUNITY): Payer: Medicare PPO

## 2020-09-22 ENCOUNTER — Inpatient Hospital Stay (HOSPITAL_COMMUNITY)
Admission: EM | Admit: 2020-09-22 | Discharge: 2020-09-25 | DRG: 690 | Disposition: A | Discharge status: Home / Self Care | Payer: Medicare PPO | Attending: Student | Admitting: Student

## 2020-09-22 DIAGNOSIS — Z20822 Contact with and (suspected) exposure to covid-19: Secondary | ICD-10-CM | POA: Diagnosis present

## 2020-09-22 DIAGNOSIS — Z7982 Long term (current) use of aspirin: Secondary | ICD-10-CM

## 2020-09-22 DIAGNOSIS — Z87891 Personal history of nicotine dependence: Secondary | ICD-10-CM

## 2020-09-22 DIAGNOSIS — Z981 Arthrodesis status: Secondary | ICD-10-CM

## 2020-09-22 DIAGNOSIS — E785 Hyperlipidemia, unspecified: Secondary | ICD-10-CM | POA: Diagnosis present

## 2020-09-22 DIAGNOSIS — I5032 Chronic diastolic (congestive) heart failure: Secondary | ICD-10-CM | POA: Diagnosis present

## 2020-09-22 DIAGNOSIS — N281 Cyst of kidney, acquired: Secondary | ICD-10-CM | POA: Diagnosis present

## 2020-09-22 DIAGNOSIS — Z9079 Acquired absence of other genital organ(s): Secondary | ICD-10-CM

## 2020-09-22 DIAGNOSIS — N10 Acute tubulo-interstitial nephritis: Secondary | ICD-10-CM | POA: Diagnosis not present

## 2020-09-22 DIAGNOSIS — H9190 Unspecified hearing loss, unspecified ear: Secondary | ICD-10-CM | POA: Diagnosis present

## 2020-09-22 DIAGNOSIS — Z8042 Family history of malignant neoplasm of prostate: Secondary | ICD-10-CM

## 2020-09-22 DIAGNOSIS — Z79899 Other long term (current) drug therapy: Secondary | ICD-10-CM

## 2020-09-22 DIAGNOSIS — I110 Hypertensive heart disease with heart failure: Secondary | ICD-10-CM | POA: Diagnosis present

## 2020-09-22 DIAGNOSIS — Z8546 Personal history of malignant neoplasm of prostate: Secondary | ICD-10-CM

## 2020-09-22 DIAGNOSIS — Z974 Presence of external hearing-aid: Secondary | ICD-10-CM

## 2020-09-22 DIAGNOSIS — K56609 Unspecified intestinal obstruction, unspecified as to partial versus complete obstruction: Secondary | ICD-10-CM

## 2020-09-22 DIAGNOSIS — K219 Gastro-esophageal reflux disease without esophagitis: Secondary | ICD-10-CM | POA: Diagnosis present

## 2020-09-22 DIAGNOSIS — N12 Tubulo-interstitial nephritis, not specified as acute or chronic: Secondary | ICD-10-CM | POA: Diagnosis present

## 2020-09-22 LAB — CBC WITH DIFFERENTIAL/PLATELET
Abs Immature Granulocytes: 0.09 10*3/uL — ABNORMAL HIGH (ref 0.00–0.07)
Basophils Absolute: 0 10*3/uL (ref 0.0–0.1)
Basophils Relative: 0 %
Eosinophils Absolute: 0 10*3/uL (ref 0.0–0.5)
Eosinophils Relative: 0 %
HCT: 37.9 % — ABNORMAL LOW (ref 39.0–52.0)
Hemoglobin: 12.3 g/dL — ABNORMAL LOW (ref 13.0–17.0)
Immature Granulocytes: 1 %
Lymphocytes Relative: 2 %
Lymphs Abs: 0.4 10*3/uL — ABNORMAL LOW (ref 0.7–4.0)
MCH: 28.6 pg (ref 26.0–34.0)
MCHC: 32.5 g/dL (ref 30.0–36.0)
MCV: 88.1 fL (ref 80.0–100.0)
Monocytes Absolute: 2.1 10*3/uL — ABNORMAL HIGH (ref 0.1–1.0)
Monocytes Relative: 13 %
Neutro Abs: 13.3 10*3/uL — ABNORMAL HIGH (ref 1.7–7.7)
Neutrophils Relative %: 84 %
Platelets: 166 10*3/uL (ref 150–400)
RBC: 4.3 MIL/uL (ref 4.22–5.81)
RDW: 15.1 % (ref 11.5–15.5)
WBC: 15.9 10*3/uL — ABNORMAL HIGH (ref 4.0–10.5)
nRBC: 0 % (ref 0.0–0.2)

## 2020-09-22 LAB — COMPREHENSIVE METABOLIC PANEL
ALT: 21 U/L (ref 0–44)
AST: 19 U/L (ref 15–41)
Albumin: 3.2 g/dL — ABNORMAL LOW (ref 3.5–5.0)
Alkaline Phosphatase: 76 U/L (ref 38–126)
Anion gap: 9 (ref 5–15)
BUN: 14 mg/dL (ref 8–23)
CO2: 26 mmol/L (ref 22–32)
Calcium: 8.9 mg/dL (ref 8.9–10.3)
Chloride: 99 mmol/L (ref 98–111)
Creatinine, Ser: 1.02 mg/dL (ref 0.61–1.24)
GFR, Estimated: >60 mL/min (ref >60)
Glucose, Bld: 137 mg/dL — ABNORMAL HIGH (ref 70–99)
Potassium: 3.7 mmol/L (ref 3.5–5.1)
Sodium: 134 mmol/L — ABNORMAL LOW (ref 135–145)
Total Bilirubin: 1.2 mg/dL (ref 0.3–1.2)
Total Protein: 7.5 g/dL (ref 6.5–8.1)

## 2020-09-22 LAB — URINALYSIS, ROUTINE W REFLEX MICROSCOPIC
Bilirubin Urine: NEGATIVE
Glucose, UA: NEGATIVE mg/dL
Ketones, ur: 5 mg/dL — AB
Nitrite: NEGATIVE
Protein, ur: 100 mg/dL — AB
Specific Gravity, Urine: 1.02 (ref 1.005–1.030)
WBC, UA: >50 WBC/hpf — ABNORMAL HIGH (ref 0–5)
pH: 5 (ref 5.0–8.0)

## 2020-09-22 LAB — LACTIC ACID, PLASMA
Lactic Acid, Venous: 2.2 mmol/L (ref 0.5–1.9)
Lactic Acid, Venous: 2.7 mmol/L (ref 0.5–1.9)

## 2020-09-22 LAB — LIPASE, BLOOD: Lipase: 32 U/L (ref 11–51)

## 2020-09-22 LAB — RESP PANEL BY RT-PCR (FLU A&B, COVID) ARPGX2
Influenza A by PCR: NEGATIVE
Influenza B by PCR: NEGATIVE
SARS Coronavirus 2 by RT PCR: NEGATIVE

## 2020-09-22 MED ORDER — ONDANSETRON HCL 4 MG/2ML IJ SOLN
4.0000 mg | Freq: Once | INTRAMUSCULAR | Status: AC
  Administered 2020-09-22: 4 mg via INTRAVENOUS
  Filled 2020-09-22: qty 2

## 2020-09-22 MED ORDER — ACETAMINOPHEN 650 MG RE SUPP: 650.0000 mg | Freq: Four times a day (QID) | RECTAL | Status: DC | PRN

## 2020-09-22 MED ORDER — ONDANSETRON HCL 4 MG/2ML IJ SOLN: 4.0000 mg | Freq: Once | INTRAMUSCULAR | Status: DC

## 2020-09-22 MED ORDER — IOHEXOL 350 MG/ML SOLN
50.0000 mL | Freq: Once | INTRAVENOUS | Status: AC | PRN
  Administered 2020-09-22: 50 mL via INTRAVENOUS

## 2020-09-22 MED ORDER — NEBIVOLOL HCL 10 MG PO TABS
10.0000 mg | ORAL_TABLET | Freq: Every morning | ORAL | Status: DC
  Administered 2020-09-23 – 2020-09-25 (×3): 10 mg via ORAL
  Filled 2020-09-22 (×4): qty 1

## 2020-09-22 MED ORDER — B COMPLEX PO TABS: 1.0000 | ORAL_TABLET | Freq: Every day | ORAL | Status: DC

## 2020-09-22 MED ORDER — LACTATED RINGERS IV BOLUS
250.0000 mL | Freq: Once | INTRAVENOUS | Status: AC
  Administered 2020-09-22: 250 mL via INTRAVENOUS

## 2020-09-22 MED ORDER — HYDRALAZINE HCL 25 MG PO TABS: 25.0000 mg | ORAL_TABLET | Freq: Four times a day (QID) | ORAL | Status: DC | PRN

## 2020-09-22 MED ORDER — SODIUM CHLORIDE 0.9 % IV SOLN
2.0000 g | INTRAVENOUS | Status: DC
  Administered 2020-09-22 – 2020-09-24 (×3): 2 g via INTRAVENOUS
  Filled 2020-09-22 (×3): qty 20

## 2020-09-22 MED ORDER — ALUM & MAG HYDROXIDE-SIMETH 200-200-20 MG/5ML PO SUSP
5.0000 mL | ORAL | Status: DC | PRN
  Administered 2020-09-23: 5 mL via ORAL
  Filled 2020-09-22: qty 30

## 2020-09-22 MED ORDER — ATORVASTATIN CALCIUM 40 MG PO TABS
40.0000 mg | ORAL_TABLET | Freq: Every day | ORAL | Status: DC
  Administered 2020-09-22 – 2020-09-24 (×3): 40 mg via ORAL
  Filled 2020-09-22 (×3): qty 1

## 2020-09-22 MED ORDER — ASCORBIC ACID 500 MG PO TABS
500.0000 mg | ORAL_TABLET | Freq: Every day | ORAL | Status: DC
  Administered 2020-09-24 – 2020-09-25 (×2): 500 mg via ORAL
  Filled 2020-09-22 (×3): qty 1

## 2020-09-22 MED ORDER — RISAQUAD PO CAPS
1.0000 | ORAL_CAPSULE | Freq: Every day | ORAL | Status: DC
  Administered 2020-09-22 – 2020-09-25 (×3): 1 via ORAL
  Filled 2020-09-22 (×4): qty 1

## 2020-09-22 MED ORDER — ONDANSETRON HCL 4 MG/2ML IJ SOLN
4.0000 mg | Freq: Four times a day (QID) | INTRAMUSCULAR | Status: DC | PRN
  Administered 2020-09-22 – 2020-09-23 (×3): 4 mg via INTRAVENOUS
  Filled 2020-09-22 (×3): qty 2

## 2020-09-22 MED ORDER — AZELASTINE HCL 0.1 % NA SOLN
2.0000 | Freq: Two times a day (BID) | NASAL | Status: DC
  Administered 2020-09-22 – 2020-09-25 (×2): 2 via NASAL
  Filled 2020-09-22: qty 30

## 2020-09-22 MED ORDER — SODIUM CHLORIDE 0.9 % IV SOLN: INTRAVENOUS | Status: AC

## 2020-09-22 MED ORDER — ASPIRIN EC 81 MG PO TBEC
81.0000 mg | DELAYED_RELEASE_TABLET | Freq: Every day | ORAL | Status: DC
  Administered 2020-09-22 – 2020-09-24 (×3): 81 mg via ORAL
  Filled 2020-09-22 (×3): qty 1

## 2020-09-22 MED ORDER — ACETAMINOPHEN 500 MG PO TABS: 1000.0000 mg | ORAL_TABLET | Freq: Three times a day (TID) | ORAL | Status: DC | PRN

## 2020-09-22 MED ORDER — AMLODIPINE BESYLATE 10 MG PO TABS
10.0000 mg | ORAL_TABLET | Freq: Every day | ORAL | Status: DC
  Administered 2020-09-22 – 2020-09-24 (×3): 10 mg via ORAL
  Filled 2020-09-22 (×3): qty 1

## 2020-09-22 MED ORDER — SODIUM CHLORIDE 0.9 % IV BOLUS: 1000.0000 mL | Freq: Once | INTRAVENOUS | Status: DC

## 2020-09-22 MED ORDER — MORPHINE SULFATE (PF) 4 MG/ML IV SOLN
4.0000 mg | Freq: Once | INTRAVENOUS | Status: AC
  Administered 2020-09-22: 4 mg via INTRAVENOUS
  Filled 2020-09-22: qty 1

## 2020-09-22 MED ORDER — HYDROMORPHONE HCL 1 MG/ML IJ SOLN: 0.5000 mg | INTRAMUSCULAR | Status: DC | PRN

## 2020-09-22 MED ORDER — ONDANSETRON HCL 4 MG PO TABS
4.0000 mg | ORAL_TABLET | Freq: Four times a day (QID) | ORAL | Status: DC | PRN
  Administered 2020-09-23: 4 mg via ORAL
  Filled 2020-09-22: qty 1

## 2020-09-22 MED ORDER — VITAMIN D 25 MCG (1000 UNIT) PO TABS
1000.0000 [IU] | ORAL_TABLET | Freq: Every day | ORAL | Status: DC
  Administered 2020-09-24 – 2020-09-25 (×2): 1000 [IU] via ORAL
  Filled 2020-09-22 (×3): qty 1

## 2020-09-22 MED ORDER — ACETAMINOPHEN 325 MG PO TABS: 650.0000 mg | ORAL_TABLET | Freq: Four times a day (QID) | ORAL | Status: DC | PRN

## 2020-09-22 MED ORDER — MORPHINE SULFATE (PF) 4 MG/ML IV SOLN
4.0000 mg | Freq: Once | INTRAVENOUS | Status: DC
Start: 2020-09-22 — End: 2020-09-25

## 2020-09-22 MED ORDER — MAGNESIUM OXIDE -MG SUPPLEMENT 400 (240 MG) MG PO TABS
400.0000 mg | ORAL_TABLET | Freq: Every day | ORAL | Status: DC
  Administered 2020-09-24 – 2020-09-25 (×2): 400 mg via ORAL
  Filled 2020-09-22 (×3): qty 1

## 2020-09-22 MED ORDER — MELATONIN 5 MG PO TABS
10.0000 mg | ORAL_TABLET | Freq: Every day | ORAL | Status: DC
  Administered 2020-09-22 – 2020-09-24 (×3): 10 mg via ORAL
  Filled 2020-09-22 (×4): qty 2

## 2020-09-22 NOTE — ED Provider Notes (Signed)
[facility] EMERGENCY DEPARTMENT Provider Note   CSN: [id number] Arrival date & time: 09/22/20  [id number]     History No chief complaint on file.   Kenneth Daniel is a 75 y.o. male.  HPI       Kenneth Daniel is a 75 y.o. male, with a history of GERD, HTN, presenting to the ED with flank and abdominal pain beginning Tuesday, May 17. He describes the pain as a soreness, waxing and waning, radiating from the left flank into the left abdomen, currently 8/10.  Accompanied by nausea and dysuria over the last couple days.  He states he was seen in the ED yesterday, but his pain has significantly worsened.  Denies fever, chest pain, shortness of breath, back pain, neurologic deficits, vomiting, diarrhea, hematochezia/melena, hematuria, or any other complaints.   Past Medical History:  Diagnosis Date  . Acid reflux   . Aortic regurgitation   . Borderline anemia   . 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 Hearing loss    wears hearing aids  . HLD (hyperlipidemia)   . Hypertension   . Prostate cancer ([place])   . Wears glasses     Patient Active Problem List   Diagnosis Date Noted  . Acute pyelonephritis 09/22/2020  . Pyelonephritis 09/22/2020  . Myelopathy ([place]) 11/09/2019    Past Surgical History:  Procedure Laterality Date  . ANTERIOR CERVICAL DECOMP/DISCECTOMY FUSION N/A 11/09/2019   Procedure: ANTERIOR CERVICAL DECOMPRESSION FUSION CERVICAL 3-4 WITH INSTRUMENTATION AND ALLOGRAFT;  Surgeon: Phylliss Bob, MD;  Location: [facility];  Service: Orthopedics;  Laterality: N/A;  . CARDIAC CATHETERIZATION    . COLONOSCOPY  2020  . cyst removed     back- benign  . PROSTATECTOMY  2004  . UPPER GI ENDOSCOPY  2021       Family History  Problem Relation Age of Onset  . Pancreatic cancer Mother   . Prostate cancer Father     Social History   Tobacco Use  . Smoking status: Former Smoker    Types: Cigarettes    Quit date: 1974    Years  since quitting: 48.4  . Smokeless tobacco: Never Used  Vaping Use  . Vaping Use: Never used  Substance Use Topics  . Alcohol use: Never  . Drug use: Never    Home Medications Prior to Admission medications   Medication Sig Start Date End Date Taking? Authorizing Provider  acetaminophen (TYLENOL) 500 MG tablet Take 1,000 mg by mouth every 8 (eight) hours as needed for moderate pain.   Yes [provider]  aluminum hydroxide-magnesium carbonate (GAVISCON) 95-358 MG/15ML SUSP Take 30 mLs by mouth as needed for indigestion or heartburn.   Yes [provider]  amLODipine (NORVASC) 10 MG tablet Take 10 mg by mouth at bedtime.   Yes [provider]  ascorbic acid (VITAMIN C) 500 MG tablet Take 500 mg by mouth daily.   Yes [provider]  aspirin 81 MG EC tablet Take 81 mg by mouth daily.   Yes [provider]  atorvastatin (LIPITOR) 40 MG tablet Take 40 mg by mouth at bedtime.   Yes [provider]  azelastine (ASTELIN) 0.1 % nasal spray PLACE 2 SPRAYS INTO BOTH NOSTRILS 2 (TWO) TIMES DAILY 08/19/20  Yes Valentina Shaggy, MD  b complex vitamins tablet Take 1 tablet by mouth daily.   Yes [provider]  cholecalciferol (VITAMIN D3) 25 MCG (1000 UNIT) tablet Take 1,000 Units by mouth daily.  Yes [provider]  ECHINACEA EXTRACT PO Take 1 capsule by mouth daily as needed (cold symptoms).   Yes [provider]  Magnesium 250 MG TABS Take 250 mg by mouth daily.   Yes [provider]  Melatonin 5 MG CAPS Take 10 mg by mouth at bedtime.   Yes [provider]  nebivolol (BYSTOLIC) 10 MG tablet TAKE 1 TABLET (10 MG TOTAL) BY MOUTH IN THE MORNING. Patient taking differently: Take 10 mg by mouth daily. 08/19/20 11/17/20 Yes Tolia, Sunit, DO  Nutritional Supplements (JUICE PLUS FIBRE PO) Take 2 capsules by mouth daily.   Yes [provider]  Probiotic Product (PROBIOTIC DAILY PO) Take 1 capsule  by mouth daily.   Yes [provider]  valsartan-hydrochlorothiazide (DIOVAN-HCT) 320-12.5 MG tablet Take 1 tablet by mouth daily. 02/21/20  Yes [provider]  zinc gluconate 50 MG tablet Take 50 mg by mouth daily as needed (supplement).   Yes [provider]  cephALEXin (KEFLEX) 500 MG capsule Take 1 capsule (500 mg total) by mouth 3 (three) times daily for 14 days. 09/21/20 10/05/20  Gareth Morgan, MD    Allergies    Patient has no known allergies.  Review of Systems   Review of Systems  Constitutional: Negative for chills, diaphoresis and fever.  Respiratory: Negative for shortness of breath.   Cardiovascular: Negative for chest pain.  Gastrointestinal: Positive for abdominal pain and nausea. Negative for blood in stool, diarrhea and vomiting.  Genitourinary: Positive for dysuria and flank pain. Negative for hematuria, scrotal swelling and testicular pain.  Musculoskeletal: Negative for back pain.  Neurological: Negative for syncope, weakness and numbness.  All other systems reviewed and are negative.   Physical Exam Updated Vital Signs BP (!) 142/88 (BP Location: Right Arm)   Pulse (!) 103   Temp 98.9 F (37.2 C) (Oral)   Resp 18   Ht 6\' 2"  (1.88 m)   Wt 83.5 kg   SpO2 97%   BMI 23.62 kg/m   Physical Exam Vitals and nursing note reviewed.  Constitutional:      General: He is not in acute distress.    Appearance: He is well-developed. He is not diaphoretic.  HENT:     Head: Normocephalic and atraumatic.     Mouth/Throat:     Mouth: Mucous membranes are moist.     Pharynx: Oropharynx is clear.  Eyes:     Conjunctiva/sclera: Conjunctivae normal.  Cardiovascular:     Rate and Rhythm: Normal rate and regular rhythm.     Pulses: Normal pulses.          Radial pulses are 2+ on the right side and 2+ on the left side.       Dorsalis pedis pulses are 2+ on the right side and 2+ on the left side.     Heart sounds: Normal heart sounds.      Comments: Tactile temperature in the extremities appropriate and equal bilaterally. Pulmonary:     Effort: Pulmonary effort is normal. No respiratory distress.     Breath sounds: Normal breath sounds.  Abdominal:     Palpations: Abdomen is soft.     Tenderness: There is abdominal tenderness. There is no guarding.    Musculoskeletal:     Cervical back: Neck supple.     Right lower leg: 1+ Pitting Edema present.     Left lower leg: 1+ Pitting Edema present.  Lymphadenopathy:     Cervical: No cervical adenopathy.  Skin:  General: Skin is warm and dry.  Neurological:     Mental Status: He is alert.  Psychiatric:        Mood and Affect: Mood and affect normal.        Speech: Speech normal.        Behavior: Behavior normal.     ED Results / Procedures / Treatments   Labs (all labs ordered are listed, but only abnormal results are displayed) Labs Reviewed  COMPREHENSIVE METABOLIC PANEL - Abnormal; Notable for the following components:      Result Value   Sodium 134 (*)    Glucose, Bld 137 (*)    Albumin 3.2 (*)    All other components within normal limits  CBC WITH DIFFERENTIAL/PLATELET - Abnormal; Notable for the following components:   WBC 15.9 (*)    Hemoglobin 12.3 (*)    HCT 37.9 (*)    Neutro Abs 13.3 (*)    Lymphs Abs 0.4 (*)    Monocytes Absolute 2.1 (*)    Abs Immature Granulocytes 0.09 (*)    All other components within normal limits  URINALYSIS, ROUTINE W REFLEX MICROSCOPIC - Abnormal; Notable for the following components:   APPearance HAZY (*)    Hgb urine dipstick MODERATE (*)    Ketones, ur 5 (*)    Protein, ur 100 (*)    Leukocytes,Ua SMALL (*)    WBC, UA >50 (*)    Bacteria, UA FEW (*)    Non Squamous Epithelial 0-5 (*)    All other components within normal limits  URINE CULTURE  RESP PANEL BY RT-PCR (FLU A&B, COVID) ARPGX2  LIPASE, BLOOD  LACTIC ACID, PLASMA  LACTIC ACID, PLASMA    EKG None  Radiology DG Chest Portable 1 View  Result  Date: 09/22/2020 CLINICAL DATA:  Upper abdominal pain. EXAM: PORTABLE CHEST 1 VIEW COMPARISON:  None. FINDINGS: Linear bibasilar atelectasis. No confluent consolidation. Normal heart size and mediastinal contours. No pulmonary edema, pleural effusion, or pneumothorax. No acute osseous abnormalities are seen. IMPRESSION: Linear bibasilar atelectasis. Electronically Signed   By: Keith Rake M.D.   On: 09/22/2020 16:27   CT Renal Stone Study  Result Date: 09/22/2020 CLINICAL DATA:  Left flank and left lower quadrant pain. Clinical suspicion for a kidney stone. EXAM: CT ABDOMEN AND PELVIS WITHOUT CONTRAST TECHNIQUE: Multidetector CT imaging of the abdomen and pelvis was performed following the standard protocol without IV contrast. COMPARISON:  01/04/2020. FINDINGS: Lower chest: Mild bronchiectasis. Linear areas of atelectasis noted at both bases, new from the prior exam. Hepatobiliary: Liver normal in overall size. Multiple cysts, largest arising from the left lobe, 9.0 x 7.3 cm. Cysts stable from the prior CT. Gallbladder minimally distended. No convincing stones. No wall thickening or adjacent inflammation. No bile duct dilation. Pancreas: Unremarkable. No pancreatic ductal dilatation or surrounding inflammatory changes. Spleen: Normal in size without focal abnormality. Adrenals/Urinary Tract: No adrenal masses. Heterogeneous mass arises from the anterior midpole of the left kidney, measuring 6.9 x 6.8 cm transversely by 7.6 cm superior to inferior. This measured 5.1 x 4.8 cm transversely on the prior exam and was homogeneously low in attenuation. Current average Hounsfield units are 25, previously 15. There is adjacent perinephric stranding and extends to the anterior pararenal fascia. There are additional low-attenuation renal masses that are stable consistent with cysts. No renal stones. No hydronephrosis. Ureters normal in course and in caliber. Bladder is unremarkable. Stomach/Bowel: Normal stomach.  Small bowel is normal in caliber. A loop of small  bowel extends into a right inguinal hernia without evidence of obstruction or incarceration. There is a small amount of associated fluid. No small bowel wall thickening or adjacent inflammation. Colon is normal in caliber. There is mild to moderate increase in the colonic stool burden. No colonic wall thickening or inflammation. No evidence of appendicitis. Vascular/Lymphatic: Mild aortic atherosclerosis. No aneurysm. No enlarged lymph nodes. Reproductive: Status post prostatectomy. Other: No other abdominal wall hernias.  No ascites. Musculoskeletal: No fracture or acute finding. No osteoblastic or osteolytic lesions. IMPRESSION: 1. Left renal mass, heterogeneous in attenuation and associated with adjacent inflammation. This has increased in size from the prior CT with the heterogeneity and inflammation being new. Suspect this is a hemorrhagic renal cyst. However, a solid renal mass is not excluded based on this unenhanced study. This could be further assessed/characterized with renal ultrasound, but would be best characterized with renal MRI without and with contrast. 2. No other evidence of an acute abnormality. No renal or ureteral stones or obstructive uropathy. 3. Aortic atherosclerosis. 4. Mild to moderate increase in the colonic stool burden which is similar to the prior CT. Electronically Signed   By: Lajean Manes M.D.   On: 09/22/2020 13:02    Procedures Procedures   Medications Ordered in ED Medications  morphine 4 MG/ML injection 4 mg (has no administration in time range)  ondansetron (ZOFRAN) injection 4 mg (has no administration in time range)  cefTRIAXone (ROCEPHIN) 2 g in sodium chloride 0.9 % 100 mL IVPB (has no administration in time range)  acetaminophen (TYLENOL) tablet 1,000 mg (has no administration in time range)  aspirin EC tablet 81 mg (has no administration in time range)  amLODipine (NORVASC) tablet 10 mg (has no  administration in time range)  atorvastatin (LIPITOR) tablet 40 mg (has no administration in time range)  nebivolol (BYSTOLIC) tablet 10 mg (has no administration in time range)  hydrALAZINE (APRESOLINE) tablet 25 mg (has no administration in time range)  aluminum hydroxide-magnesium carbonate (GAVISCON) 95-358 MG/15ML suspension 30 mL (has no administration in time range)  Probiotic Daily CAPS (has no administration in time range)  Melatonin CAPS 10 mg (has no administration in time range)  ascorbic acid (VITAMIN C) tablet 500 mg (has no administration in time range)  b complex vitamins tablet 1 tablet (has no administration in time range)  cholecalciferol (VITAMIN D3) tablet 1,000 Units (has no administration in time range)  Magnesium TABS 250 mg (has no administration in time range)  azelastine (ASTELIN) 0.1 % nasal spray 2 spray (has no administration in time range)  acetaminophen (TYLENOL) tablet 650 mg (has no administration in time range)    Or  acetaminophen (TYLENOL) suppository 650 mg (has no administration in time range)  HYDROmorphone (DILAUDID) injection 0.5-1 mg (has no administration in time range)  ondansetron (ZOFRAN) tablet 4 mg (has no administration in time range)    Or  ondansetron (ZOFRAN) injection 4 mg (has no administration in time range)  0.9 %  sodium chloride infusion (has no administration in time range)  ondansetron (ZOFRAN) injection 4 mg (4 mg Intravenous Given 09/22/20 1311)  lactated ringers bolus 250 mL (0 mLs Intravenous Stopped 09/22/20 1445)  morphine 4 MG/ML injection 4 mg (4 mg Intravenous Given 09/22/20 1312)  lactated ringers bolus 250 mL (250 mLs Intravenous New Bag/Given 09/22/20 1606)    ED Course  I have reviewed the triage vital signs and the nursing notes.  Pertinent labs & imaging results that were available during my care  of the patient were reviewed by me and considered in my medical decision making (see chart for details).  Clinical  Course as of 09/22/20 1649  Sun Sep 22, 2020  1312 Spoke with Dr. Kris Hartmann, radiologist.  Dr. Autumn Patty who originally read the CT renal stone study was unavailable. We discussed the stone study and the mass in particular.  He states although the left renal mass mentioned could be an abscess if it was an abscess of the size he would expect much sicker patient. Statistically and consideration of common things with this appearance, suspicion leans towards the perinephric stranding from pyelonephritis with incidentally discovered renal masses that are unrelated to the patient's symptoms. However, this is a patient he thinks could be further assessed with additional imaging and his preference would be CT abdomen/pelvis with contrast. [SJ]  1540 Spoke with Dr. Roosevelt Locks, hospitalist.  Agrees to admit the patient.  We discussed further imaging for the patient.  He states he will come see the patient and likely discuss the case with urology versus ID, but if there is additional imaging needed he will order it.  He will also handle ordering any further antibiotics as they are needed. [SJ]    Clinical Course User Index [SJ] Godwin Tedesco, Helane Gunther, PA-C   MDM Rules/Calculators/A&P                          Patient presents with worsening left flank pain. Though ill-appearing, he is nontoxic-appearing, not hypotensive, not tachypneic, maintains adequate SPO2 on room air.  His temperature is elevated and he is tachycardic. I reviewed the patient's chart for more information. He was seen in the ED yesterday and diagnosed with pyelonephritis before discharge. Patient's last dose of Rocephin was given at 2146 last night. I personally reviewed and interpreted the patient's labs and imaging studies. Perinephric stranding and UA suggests pyelonephritis. Patient admitted for further management.  Findings and plan of care discussed with attending physician, Myrtie Cruise, MD.   This patient was evaluated during a time of global  shortage of iodinated contrast media. Based on guidance from the [employer] of Radiology, best practices, and local institutional approaches an alternative path for evaluating and managing the patient may have been employed in order to provide optimal care during this shortage. The current situation has been discussed with the patient.  Final Clinical Impression(s) / ED Diagnoses Final diagnoses:  Pyelonephritis    Rx / DC Orders ED Discharge Orders    None       Layla Maw 09/22/20 1649    Wyvonnia Dusky, MD 09/22/20 [phone]

## 2020-09-22 NOTE — ED Notes (Signed)
Patient transported to CT 

## 2020-09-22 NOTE — ED Notes (Signed)
Critical Lab on Lactic Acid 2.2 from [facility].

## 2020-09-22 NOTE — Plan of Care (Signed)
The patient is admitted from ER to 5 MW 11. A & O x 4. Denied any acute pain. No respiratory distress noted. Full assessment to epic completed. Will continue to monitor.

## 2020-09-22 NOTE — ED Triage Notes (Signed)
Patient complains of ongoing side pain after being seen yesterday for same and did not pick up antibiotics. Patient states that he is not improved.

## 2020-09-22 NOTE — H&P (Signed)
History and Physical    Kenneth Daniel [id number] DOB: Jan 08, 1946 DOA: 09/22/2020  PCP: Lucianne Lei, MD (Confirm with patient/family/NH records and if not entered, this has to be entered at [facility] point of entry) Patient coming from: Home  I have personally briefly reviewed patient's old medical records in [place]  Chief Complaint: Flank pain, Nausea and fever.  HPI: Kenneth Daniel is a 75 y.o. male with medical history significant of prostate disease status post prostatectomy, kidney cyst, on hormone manipulation acute 7-month, HTN, HLD, presented with worsening of left abdominal pain, nauseous and fever at home.  Patient started to have left flank pain which he described as " soreness" and burning sensation when urinating 1 week ago, 2 days ago, patient started to feel very weak, and patient came to ED last night, was found to [place] afebrile T-max 102.  UA compatible with UTI, but no leukocytosis, patient was sent home after receiving 2 g IV Rocephin, 1 L IV fluid and some Tylenol.  This morning, however patient woke up with worsening of flank pain and weakness, still running low-grade fever, continue to feel nauseous but no vomiting and not able to eat or drink since last night.  And unable to take p.o. antibiotics, and decided to come back to ED. ED Course: Leukocytosis WBC 15.9, creatinine 1.0, CT abdomen without contrast showed left kidney mass, hemorrhagic cyst versus abscess versus medical emergency.  Review of Systems: As per HPI otherwise 14 point review of systems negative.    Past Medical History:  Diagnosis Date  . Acid reflux   . Aortic regurgitation   . Borderline anemia   . 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 Hearing loss    wears hearing aids  . HLD (hyperlipidemia)   . Hypertension   . Prostate cancer ([place])   . Wears glasses     Past Surgical History:  Procedure Laterality Date  . ANTERIOR CERVICAL DECOMP/DISCECTOMY FUSION  N/A 11/09/2019   Procedure: ANTERIOR CERVICAL DECOMPRESSION FUSION CERVICAL 3-4 WITH INSTRUMENTATION AND ALLOGRAFT;  Surgeon: Phylliss Bob, MD;  Location: [facility];  Service: Orthopedics;  Laterality: N/A;  . CARDIAC CATHETERIZATION    . COLONOSCOPY  2020  . cyst removed     back- benign  . PROSTATECTOMY  2004  . UPPER GI ENDOSCOPY  2021     reports that he quit smoking about 48 years ago. His smoking use included cigarettes. He has never used smokeless tobacco. He reports that he does not drink alcohol and does not use drugs.  No Known Allergies  Family History  Problem Relation Age of Onset  . Pancreatic cancer Mother   . Prostate cancer Father      Prior to Admission medications   Medication Sig Start Date End Date Taking? Authorizing Provider  acetaminophen (TYLENOL) 500 MG tablet Take 1,000 mg by mouth every 8 (eight) hours as needed for moderate pain.   Yes [provider]  aluminum hydroxide-magnesium carbonate (GAVISCON) 95-358 MG/15ML SUSP Take 30 mLs by mouth as needed for indigestion or heartburn.   Yes [provider]  amLODipine (NORVASC) 10 MG tablet Take 10 mg by mouth at bedtime.   Yes [provider]  ascorbic acid (VITAMIN C) 500 MG tablet Take 500 mg by mouth daily.   Yes [provider]  aspirin 81 MG EC tablet Take 81 mg by mouth daily.   Yes [provider]  atorvastatin (LIPITOR) 40 MG tablet Take 40 mg by  mouth at bedtime.   Yes [provider]  azelastine (ASTELIN) 0.1 % nasal spray PLACE 2 SPRAYS INTO BOTH NOSTRILS 2 (TWO) TIMES DAILY 08/19/20  Yes Valentina Shaggy, MD  b complex vitamins tablet Take 1 tablet by mouth daily.   Yes [provider]  cholecalciferol (VITAMIN D3) 25 MCG (1000 UNIT) tablet Take 1,000 Units by mouth daily.   Yes [provider]  ECHINACEA EXTRACT PO Take 1 capsule by mouth daily as needed (cold symptoms).   Yes [provider]  Magnesium 250 MG TABS  Take 250 mg by mouth daily.   Yes [provider]  Melatonin 5 MG CAPS Take 10 mg by mouth at bedtime.   Yes [provider]  nebivolol (BYSTOLIC) 10 MG tablet TAKE 1 TABLET (10 MG TOTAL) BY MOUTH IN THE MORNING. Patient taking differently: Take 10 mg by mouth daily. 08/19/20 11/17/20 Yes Tolia, Sunit, DO  Nutritional Supplements (JUICE PLUS FIBRE PO) Take 2 capsules by mouth daily.   Yes [provider]  Probiotic Product (PROBIOTIC DAILY PO) Take 1 capsule by mouth daily.   Yes [provider]  valsartan-hydrochlorothiazide (DIOVAN-HCT) 320-12.5 MG tablet Take 1 tablet by mouth daily. 02/21/20  Yes [provider]  zinc gluconate 50 MG tablet Take 50 mg by mouth daily as needed (supplement).   Yes [provider]  cephALEXin (KEFLEX) 500 MG capsule Take 1 capsule (500 mg total) by mouth 3 (three) times daily for 14 days. 09/21/20 10/05/20  Gareth Morgan, MD    Physical Exam: Vitals:   09/22/20 1430 09/22/20 1445 09/22/20 1500 09/22/20 1530  BP: (!) 150/94 (!) 147/91 (!) 147/93 (!) 155/95  Pulse: (!) 101 98 (!) 114 (!) 105  Resp: 16 16 18  (!) 23  Temp:    99.4 F (37.4 C)  TempSrc:    Oral  SpO2: 95% 94% 94% 96%  Weight:      Height:        Constitutional: NAD, calm, comfortable Vitals:   09/22/20 1430 09/22/20 1445 09/22/20 1500 09/22/20 1530  BP: (!) 150/94 (!) 147/91 (!) 147/93 (!) 155/95  Pulse: (!) 101 98 (!) 114 (!) 105  Resp: 16 16 18  (!) 23  Temp:    99.4 F (37.4 C)  TempSrc:    Oral  SpO2: 95% 94% 94% 96%  Weight:      Height:       Eyes: PERRL, lids and conjunctivae normal ENMT: Mucous membranes are moist. Posterior pharynx clear of any exudate or lesions.Normal dentition.  Neck: normal, supple, no masses, no thyromegaly Respiratory: clear to auscultation bilaterally, no wheezing, no crackles. Normal respiratory effort. No accessory muscle use.  Cardiovascular: Regular rate and rhythm, no murmurs / rubs /  gallops. No extremity edema. 2+ pedal pulses. No carotid bruits.  Abdomen: left flank tenderness, no masses palpated. No hepatosplenomegaly. Bowel sounds positive.  Musculoskeletal: no clubbing / cyanosis. No joint deformity upper and lower extremities. Good ROM, no contractures. Normal muscle tone.  Skin: no rashes, lesions, ulcers. No induration Neurologic: CN 2-12 grossly intact. Sensation intact, DTR normal. Strength 5/5 in all 4.  Psychiatric: Normal judgment and insight. Alert and oriented x 3. Normal mood.     Labs on Admission: I have personally reviewed following labs and imaging studies  CBC: Recent Labs  Lab 09/21/20 1819 09/22/20 1337  WBC 9.0 15.9*  NEUTROABS  --  13.3*  HGB 11.7* 12.3*  HCT 36.5* 37.9*  MCV 87.3 88.1  PLT  170 323   Basic Metabolic Panel: Recent Labs  Lab 09/21/20 1819 09/22/20 1337  NA 134* 134*  K 3.7 3.7  CL 103 99  CO2 25 26  GLUCOSE 126* 137*  BUN 15 14  CREATININE 1.05 1.02  CALCIUM 9.1 8.9   GFR: Estimated Creatinine Clearance: 73.9 mL/min (by C-G formula based on SCr of 1.02 mg/dL). Liver Function Tests: Recent Labs  Lab 09/21/20 1819 09/22/20 1337  AST 17 19  ALT 21 21  ALKPHOS 74 76  BILITOT 0.6 1.2  PROT 7.0 7.5  ALBUMIN 3.5 3.2*   Recent Labs  Lab 09/21/20 1819 09/22/20 1337  LIPASE 35 32   No results for input(s): AMMONIA in the last 168 hours. Coagulation Profile: No results for input(s): INR, PROTIME in the last 168 hours. Cardiac Enzymes: No results for input(s): CKTOTAL, CKMB, CKMBINDEX, TROPONINI in the last 168 hours. BNP (last 3 results) No results for input(s): PROBNP in the last 8760 hours. HbA1C: No results for input(s): HGBA1C in the last 72 hours. CBG: No results for input(s): GLUCAP in the last 168 hours. Lipid Profile: No results for input(s): CHOL, HDL, LDLCALC, TRIG, CHOLHDL, LDLDIRECT in the last 72 hours. Thyroid Function Tests: No results for input(s): TSH, T4TOTAL, FREET4, T3FREE,  THYROIDAB in the last 72 hours. Anemia Panel: No results for input(s): VITAMINB12, FOLATE, FERRITIN, TIBC, IRON, RETICCTPCT in the last 72 hours. Urine analysis:    Component Value Date/Time   COLORURINE YELLOW 09/22/2020 1438   APPEARANCEUR HAZY (A) 09/22/2020 1438   LABSPEC 1.020 09/22/2020 1438   PHURINE 5.0 09/22/2020 1438   GLUCOSEU NEGATIVE 09/22/2020 1438   HGBUR MODERATE (A) 09/22/2020 1438   BILIRUBINUR NEGATIVE 09/22/2020 1438   KETONESUR 5 (A) 09/22/2020 1438   PROTEINUR 100 (A) 09/22/2020 1438   NITRITE NEGATIVE 09/22/2020 1438   LEUKOCYTESUR SMALL (A) 09/22/2020 1438    Radiological Exams on Admission: CT Renal Stone Study  Result Date: 09/22/2020 CLINICAL DATA:  Left flank and left lower quadrant pain. Clinical suspicion for a kidney stone. EXAM: CT ABDOMEN AND PELVIS WITHOUT CONTRAST TECHNIQUE: Multidetector CT imaging of the abdomen and pelvis was performed following the standard protocol without IV contrast. COMPARISON:  01/04/2020. FINDINGS: Lower chest: Mild bronchiectasis. Linear areas of atelectasis noted at both bases, new from the prior exam. Hepatobiliary: Liver normal in overall size. Multiple cysts, largest arising from the left lobe, 9.0 x 7.3 cm. Cysts stable from the prior CT. Gallbladder minimally distended. No convincing stones. No wall thickening or adjacent inflammation. No bile duct dilation. Pancreas: Unremarkable. No pancreatic ductal dilatation or surrounding inflammatory changes. Spleen: Normal in size without focal abnormality. Adrenals/Urinary Tract: No adrenal masses. Heterogeneous mass arises from the anterior midpole of the left kidney, measuring 6.9 x 6.8 cm transversely by 7.6 cm superior to inferior. This measured 5.1 x 4.8 cm transversely on the prior exam and was homogeneously low in attenuation. Current average Hounsfield units are 25, previously 15. There is adjacent perinephric stranding and extends to the anterior pararenal fascia. There are  additional low-attenuation renal masses that are stable consistent with cysts. No renal stones. No hydronephrosis. Ureters normal in course and in caliber. Bladder is unremarkable. Stomach/Bowel: Normal stomach. Small bowel is normal in caliber. A loop of small bowel extends into a right inguinal hernia without evidence of obstruction or incarceration. There is a small amount of associated fluid. No small bowel wall thickening or adjacent inflammation. Colon is normal in caliber. There is mild to moderate increase  in the colonic stool burden. No colonic wall thickening or inflammation. No evidence of appendicitis. Vascular/Lymphatic: Mild aortic atherosclerosis. No aneurysm. No enlarged lymph nodes. Reproductive: Status post prostatectomy. Other: No other abdominal wall hernias.  No ascites. Musculoskeletal: No fracture or acute finding. No osteoblastic or osteolytic lesions. IMPRESSION: 1. Left renal mass, heterogeneous in attenuation and associated with adjacent inflammation. This has increased in size from the prior CT with the heterogeneity and inflammation being new. Suspect this is a hemorrhagic renal cyst. However, a solid renal mass is not excluded based on this unenhanced study. This could be further assessed/characterized with renal ultrasound, but would be best characterized with renal MRI without and with contrast. 2. No other evidence of an acute abnormality. No renal or ureteral stones or obstructive uropathy. 3. Aortic atherosclerosis. 4. Mild to moderate increase in the colonic stool burden which is similar to the prior CT. Electronically Signed   By: Lajean Manes M.D.   On: 09/22/2020 13:02    EKG: Independently reviewed. Sinus, LVH, no ST-T changes  Assessment/Plan Active Problems:   Acute pyelonephritis   Pyelonephritis  (please populate well all problems here in Problem List. (For example, if patient is on BP meds at home and you resume or decide to hold them, it is a problem that needs  to be her. Same for CAD, COPD, HLD and so on)  Acute pyelonephritis -With worsening of the symptoms as well as increased white count, concerning for renal abscess.  Discussed with on-call urologist Dr. Milford Cage, recommend repeat CT, with renal W and W/O contrast protocol. If the result of fevers abscess will consult IR for drainage.  Urology will follow up again tomorrow morning. -Continue IV ceftriaxone 2 g daily -IV hydration. -Send lactic acid level. -Check PVR -No chemical DVT prophylaxis until bleeding cyst ruled out but CT.  HTN -Continue amlodipine and beta-blocker, hold HCTZ and ACEI for now.  As needed hydralazine.  History of prostate cancer -Continue every 6 months hormone therapy  HLD -Statin  DVT prophylaxis: SCD Code Status: Full Code Family Communication: Wife at bedside Disposition Plan: Expect less than 2 midnight hospital stay if renal CT negative. Consults called: Urology Admission status: Tele admit   Lequita Halt MD [facility] Pager [phone]  09/22/2020, 4:07 PM

## 2020-09-23 ENCOUNTER — Inpatient Hospital Stay (HOSPITAL_COMMUNITY): Payer: Medicare PPO

## 2020-09-23 DIAGNOSIS — N10 Acute tubulo-interstitial nephritis: Secondary | ICD-10-CM | POA: Diagnosis present

## 2020-09-23 DIAGNOSIS — Z20822 Contact with and (suspected) exposure to covid-19: Secondary | ICD-10-CM | POA: Diagnosis present

## 2020-09-23 DIAGNOSIS — E785 Hyperlipidemia, unspecified: Secondary | ICD-10-CM | POA: Diagnosis present

## 2020-09-23 DIAGNOSIS — Z9079 Acquired absence of other genital organ(s): Secondary | ICD-10-CM | POA: Diagnosis not present

## 2020-09-23 DIAGNOSIS — R600 Localized edema: Secondary | ICD-10-CM | POA: Diagnosis not present

## 2020-09-23 DIAGNOSIS — A419 Sepsis, unspecified organism: Secondary | ICD-10-CM | POA: Diagnosis not present

## 2020-09-23 DIAGNOSIS — Z79899 Other long term (current) drug therapy: Secondary | ICD-10-CM | POA: Diagnosis not present

## 2020-09-23 DIAGNOSIS — H9190 Unspecified hearing loss, unspecified ear: Secondary | ICD-10-CM | POA: Diagnosis present

## 2020-09-23 DIAGNOSIS — N281 Cyst of kidney, acquired: Secondary | ICD-10-CM | POA: Diagnosis present

## 2020-09-23 DIAGNOSIS — I5032 Chronic diastolic (congestive) heart failure: Secondary | ICD-10-CM | POA: Diagnosis present

## 2020-09-23 DIAGNOSIS — I110 Hypertensive heart disease with heart failure: Secondary | ICD-10-CM | POA: Diagnosis present

## 2020-09-23 DIAGNOSIS — Z8546 Personal history of malignant neoplasm of prostate: Secondary | ICD-10-CM | POA: Diagnosis not present

## 2020-09-23 DIAGNOSIS — K219 Gastro-esophageal reflux disease without esophagitis: Secondary | ICD-10-CM | POA: Diagnosis present

## 2020-09-23 DIAGNOSIS — Z8042 Family history of malignant neoplasm of prostate: Secondary | ICD-10-CM | POA: Diagnosis not present

## 2020-09-23 DIAGNOSIS — N12 Tubulo-interstitial nephritis, not specified as acute or chronic: Secondary | ICD-10-CM | POA: Diagnosis present

## 2020-09-23 DIAGNOSIS — Z87891 Personal history of nicotine dependence: Secondary | ICD-10-CM | POA: Diagnosis not present

## 2020-09-23 DIAGNOSIS — Z981 Arthrodesis status: Secondary | ICD-10-CM | POA: Diagnosis not present

## 2020-09-23 DIAGNOSIS — Z974 Presence of external hearing-aid: Secondary | ICD-10-CM | POA: Diagnosis not present

## 2020-09-23 DIAGNOSIS — Z7982 Long term (current) use of aspirin: Secondary | ICD-10-CM | POA: Diagnosis not present

## 2020-09-23 LAB — BASIC METABOLIC PANEL
Anion gap: 10 (ref 5–15)
BUN: 16 mg/dL (ref 8–23)
CO2: 26 mmol/L (ref 22–32)
Calcium: 8.8 mg/dL — ABNORMAL LOW (ref 8.9–10.3)
Chloride: 99 mmol/L (ref 98–111)
Creatinine, Ser: 1.03 mg/dL (ref 0.61–1.24)
GFR, Estimated: >60 mL/min (ref >60)
Glucose, Bld: 130 mg/dL — ABNORMAL HIGH (ref 70–99)
Potassium: 3.8 mmol/L (ref 3.5–5.1)
Sodium: 135 mmol/L (ref 135–145)

## 2020-09-23 LAB — URINE CULTURE: Culture: NO GROWTH

## 2020-09-23 LAB — CBC
HCT: 36.2 % — ABNORMAL LOW (ref 39.0–52.0)
Hemoglobin: 11.8 g/dL — ABNORMAL LOW (ref 13.0–17.0)
MCH: 28.2 pg (ref 26.0–34.0)
MCHC: 32.6 g/dL (ref 30.0–36.0)
MCV: 86.4 fL (ref 80.0–100.0)
Platelets: 165 10*3/uL (ref 150–400)
RBC: 4.19 MIL/uL — ABNORMAL LOW (ref 4.22–5.81)
RDW: 15.1 % (ref 11.5–15.5)
WBC: 15.8 10*3/uL — ABNORMAL HIGH (ref 4.0–10.5)
nRBC: 0 % (ref 0.0–0.2)

## 2020-09-23 MED ORDER — POLYETHYLENE GLYCOL 3350 17 G PO PACK: 17.0000 g | PACK | Freq: Every day | ORAL | Status: DC | PRN

## 2020-09-23 NOTE — Evaluation (Signed)
Physical Therapy Evaluation Patient Details Name: Kenneth Daniel MRN: [id number] DOB: [birth year] Today's Date: 09/23/2020   History of Present Illness  75 yo male admitted 09/22/20 c/o L flank pain. Found to have acute pyelonephritis. PMH HOH, HLD, HTN, prostate CA, ACDF  Clinical Impression   Patient received sitting at EOB, pleasant and cooperative but limited by pain/nausea today. Tolerated in room gait distances with no device, but grossly unsteady and often reached out for external support. Gait distance/activity limited by pain/nausea. Left up in recliner with all needs met, chair alarm active and nursing staff aware of patient status. Will benefit from [place] f/u at DC.     Follow Up Recommendations Home health PT    Equipment Recommendations  Rolling walker with 5" wheels;3in1 (PT)    Recommendations for Other Services       Precautions / Restrictions Precautions Precautions: Fall;Other (comment) Precaution Comments: nausea, stomach pain Restrictions Weight Bearing Restrictions: No      Mobility  Bed Mobility               General bed mobility comments: received sitting at EOB    Transfers Overall transfer level: Needs assistance Equipment used: None Transfers: Sit to/from Stand Sit to Stand: Min assist         General transfer comment: MinA to boost to upright position without AD and gain balance, wide BOS  Ambulation/Gait Ambulation/Gait assistance: Min guard Gait Distance (Feet): 12 Feet Assistive device: None Gait Pattern/deviations: Step-through pattern;Wide base of support;Decreased step length - right;Decreased step length - left;Trunk flexed;Antalgic Gait velocity: decreased   General Gait Details: slow and mildly unsteady, reaching out for end of bed/bed rail to steady himself, flexed posture and slow, antalgic gait due to pain  Stairs            Wheelchair Mobility    Modified [name] (Stroke Patients Only)       Balance Overall  balance assessment: Needs assistance Sitting-balance support: No upper extremity supported;Feet supported Sitting balance-Leahy Scale: Good     Standing balance support: During functional activity;No upper extremity supported Standing balance-Leahy Scale: Poor Standing balance comment: often reaching out for bed rails and external support                             Pertinent Vitals/Pain Pain Assessment: No/denies pain Pain Score: 9  Pain Location: stomach Pain Descriptors / Indicators: Discomfort;Sharp Pain Intervention(s): Limited activity within patient's tolerance;Monitored during session;Premedicated before session    Home Living Family/patient expects to be discharged to:: Private residence Living Arrangements: Alone Available Help at Discharge: Family;Available 24 hours/day Type of Home: Apartment (first floor) Home Access: Level entry     Home Layout: One level Home Equipment: Walker - 2 wheels;Cane - single point      Prior Function Level of Independence: Independent               Hand Dominance        Extremity/Trunk Assessment   Upper Extremity Assessment Upper Extremity Assessment: Generalized weakness    Lower Extremity Assessment Lower Extremity Assessment: Generalized weakness    Cervical / Trunk Assessment Cervical / Trunk Assessment: Kyphotic  Communication   Communication: HOH  Cognition Arousal/Alertness: Awake/alert Behavior During Therapy: Flat affect Overall Cognitive Status: Within Functional Limits for tasks assessed  General Comments: very pleasant and cooperative      General Comments      Exercises     Assessment/Plan    PT Assessment Patient needs continued PT services  PT Problem List Decreased strength;Decreased knowledge of use of DME;Decreased activity tolerance;Decreased safety awareness;Decreased balance;Decreased mobility;Decreased coordination;Pain        PT Treatment Interventions DME instruction;Balance training;Gait training;Stair training;Functional mobility training;Patient/family education;Therapeutic activities;Therapeutic exercise    PT Goals (Current goals can be found in the Care Plan section)  Acute Rehab PT Goals Patient Stated Goal: less pain PT Goal Formulation: With patient Time For Goal Achievement: 10/07/20 Potential to Achieve Goals: Good    Frequency Min 3X/week   Barriers to discharge        Co-evaluation               AM-PAC PT "6 Clicks" Mobility  Outcome Measure Help needed turning from your back to your side while in a flat bed without using bedrails?: A Little Help needed moving from lying on your back to sitting on the side of a flat bed without using bedrails?: A Little Help needed moving to and from a bed to a chair (including a wheelchair)?: A Little Help needed standing up from a chair using your arms (e.g., wheelchair or bedside chair)?: A Little Help needed to walk in hospital room?: A Little Help needed climbing 3-5 steps with a railing? : A Lot 6 Click Score: 17    End of Session Equipment Utilized During Treatment: Gait belt Activity Tolerance: Patient tolerated treatment well Patient left: in chair;with call bell/phone within reach;with chair alarm set Nurse Communication: Mobility status PT Visit Diagnosis: Unsteadiness on feet (R26.81);Muscle weakness (generalized) (M62.81);Pain;Difficulty in walking, not elsewhere classified (R26.2) Pain - Right/Left:  (midline) Pain - part of body:  (abdominal)    Time: 9295-7473 PT Time Calculation (min) (ACUTE ONLY): 24 min   Charges:   PT Evaluation $PT Eval Moderate Complexity: 1 Mod PT Treatments $Gait Training: 8-22 mins        Windell Norfolk, DPT, PN1   Supplemental Physical Therapist [place]    Pager [phone] Acute Rehab Office [phone]

## 2020-09-23 NOTE — TOC Initial Note (Signed)
Transition of Care [facility]) - Initial/Assessment Note    Patient Details  Name: Kenneth Daniel MRN: [id number] Date of Birth: 11/05/1945  Transition of [facility]) CM/SW Contact:    Ninfa Meeker, RN Phone Number: 09/23/2020, 1:14 PM  Clinical Narrative:  Case manager spoke with patient's sister-Betty Hoffman who is with patient at this time. Discussed options for [place]. Patient has recently relocated to [facility] and is not familiar with any agencies. CM given permission to arrange [facility] services. CM called Ramond Marrow with [facility],patient's insurance  is out of network with [facility], CM called referral to [place] with [facility], they will accept patient. Inez Catalina states that patient has rolling walker and 3in1, no DME needs. TOC Team will continue to monitor.                   Expected Discharge Plan: [place] Barriers to Discharge: No Barriers Identified   Patient Goals and CMS Choice   CMS Medicare.gov Compare Post Acute Care list provided to:: Patient Represenative (must comment) (sister: Clearance Coots) Choice offered to / list presented to : Sibling  Expected Discharge Plan and Services Expected Discharge Plan: [place] In-house Referral: NA Discharge Planning Services: CM Consult Post Acute Care Choice: [place] arrangements for the past 2 months: Apartment                 DME Arranged: N/A         HH Arranged: PT HH Agency: [place] Date [place]: 09/23/20 Time HH Agency Contacted: [id number]    Prior Living Arrangements/Services Living arrangements for the past 2 months: Apartment Lives with:: Self Patient language and need for interpreter reviewed:: Yes Do you feel safe going back to the place where you live?: Yes      Need for Family Participation in Patient Care: Yes (Comment) Care giver support system in place?: Yes (comment) Current home services: DME (has RW and 3in1) Criminal  Activity/Legal Involvement Pertinent to Current Situation/Hospitalization: No - Comment as needed  Activities of Daily Living Home Assistive Devices/Equipment: None ADL Screening (condition at time of admission) Patient's cognitive ability adequate to safely complete daily activities?: Yes Is the patient deaf or have difficulty hearing?: Yes Does the patient have difficulty seeing, even when wearing glasses/contacts?: No Does the patient have difficulty concentrating, remembering, or making decisions?: No Patient able to express need for assistance with ADLs?: Yes Does the patient have difficulty dressing or bathing?: No Independently performs ADLs?: Yes (appropriate for developmental age) Does the patient have difficulty walking or climbing stairs?: No Weakness of Legs: Both Weakness of Arms/Hands: None  Permission Sought/Granted Permission sought to share information with : Case Manager       Permission granted to share info w AGENCY: [facility]        Emotional Assessment       Orientation: : Oriented to Self,Oriented to Place,Oriented to  Time,Oriented to Situation Alcohol / Substance Use: Not Applicable Psych Involvement: No (comment)  Admission diagnosis:  Pyelonephritis [N12] Acute pyelonephritis [N10] Patient Active Problem List   Diagnosis Date Noted  . Acute pyelonephritis 09/22/2020  . Pyelonephritis 09/22/2020  . Myelopathy ([place]) 11/09/2019   PCP:  Lucianne Lei, MD Pharmacy:   [id number] - , [address] [place] [address] Phone: [phone] Fax: [phone]     Social Determinants of Health (SDOH) Interventions  Readmission Risk Interventions No flowsheet data found.

## 2020-09-23 NOTE — Plan of Care (Signed)
  Problem: Education: Goal: Knowledge of General Education information will improve Description: Including pain rating scale, medication(s)/side effects and non-pharmacologic comfort measures Outcome: Progressing   Problem: Activity: Goal: Risk for activity intolerance will decrease Outcome: Progressing   

## 2020-09-23 NOTE — Plan of Care (Signed)
  Problem: Education: Goal: Knowledge of General Education information will improve Description Including pain rating scale, medication(s)/side effects and non-pharmacologic comfort measures Outcome: Progressing   

## 2020-09-23 NOTE — Progress Notes (Signed)
[facility] PROGRESS NOTE    Progress Note  [name]  YNW:[id number] DOB: Jun 02, 1945 DOA: 09/22/2020 PCP: Lucianne Lei, MD     Brief Narrative:   Kenneth Daniel is an 75 y.o. male past medical history significant for prostate cancer status post prostatectomy, hormonal manipulation 6 months ago, essential hypertension comes in with left-sided abdominal pain nausea and fever that started about 2 days prior to admission.  He also relates dysuria.  In the ED was found to have a T-max of 102, white count of 16 CT scan of the abdomen and pelvis without contrast showed asymmetric left perinephric soft tissue stranding no evidence of hydronephrosis suspicious for Pyelo  Antibiotics: 09/22/2020 IV Rocephin Microbiology data: Blood culture 09/02/2020: Urine culture  Procedures: None  Assessment/Plan:    Acute pyelonephritis: CT scan of the abdomen perinephric stranding with a complex cyst. He was started on IV Rocephin urine cultures and blood cultures were sent. The case was discussed with urology as initial CT scan showed a renal mass but a repeated CT scan with contrast showed complex cyst with pyelonephritis.  Essential hypertension: Continue amlodipine and beta-blockers hold ACE inhibitor and diuretic therapy.  History of prostate cancer: Continue hormonal therapy to follow-up with urology as an outpatient.  Hyperlipidemia: Continue statins.   DVT prophylaxis: lovenoxn Family Communication:one Status is: Observation  The patient will require care spanning > 2 midnights and should be moved to inpatient because: Hemodynamically unstable  Dispo: The patient is from: Home              Anticipated d/c is to: Home              Patient currently is not medically stable to d/c.   Difficult to place patient No        Code Status:     Code Status Orders  (From admission, onward)         Start     Ordered   09/22/20 1605  Full code  Continuous         09/22/20 1605        Code Status History    Date Active Date Inactive Code Status Order ID Comments User Context   11/09/2019 1039 11/10/2019 1649 Full Code [id number]  Justice Britain, PA-C Inpatient   Advance Care Planning Activity    Advance Directive Documentation   Flowsheet Row Most Recent Value  Type of Advance Directive Living will  Pre-existing out of facility DNR order (yellow form or pink MOST form) --  "MOST" Form in Place? --        IV Access:    Peripheral IV   Procedures and diagnostic studies:   CT ABDOMEN W CONTRAST  Result Date: 09/22/2020 CLINICAL DATA:  Severe left flank pain. Left pyelonephritis. Indeterminate cystic renal lesions on recent CT. Evaluate for renal abscess or neoplasm. EXAM: CT ABDOMEN WITHOUT AND WITH CONTRAST TECHNIQUE: Multidetector CT imaging of the abdomen was performed following the standard protocol before and following the bolus administration of intravenous contrast. CONTRAST:  49mL OMNIPAQUE IOHEXOL 350 MG/ML SOLN COMPARISON:  Noncontrast CT 09/22/2020 and 01/04/2020 FINDINGS: Lower chest: Increased bibasilar subsegmental atelectasis. Hepatobiliary: No hepatic masses identified. Multiple benign hepatic cysts are again seen, largest in the left lobe measuring 8.5 cm. Gallbladder is unremarkable. No evidence of biliary ductal dilatation. Pancreas:  No mass or inflammatory changes. Spleen:  Within normal limits in size and appearance. Adrenals/Urinary Tract: Normal adrenal glands. A few benign right renal cysts are again  seen. Multiple left renal cysts are also seen, with dominant mildly complex cyst in the anterior midpole of the left kidney which shows thin peripheral wall enhancement and a few thin internal septations. No thickened septations or solid component identified. This has features of a usually benign Bosniak category 2 F cystic lesion. This measures 7.0 x 6.8 cm, compared to 6.2 x 4.9 cm on 01/04/2020 exam. No evidence hydronephrosis.  Asymmetric left perinephric soft tissue stranding is seen, however there is no evidence hydronephrosis. Stomach/Bowel: Visualized portion unremarkable. Vascular/Lymphatic: No pathologically enlarged lymph nodes identified. No abdominal aortic aneurysm. Aortic atherosclerotic calcification noted. Other:  None. Musculoskeletal:  No suspicious bone lesions identified. IMPRESSION: Asymmetric left perinephric soft tissue stranding. No evidence of hydronephrosis. This is suspicious for pyelonephritis. Recommend correlation with urinalysis. No evidence of solid renal mass or abscess. Indeterminate but usually benign Bosniak category 79F cystic lesion of left kidney. This shows no change in morphology, but is mildly increased in size since prior study on 01/04/2020. Recommend continued imaging follow-up in 6 months, preferably with abdomen MRI without and with contrast. This recommendation follows ACR consensus guidelines: Management of the Incidental Renal Mass on CT: A White Paper of the ACR Incidental Findings Committee. J Am Coll Radiol [phone]. Stable benign hepatic cysts. Aortic Atherosclerosis (ICD10-I70.0). Electronically Signed   By: Marlaine Hind M.D.   On: 09/22/2020 18:21   DG Chest Portable 1 View  Result Date: 09/22/2020 CLINICAL DATA:  Upper abdominal pain. EXAM: PORTABLE CHEST 1 VIEW COMPARISON:  None. FINDINGS: Linear bibasilar atelectasis. No confluent consolidation. Normal heart size and mediastinal contours. No pulmonary edema, pleural effusion, or pneumothorax. No acute osseous abnormalities are seen. IMPRESSION: Linear bibasilar atelectasis. Electronically Signed   By: Keith Rake M.D.   On: 09/22/2020 16:27   CT Renal Stone Study  Result Date: 09/22/2020 CLINICAL DATA:  Left flank and left lower quadrant pain. Clinical suspicion for a kidney stone. EXAM: CT ABDOMEN AND PELVIS WITHOUT CONTRAST TECHNIQUE: Multidetector CT imaging of the abdomen and pelvis was performed following the  standard protocol without IV contrast. COMPARISON:  01/04/2020. FINDINGS: Lower chest: Mild bronchiectasis. Linear areas of atelectasis noted at both bases, new from the prior exam. Hepatobiliary: Liver normal in overall size. Multiple cysts, largest arising from the left lobe, 9.0 x 7.3 cm. Cysts stable from the prior CT. Gallbladder minimally distended. No convincing stones. No wall thickening or adjacent inflammation. No bile duct dilation. Pancreas: Unremarkable. No pancreatic ductal dilatation or surrounding inflammatory changes. Spleen: Normal in size without focal abnormality. Adrenals/Urinary Tract: No adrenal masses. Heterogeneous mass arises from the anterior midpole of the left kidney, measuring 6.9 x 6.8 cm transversely by 7.6 cm superior to inferior. This measured 5.1 x 4.8 cm transversely on the prior exam and was homogeneously low in attenuation. Current average Hounsfield units are 25, previously 15. There is adjacent perinephric stranding and extends to the anterior pararenal fascia. There are additional low-attenuation renal masses that are stable consistent with cysts. No renal stones. No hydronephrosis. Ureters normal in course and in caliber. Bladder is unremarkable. Stomach/Bowel: Normal stomach. Small bowel is normal in caliber. A loop of small bowel extends into a right inguinal hernia without evidence of obstruction or incarceration. There is a small amount of associated fluid. No small bowel wall thickening or adjacent inflammation. Colon is normal in caliber. There is mild to moderate increase in the colonic stool burden. No colonic wall thickening or inflammation. No evidence of appendicitis. Vascular/Lymphatic: Mild aortic atherosclerosis. No  aneurysm. No enlarged lymph nodes. Reproductive: Status post prostatectomy. Other: No other abdominal wall hernias.  No ascites. Musculoskeletal: No fracture or acute finding. No osteoblastic or osteolytic lesions. IMPRESSION: 1. Left renal mass,  heterogeneous in attenuation and associated with adjacent inflammation. This has increased in size from the prior CT with the heterogeneity and inflammation being new. Suspect this is a hemorrhagic renal cyst. However, a solid renal mass is not excluded based on this unenhanced study. This could be further assessed/characterized with renal ultrasound, but would be best characterized with renal MRI without and with contrast. 2. No other evidence of an acute abnormality. No renal or ureteral stones or obstructive uropathy. 3. Aortic atherosclerosis. 4. Mild to moderate increase in the colonic stool burden which is similar to the prior CT. Electronically Signed   By: Lajean Manes M.D.   On: 09/22/2020 13:02     Medical Consultants:    None.   Subjective:    Jeet Shough relates he still nauseated not able to tolerate anything by mouth.  Objective:    Vitals:   09/22/20 2200 09/23/20 0201 09/23/20 0617 09/23/20 0618  BP: (!) 146/94 135/82 (!) 147/89   Pulse: 99 (!) 104 (!) 103   Resp: 18 16  16   Temp: 100.2 F (37.9 C) 99.6 F (37.6 C) 100.3 F (37.9 C)   TempSrc: Oral Oral Oral   SpO2: 94% 97% 92%   Weight:      Height:       SpO2: 92 %   Intake/Output Summary (Last 24 hours) at 09/23/2020 0831 Last data filed at 09/23/2020 0600 Gross per 24 hour  Intake 1400 ml  Output 451 ml  Net 949 ml   Filed Weights   09/22/20 1200  Weight: 83.5 kg    Exam: General exam: In no acute distress. Respiratory system: Good air movement and clear to auscultation. Cardiovascular system: S1 & S2 heard, RRR. No JVD, Gastrointestinal system: Abdomen is nondistended, soft and nontender, exquisite CVA tenderness. Extremities: No pedal edema. Skin: No rashes, lesions or ulcers Psychiatry: Judgement and insight appear normal. Mood & affect appropriate.    Data Reviewed:    Labs: Basic Metabolic Panel: Recent Labs  Lab 09/21/20 1819 09/22/20 1337  NA 134* 134*  K 3.7 3.7  CL 103  99  CO2 25 26  GLUCOSE 126* 137*  BUN 15 14  CREATININE 1.05 1.02  CALCIUM 9.1 8.9   GFR Estimated Creatinine Clearance: 73.9 mL/min (by C-G formula based on SCr of 1.02 mg/dL). Liver Function Tests: Recent Labs  Lab 09/21/20 1819 09/22/20 1337  AST 17 19  ALT 21 21  ALKPHOS 74 76  BILITOT 0.6 1.2  PROT 7.0 7.5  ALBUMIN 3.5 3.2*   Recent Labs  Lab 09/21/20 1819 09/22/20 1337  LIPASE 35 32   No results for input(s): AMMONIA in the last 168 hours. Coagulation profile No results for input(s): INR, PROTIME in the last 168 hours. COVID-19 Labs  No results for input(s): DDIMER, FERRITIN, LDH, CRP in the last 72 hours.  Lab Results  Component Value Date   SARSCOV2NAA NEGATIVE 09/22/2020   [place] NEGATIVE 11/07/2019    CBC: Recent Labs  Lab 09/21/20 1819 09/22/20 1337 09/23/20 0556  WBC 9.0 15.9* 15.8*  NEUTROABS  --  13.3*  --   HGB 11.7* 12.3* 11.8*  HCT 36.5* 37.9* 36.2*  MCV 87.3 88.1 86.4  PLT 170 166 165   Cardiac Enzymes: No results for input(s): CKTOTAL, CKMB, CKMBINDEX, TROPONINI  in the last 168 hours. BNP (last 3 results) No results for input(s): PROBNP in the last 8760 hours. CBG: No results for input(s): GLUCAP in the last 168 hours. D-Dimer: No results for input(s): DDIMER in the last 72 hours. Hgb A1c: No results for input(s): HGBA1C in the last 72 hours. Lipid Profile: No results for input(s): CHOL, HDL, LDLCALC, TRIG, CHOLHDL, LDLDIRECT in the last 72 hours. Thyroid function studies: No results for input(s): TSH, T4TOTAL, T3FREE, THYROIDAB in the last 72 hours.  Invalid input(s): FREET3 Anemia work up: No results for input(s): VITAMINB12, FOLATE, FERRITIN, TIBC, IRON, RETICCTPCT in the last 72 hours. Sepsis Labs: Recent Labs  Lab 09/21/20 1819 09/21/20 2135 09/22/20 1337 09/22/20 1630 09/22/20 1936 09/23/20 0556  WBC 9.0  --  15.9*  --   --  15.8*  LATICACIDVEN  --  1.0  --  2.2* 2.7*  --    Microbiology Recent Results  (from the past 240 hour(s))  Blood culture (routine x 2)     Status: None (Preliminary result)   Collection Time: 09/21/20  9:25 PM   Specimen: BLOOD  Result Value Ref Range Status   Specimen Description BLOOD SITE NOT SPECIFIED  Final   Special Requests   Final    BOTTLES DRAWN AEROBIC AND ANAEROBIC Blood Culture adequate volume   Culture   Final    NO GROWTH < 24 HOURS Performed at [facility], [place] [address] [place], [place] [address]    Report Status PENDING  Incomplete  Blood culture (routine x 2)     Status: None (Preliminary result)   Collection Time: 09/21/20  9:35 PM   Specimen: BLOOD  Result Value Ref Range Status   Specimen Description BLOOD SITE NOT SPECIFIED  Final   Special Requests   Final    BOTTLES DRAWN AEROBIC AND ANAEROBIC Blood Culture adequate volume   Culture   Final    NO GROWTH < 24 HOURS Performed at [facility], [place] [address] [place], [place] [address]    Report Status PENDING  Incomplete  Resp Panel by RT-PCR (Flu A&B, Covid) Nasopharyngeal Swab     Status: None   Collection Time: 09/22/20  4:30 PM   Specimen: Nasopharyngeal Swab; Nasopharyngeal(NP) swabs in vial transport medium  Result Value Ref Range Status   SARS Coronavirus 2 by RT PCR NEGATIVE NEGATIVE Final    Comment: (NOTE) SARS-CoV-2 target nucleic acids are NOT DETECTED.  The SARS-CoV-2 RNA is generally detectable in upper respiratory specimens during the acute phase of infection. The lowest concentration of SARS-CoV-2 viral copies this assay can detect is 138 copies/mL. A negative result does not preclude SARS-Cov-2 infection and should not be used as the sole basis for treatment or other patient management decisions. A negative result may occur with  improper specimen collection/handling, submission of specimen other than nasopharyngeal swab, presence of viral mutation(s) within the areas targeted by this assay, and inadequate number of viral copies(<138 copies/mL). A  negative result must be combined with clinical observations, patient history, and epidemiological information. The expected result is Negative.  Fact Sheet for Patients:  EntrepreneurPulse.com.au  Fact Sheet for Healthcare Providers:  IncredibleEmployment.be  This test is no t yet approved or cleared by the [place] FDA and  has been authorized for detection and/or diagnosis of SARS-CoV-2 by FDA under an Emergency Use Authorization (EUA). This EUA will remain  in effect (meaning this test can be used) for the duration of the COVID-19 declaration  under Section 564(b)(1) of the Act, 21 U.S.C.section 360bbb-3(b)(1), unless the authorization is terminated  or revoked sooner.       Influenza A by PCR NEGATIVE NEGATIVE Final   Influenza B by PCR NEGATIVE NEGATIVE Final    Comment: (NOTE) The Xpert Xpress SARS-CoV-2/FLU/RSV plus assay is intended as an aid in the diagnosis of influenza from Nasopharyngeal swab specimens and should not be used as a sole basis for treatment. Nasal washings and aspirates are unacceptable for Xpert Xpress SARS-CoV-2/FLU/RSV testing.  Fact Sheet for Patients: EntrepreneurPulse.com.au  Fact Sheet for Healthcare Providers: IncredibleEmployment.be  This test is not yet approved or cleared by the [place] FDA and has been authorized for detection and/or diagnosis of SARS-CoV-2 by FDA under an Emergency Use Authorization (EUA). This EUA will remain in effect (meaning this test can be used) for the duration of the COVID-19 declaration under Section 564(b)(1) of the Act, 21 U.S.C. section 360bbb-3(b)(1), unless the authorization is terminated or revoked.  Performed at [facility], [place] [address], [place], [place] [address]      Medications:   . acidophilus  1 capsule Oral Daily  . amLODipine  10 mg Oral QHS  . ascorbic acid  500 mg Oral Daily  . aspirin EC  81 mg  Oral Daily  . atorvastatin  40 mg Oral QHS  . azelastine  2 spray Each Nare BID  . cholecalciferol  1,000 Units Oral Daily  . magnesium oxide  400 mg Oral Daily  . melatonin  10 mg Oral QHS  .  morphine injection  4 mg Intravenous Once  . nebivolol  10 mg Oral q AM  . ondansetron (ZOFRAN) IV  4 mg Intravenous Once   Continuous Infusions: . sodium chloride 100 mL/hr at 09/23/20 0735  . cefTRIAXone (ROCEPHIN)  IV 2 g (09/22/20 2125)  . sodium chloride        LOS: 0 days   Charlynne Cousins  [facility]  09/23/2020, 8:31 AM

## 2020-09-24 DIAGNOSIS — N10 Acute tubulo-interstitial nephritis: Secondary | ICD-10-CM | POA: Diagnosis not present

## 2020-09-24 LAB — CBC WITH DIFFERENTIAL/PLATELET
Abs Immature Granulocytes: 0.07 10*3/uL (ref 0.00–0.07)
Basophils Absolute: 0 10*3/uL (ref 0.0–0.1)
Basophils Relative: 0 %
Eosinophils Absolute: 0 10*3/uL (ref 0.0–0.5)
Eosinophils Relative: 0 %
HCT: 32.7 % — ABNORMAL LOW (ref 39.0–52.0)
Hemoglobin: 10.9 g/dL — ABNORMAL LOW (ref 13.0–17.0)
Immature Granulocytes: 1 %
Lymphocytes Relative: 5 %
Lymphs Abs: 0.6 10*3/uL — ABNORMAL LOW (ref 0.7–4.0)
MCH: 28.5 pg (ref 26.0–34.0)
MCHC: 33.3 g/dL (ref 30.0–36.0)
MCV: 85.4 fL (ref 80.0–100.0)
Monocytes Absolute: 1.6 10*3/uL — ABNORMAL HIGH (ref 0.1–1.0)
Monocytes Relative: 14 %
Neutro Abs: 8.8 10*3/uL — ABNORMAL HIGH (ref 1.7–7.7)
Neutrophils Relative %: 80 %
Platelets: 166 10*3/uL (ref 150–400)
RBC: 3.83 MIL/uL — ABNORMAL LOW (ref 4.22–5.81)
RDW: 15 % (ref 11.5–15.5)
WBC: 11 10*3/uL — ABNORMAL HIGH (ref 4.0–10.5)
nRBC: 0 % (ref 0.0–0.2)

## 2020-09-24 MED ORDER — POLYETHYLENE GLYCOL 3350 17 G PO PACK
17.0000 g | PACK | Freq: Two times a day (BID) | ORAL | Status: DC
  Administered 2020-09-24 – 2020-09-25 (×3): 17 g via ORAL
  Filled 2020-09-24 (×3): qty 1

## 2020-09-24 MED ORDER — PANTOPRAZOLE SODIUM 40 MG PO TBEC
40.0000 mg | DELAYED_RELEASE_TABLET | Freq: Two times a day (BID) | ORAL | Status: DC
  Administered 2020-09-24 – 2020-09-25 (×3): 40 mg via ORAL
  Filled 2020-09-24 (×3): qty 1

## 2020-09-24 NOTE — Progress Notes (Signed)
[facility] PROGRESS NOTE    Progress Note  Kenneth Daniel  PXT:[id number] DOB: [birth year] DOA: 09/22/2020 PCP: Lucianne Lei, MD     Brief Narrative:   Kenneth Daniel is an 75 y.o. male past medical history significant for prostate cancer status post prostatectomy, hormonal manipulation 6 months ago, essential hypertension comes in with left-sided abdominal pain nausea and fever that started about 2 days prior to admission.  He also relates dysuria.  In the ED was found to have a T-max of 102, white count of 16 CT scan of the abdomen and pelvis without contrast showed asymmetric left perinephric soft tissue stranding no evidence of hydronephrosis suspicious for Pyelo  Antibiotics: 09/22/2020 IV Rocephin Microbiology data: Blood culture 09/02/2020: Urine culture  Procedures: None  Assessment/Plan:    Acute pyelonephritis: CT scan of the abdomen perinephric stranding with a complex cyst. He was started on IV Rocephin urine cultures remain negative till date, blood culture results are pending. He is defervescing leukocytosis improved. Continue IV Rocephin, nausea has improved appetite is slowly returning we will advance it to a soft diet. Physical therapy evaluated the patient and recommended home health PT.  Essential hypertension: Continue amlodipine and beta-blockers hold ACE inhibitor and diuretic therapy.  History of prostate cancer: Continue hormonal therapy to follow-up with urology as an outpatient.  Hyperlipidemia: Continue statins.   DVT prophylaxis: lovenoxn Family Communication:one Status is: Observation  The patient will require care spanning > 2 midnights and should be moved to inpatient because: Hemodynamically unstable  Dispo: The patient is from: Home              Anticipated d/c is to: Home              Patient currently is not medically stable to d/c.   Difficult to place patient No        Code Status:     Code Status Orders  (From  admission, onward)         Start     Ordered   09/22/20 1605  Full code  Continuous        09/22/20 1605        Code Status History    Date Active Date Inactive Code Status Order ID Comments User Context   11/09/2019 1039 11/10/2019 1649 Full Code [id number]  Justice Britain, PA-C Inpatient   Advance Care Planning Activity    Advance Directive Documentation   Flowsheet Row Most Recent Value  Type of Advance Directive Living will  Pre-existing out of facility DNR order (yellow form or pink MOST form) --  "MOST" Form in Place? --        IV Access:    Peripheral IV   Procedures and diagnostic studies:   CT ABDOMEN W CONTRAST  Result Date: 09/22/2020 CLINICAL DATA:  Severe left flank pain. Left pyelonephritis. Indeterminate cystic renal lesions on recent CT. Evaluate for renal abscess or neoplasm. EXAM: CT ABDOMEN WITHOUT AND WITH CONTRAST TECHNIQUE: Multidetector CT imaging of the abdomen was performed following the standard protocol before and following the bolus administration of intravenous contrast. CONTRAST:  28mL OMNIPAQUE IOHEXOL 350 MG/ML SOLN COMPARISON:  Noncontrast CT 09/22/2020 and 01/04/2020 FINDINGS: Lower chest: Increased bibasilar subsegmental atelectasis. Hepatobiliary: No hepatic masses identified. Multiple benign hepatic cysts are again seen, largest in the left lobe measuring 8.5 cm. Gallbladder is unremarkable. No evidence of biliary ductal dilatation. Pancreas:  No mass or inflammatory changes. Spleen:  Within normal limits in size and appearance. Adrenals/Urinary Tract:  Normal adrenal glands. A few benign right renal cysts are again seen. Multiple left renal cysts are also seen, with dominant mildly complex cyst in the anterior midpole of the left kidney which shows thin peripheral wall enhancement and a few thin internal septations. No thickened septations or solid component identified. This has features of a usually benign Bosniak category 2 F cystic lesion. This  measures 7.0 x 6.8 cm, compared to 6.2 x 4.9 cm on 01/04/2020 exam. No evidence hydronephrosis. Asymmetric left perinephric soft tissue stranding is seen, however there is no evidence hydronephrosis. Stomach/Bowel: Visualized portion unremarkable. Vascular/Lymphatic: No pathologically enlarged lymph nodes identified. No abdominal aortic aneurysm. Aortic atherosclerotic calcification noted. Other:  None. Musculoskeletal:  No suspicious bone lesions identified. IMPRESSION: Asymmetric left perinephric soft tissue stranding. No evidence of hydronephrosis. This is suspicious for pyelonephritis. Recommend correlation with urinalysis. No evidence of solid renal mass or abscess. Indeterminate but usually benign Bosniak category 62F cystic lesion of left kidney. This shows no change in morphology, but is mildly increased in size since prior study on 01/04/2020. Recommend continued imaging follow-up in 6 months, preferably with abdomen MRI without and with contrast. This recommendation follows ACR consensus guidelines: Management of the Incidental Renal Mass on CT: A White Paper of the ACR Incidental Findings Committee. J Am Coll Radiol [phone]. Stable benign hepatic cysts. Aortic Atherosclerosis (ICD10-I70.0). Electronically Signed   By: Marlaine Hind M.D.   On: 09/22/2020 18:21   DG Chest Portable 1 View  Result Date: 09/22/2020 CLINICAL DATA:  Upper abdominal pain. EXAM: PORTABLE CHEST 1 VIEW COMPARISON:  None. FINDINGS: Linear bibasilar atelectasis. No confluent consolidation. Normal heart size and mediastinal contours. No pulmonary edema, pleural effusion, or pneumothorax. No acute osseous abnormalities are seen. IMPRESSION: Linear bibasilar atelectasis. Electronically Signed   By: Keith Rake M.D.   On: 09/22/2020 16:27   DG Abd 2 Views  Result Date: 09/23/2020 CLINICAL DATA:  75 year old male with concern for bowel obstruction EXAM: ABDOMEN - 2 VIEW COMPARISON:  CT abdomen pelvis dated 09/22/2020.  FINDINGS: There is large amount of stool throughout the colon. No definite bowel dilatation or evidence of obstruction. Evaluation however is limited due to body habitus. No free air identified. The osseous structures are intact. IMPRESSION: Constipation. No bowel obstruction. Electronically Signed   By: Anner Crete M.D.   On: 09/23/2020 23:14   CT Renal Stone Study  Result Date: 09/22/2020 CLINICAL DATA:  Left flank and left lower quadrant pain. Clinical suspicion for a kidney stone. EXAM: CT ABDOMEN AND PELVIS WITHOUT CONTRAST TECHNIQUE: Multidetector CT imaging of the abdomen and pelvis was performed following the standard protocol without IV contrast. COMPARISON:  01/04/2020. FINDINGS: Lower chest: Mild bronchiectasis. Linear areas of atelectasis noted at both bases, new from the prior exam. Hepatobiliary: Liver normal in overall size. Multiple cysts, largest arising from the left lobe, 9.0 x 7.3 cm. Cysts stable from the prior CT. Gallbladder minimally distended. No convincing stones. No wall thickening or adjacent inflammation. No bile duct dilation. Pancreas: Unremarkable. No pancreatic ductal dilatation or surrounding inflammatory changes. Spleen: Normal in size without focal abnormality. Adrenals/Urinary Tract: No adrenal masses. Heterogeneous mass arises from the anterior midpole of the left kidney, measuring 6.9 x 6.8 cm transversely by 7.6 cm superior to inferior. This measured 5.1 x 4.8 cm transversely on the prior exam and was homogeneously low in attenuation. Current average Hounsfield units are 25, previously 15. There is adjacent perinephric stranding and extends to the anterior pararenal fascia. There are additional  low-attenuation renal masses that are stable consistent with cysts. No renal stones. No hydronephrosis. Ureters normal in course and in caliber. Bladder is unremarkable. Stomach/Bowel: Normal stomach. Small bowel is normal in caliber. A loop of small bowel extends into a right  inguinal hernia without evidence of obstruction or incarceration. There is a small amount of associated fluid. No small bowel wall thickening or adjacent inflammation. Colon is normal in caliber. There is mild to moderate increase in the colonic stool burden. No colonic wall thickening or inflammation. No evidence of appendicitis. Vascular/Lymphatic: Mild aortic atherosclerosis. No aneurysm. No enlarged lymph nodes. Reproductive: Status post prostatectomy. Other: No other abdominal wall hernias.  No ascites. Musculoskeletal: No fracture or acute finding. No osteoblastic or osteolytic lesions. IMPRESSION: 1. Left renal mass, heterogeneous in attenuation and associated with adjacent inflammation. This has increased in size from the prior CT with the heterogeneity and inflammation being new. Suspect this is a hemorrhagic renal cyst. However, a solid renal mass is not excluded based on this unenhanced study. This could be further assessed/characterized with renal ultrasound, but would be best characterized with renal MRI without and with contrast. 2. No other evidence of an acute abnormality. No renal or ureteral stones or obstructive uropathy. 3. Aortic atherosclerosis. 4. Mild to moderate increase in the colonic stool burden which is similar to the prior CT. Electronically Signed   By: Lajean Manes M.D.   On: 09/22/2020 13:02     Medical Consultants:    None.   Subjective:    Kenneth Daniel tolerating his meals he would like to try soft diet this afternoon.  Objective:    Vitals:   09/23/20 1635 09/23/20 2123 09/24/20 0501 09/24/20 0909  BP: 139/84 139/81 130/82 133/85  Pulse: 94 100 91 89  Resp: 16 17 18 17   Temp: 99.3 F (37.4 C) 99.9 F (37.7 C) 100.2 F (37.9 C) 98.2 F (36.8 C)  TempSrc: Oral Oral Oral   SpO2: 95% 95% 93% 94%  Weight:  83.5 kg    Height:       SpO2: 94 %   Intake/Output Summary (Last 24 hours) at 09/24/2020 0923 Last data filed at 09/24/2020 0800 Gross per 24  hour  Intake 600 ml  Output 550 ml  Net 50 ml   Filed Weights   09/22/20 1200 09/23/20 2123  Weight: 83.5 kg 83.5 kg    Exam: General exam: In no acute distress. Respiratory system: Good air movement and clear to auscultation. Cardiovascular system: S1 & S2 heard, RRR. No JVD. Gastrointestinal system: Abdomen is nondistended, soft and nontender.  Extremities: No pedal edema. Skin: No rashes, lesions or ulcers Psychiatry: Judgement and insight appear normal. Mood & affect appropriate.   Data Reviewed:    Labs: Basic Metabolic Panel: Recent Labs  Lab 09/21/20 1819 09/22/20 1337 09/23/20 0556  NA 134* 134* 135  K 3.7 3.7 3.8  CL 103 99 99  CO2 25 26 26   GLUCOSE 126* 137* 130*  BUN 15 14 16   CREATININE 1.05 1.02 1.03  CALCIUM 9.1 8.9 8.8*   GFR Estimated Creatinine Clearance: 73.2 mL/min (by C-G formula based on SCr of 1.03 mg/dL). Liver Function Tests: Recent Labs  Lab 09/21/20 1819 09/22/20 1337  AST 17 19  ALT 21 21  ALKPHOS 74 76  BILITOT 0.6 1.2  PROT 7.0 7.5  ALBUMIN 3.5 3.2*   Recent Labs  Lab 09/21/20 1819 09/22/20 1337  LIPASE 35 32   No results for input(s): AMMONIA in the  last 168 hours. Coagulation profile No results for input(s): INR, PROTIME in the last 168 hours. COVID-19 Labs  No results for input(s): DDIMER, FERRITIN, LDH, CRP in the last 72 hours.  Lab Results  Component Value Date   SARSCOV2NAA NEGATIVE 09/22/2020   [place] NEGATIVE 11/07/2019    CBC: Recent Labs  Lab 09/21/20 1819 09/22/20 1337 09/23/20 0556 09/24/20 0410  WBC 9.0 15.9* 15.8* 11.0*  NEUTROABS  --  13.3*  --  8.8*  HGB 11.7* 12.3* 11.8* 10.9*  HCT 36.5* 37.9* 36.2* 32.7*  MCV 87.3 88.1 86.4 85.4  PLT 170 166 165 166   Cardiac Enzymes: No results for input(s): CKTOTAL, CKMB, CKMBINDEX, TROPONINI in the last 168 hours. BNP (last 3 results) No results for input(s): PROBNP in the last 8760 hours. CBG: No results for input(s): GLUCAP in the last  168 hours. D-Dimer: No results for input(s): DDIMER in the last 72 hours. Hgb A1c: No results for input(s): HGBA1C in the last 72 hours. Lipid Profile: No results for input(s): CHOL, HDL, LDLCALC, TRIG, CHOLHDL, LDLDIRECT in the last 72 hours. Thyroid function studies: No results for input(s): TSH, T4TOTAL, T3FREE, THYROIDAB in the last 72 hours.  Invalid input(s): FREET3 Anemia work up: No results for input(s): VITAMINB12, FOLATE, FERRITIN, TIBC, IRON, RETICCTPCT in the last 72 hours. Sepsis Labs: Recent Labs  Lab 09/21/20 1819 09/21/20 2135 09/22/20 1337 09/22/20 1630 09/22/20 1936 09/23/20 0556 09/24/20 0410  WBC 9.0  --  15.9*  --   --  15.8* 11.0*  LATICACIDVEN  --  1.0  --  2.2* 2.7*  --   --    Microbiology Recent Results (from the past 240 hour(s))  Blood culture (routine x 2)     Status: None (Preliminary result)   Collection Time: 09/21/20  9:25 PM   Specimen: BLOOD  Result Value Ref Range Status   Specimen Description BLOOD SITE NOT SPECIFIED  Final   Special Requests   Final    BOTTLES DRAWN AEROBIC AND ANAEROBIC Blood Culture adequate volume   Culture   Final    NO GROWTH 3 DAYS Performed at [facility], [address], [place], [place] [address]    Report Status PENDING  Incomplete  Blood culture (routine x 2)     Status: None (Preliminary result)   Collection Time: 09/21/20  9:35 PM   Specimen: BLOOD  Result Value Ref Range Status   Specimen Description BLOOD SITE NOT SPECIFIED  Final   Special Requests   Final    BOTTLES DRAWN AEROBIC AND ANAEROBIC Blood Culture adequate volume   Culture   Final    NO GROWTH 3 DAYS Performed at [facility], [address] [place], [place] [address]    Report Status PENDING  Incomplete  Urine culture     Status: None   Collection Time: 09/22/20  2:12 PM   Specimen: Urine, Random  Result Value Ref Range Status   Specimen Description URINE, RANDOM  Final   Special Requests NONE  Final   Culture    Final    NO GROWTH Performed at [facility], [place] [address] [place], [place] [address]    Report Status 09/23/2020 FINAL  Final  Resp Panel by RT-PCR (Flu A&B, Covid) Nasopharyngeal Swab     Status: None   Collection Time: 09/22/20  4:30 PM   Specimen: Nasopharyngeal Swab; Nasopharyngeal(NP) swabs in vial transport medium  Result Value Ref Range Status   SARS Coronavirus  2 by RT PCR NEGATIVE NEGATIVE Final    Comment: (NOTE) SARS-CoV-2 target nucleic acids are NOT DETECTED.  The SARS-CoV-2 RNA is generally detectable in upper respiratory specimens during the acute phase of infection. The lowest concentration of SARS-CoV-2 viral copies this assay can detect is 138 copies/mL. A negative result does not preclude SARS-Cov-2 infection and should not be used as the sole basis for treatment or other patient management decisions. A negative result may occur with  improper specimen collection/handling, submission of specimen other than nasopharyngeal swab, presence of viral mutation(s) within the areas targeted by this assay, and inadequate number of viral copies(<138 copies/mL). A negative result must be combined with clinical observations, patient history, and epidemiological information. The expected result is Negative.  Fact Sheet for Patients:  EntrepreneurPulse.com.au  Fact Sheet for Healthcare Providers:  IncredibleEmployment.be  This test is no t yet approved or cleared by the [place] FDA and  has been authorized for detection and/or diagnosis of SARS-CoV-2 by FDA under an Emergency Use Authorization (EUA). This EUA will remain  in effect (meaning this test can be used) for the duration of the COVID-19 declaration under Section 564(b)(1) of the Act, 21 U.S.C.section 360bbb-3(b)(1), unless the authorization is terminated  or revoked sooner.       Influenza A by PCR NEGATIVE NEGATIVE Final   Influenza B by PCR NEGATIVE NEGATIVE  Final    Comment: (NOTE) The Xpert Xpress SARS-CoV-2/FLU/RSV plus assay is intended as an aid in the diagnosis of influenza from Nasopharyngeal swab specimens and should not be used as a sole basis for treatment. Nasal washings and aspirates are unacceptable for Xpert Xpress SARS-CoV-2/FLU/RSV testing.  Fact Sheet for Patients: EntrepreneurPulse.com.au  Fact Sheet for Healthcare Providers: IncredibleEmployment.be  This test is not yet approved or cleared by the [place] FDA and has been authorized for detection and/or diagnosis of SARS-CoV-2 by FDA under an Emergency Use Authorization (EUA). This EUA will remain in effect (meaning this test can be used) for the duration of the COVID-19 declaration under Section 564(b)(1) of the Act, 21 U.S.C. section 360bbb-3(b)(1), unless the authorization is terminated or revoked.  Performed at [facility], [place] [address], [place], [place] [address]      Medications:   . acidophilus  1 capsule Oral Daily  . amLODipine  10 mg Oral QHS  . ascorbic acid  500 mg Oral Daily  . aspirin EC  81 mg Oral Daily  . atorvastatin  40 mg Oral QHS  . azelastine  2 spray Each Nare BID  . cholecalciferol  1,000 Units Oral Daily  . magnesium oxide  400 mg Oral Daily  . melatonin  10 mg Oral QHS  .  morphine injection  4 mg Intravenous Once  . nebivolol  10 mg Oral q AM  . ondansetron (ZOFRAN) IV  4 mg Intravenous Once  . polyethylene glycol  17 g Oral BID   Continuous Infusions: . cefTRIAXone (ROCEPHIN)  IV 2 g (09/23/20 2214)  . sodium chloride        LOS: 1 day   Charlynne Cousins  [facility]  09/24/2020, 9:23 AM

## 2020-09-24 NOTE — Telephone Encounter (Signed)
Error

## 2020-09-24 NOTE — Progress Notes (Signed)
Physical Therapy Treatment Patient Details Name: Kenneth Daniel MRN: [id number] DOB: 1945-05-11 Today's Date: 09/24/2020    History of Present Illness 75 yo male admitted 09/22/20 c/o L flank pain. Found to have acute pyelonephritis. PMH HOH, HLD, HTN, prostate CA, ACDF    PT Comments    Pt sitting up in chair, barely eaten lunch in front of him. Pt reports he knows that he needs to eat his lunch but he has increased stomach pain which he has already been medicated for. Pt hopeful walking with therapy will help the situation. Pt is min guard for transfers and ambulation in hallway with RW. With return to room pt reports his stomach pain is still 8/10. With resolution of ongoing stomach pain PT continues to recommend HHPT level rehab at discharge. PT will continue to follow acutely.    Follow Up Recommendations  Home health PT     Equipment Recommendations  Rolling walker with 5" wheels;3in1 (PT)       Precautions / Restrictions Precautions Precautions: Fall;Other (comment) Precaution Comments: nausea, stomach pain Restrictions Weight Bearing Restrictions: No    Mobility  Bed Mobility               General bed mobility comments: received sitting at EOB    Transfers Overall transfer level: Needs assistance Equipment used: None Transfers: Sit to/from Stand Sit to Stand: Min guard         General transfer comment: min guard for safety with power up and steadying  Ambulation/Gait Ambulation/Gait assistance: Min guard Gait Distance (Feet): 80 Feet Assistive device: Rolling walker (2 wheeled) Gait Pattern/deviations: Step-through pattern;Decreased step length - right;Decreased step length - left;Trunk flexed;Antalgic;Narrow base of support Gait velocity: decreased Gait velocity interpretation: <1.31 ft/sec, indicative of household ambulator General Gait Details: very slow         Balance Overall balance assessment: Needs assistance Sitting-balance support: No  upper extremity supported;Feet supported Sitting balance-Leahy Scale: Good     Standing balance support: During functional activity;No upper extremity supported Standing balance-Leahy Scale: Poor Standing balance comment: requires single UE support                            Cognition Arousal/Alertness: Awake/alert Behavior During Therapy: Flat affect Overall Cognitive Status: Within Functional Limits for tasks assessed                                 General Comments: agreeable to therapy, hopeful it will help his stomach pain         General Comments General comments (skin integrity, edema, etc.): VSS on RA      Pertinent Vitals/Pain Pain Assessment: 0-10 Pain Score: 8  Pain Location: stomach Pain Descriptors / Indicators: Discomfort;Sharp Pain Intervention(s): Monitored during session;Repositioned           PT Goals (current goals can now be found in the care plan section) Acute Rehab PT Goals Patient Stated Goal: less pain PT Goal Formulation: With patient Time For Goal Achievement: 10/07/20 Potential to Achieve Goals: Good    Frequency    Min 3X/week      PT Plan Current plan remains appropriate       AM-PAC PT "6 Clicks" Mobility   Outcome Measure  Help needed turning from your back to your side while in a flat bed without using bedrails?: A Little Help needed moving from lying on your  back to sitting on the side of a flat bed without using bedrails?: A Little Help needed moving to and from a bed to a chair (including a wheelchair)?: A Little Help needed standing up from a chair using your arms (e.g., wheelchair or bedside chair)?: A Little Help needed to walk in hospital room?: A Little Help needed climbing 3-5 steps with a railing? : A Lot 6 Click Score: 17    End of Session Equipment Utilized During Treatment: Gait belt Activity Tolerance: Patient tolerated treatment well Patient left: in chair;with call bell/phone  within reach;with chair alarm set Nurse Communication: Mobility status PT Visit Diagnosis: Unsteadiness on feet (R26.81);Muscle weakness (generalized) (M62.81);Pain;Difficulty in walking, not elsewhere classified (R26.2) Pain - Right/Left:  (midline) Pain - part of body:  (abdominal)     Time: 0086-7619 PT Time Calculation (min) (ACUTE ONLY): 18 min  Charges:  $Therapeutic Exercise: 8-22 mins                     Lakashia Collison B. Migdalia Dk PT, DPT Acute Rehabilitation Services Pager [phone] Office [phone]    [place] 09/24/2020, 3:34 PM

## 2020-09-25 DIAGNOSIS — N10 Acute tubulo-interstitial nephritis: Secondary | ICD-10-CM | POA: Diagnosis not present

## 2020-09-25 DIAGNOSIS — R531 Weakness: Secondary | ICD-10-CM

## 2020-09-25 DIAGNOSIS — R6520 Severe sepsis without septic shock: Secondary | ICD-10-CM

## 2020-09-25 DIAGNOSIS — I10 Essential (primary) hypertension: Secondary | ICD-10-CM

## 2020-09-25 DIAGNOSIS — A419 Sepsis, unspecified organism: Secondary | ICD-10-CM

## 2020-09-25 DIAGNOSIS — I5032 Chronic diastolic (congestive) heart failure: Secondary | ICD-10-CM

## 2020-09-25 DIAGNOSIS — R600 Localized edema: Secondary | ICD-10-CM | POA: Diagnosis not present

## 2020-09-25 LAB — CBC WITH DIFFERENTIAL/PLATELET
Abs Immature Granulocytes: 0.06 10*3/uL (ref 0.00–0.07)
Basophils Absolute: 0 10*3/uL (ref 0.0–0.1)
Basophils Relative: 0 %
Eosinophils Absolute: 0 10*3/uL (ref 0.0–0.5)
Eosinophils Relative: 0 %
HCT: 35 % — ABNORMAL LOW (ref 39.0–52.0)
Hemoglobin: 11.6 g/dL — ABNORMAL LOW (ref 13.0–17.0)
Immature Granulocytes: 1 %
Lymphocytes Relative: 7 %
Lymphs Abs: 0.6 10*3/uL — ABNORMAL LOW (ref 0.7–4.0)
MCH: 28.2 pg (ref 26.0–34.0)
MCHC: 33.1 g/dL (ref 30.0–36.0)
MCV: 85 fL (ref 80.0–100.0)
Monocytes Absolute: 1.1 10*3/uL — ABNORMAL HIGH (ref 0.1–1.0)
Monocytes Relative: 13 %
Neutro Abs: 6.7 10*3/uL (ref 1.7–7.7)
Neutrophils Relative %: 79 %
Platelets: 186 10*3/uL (ref 150–400)
RBC: 4.12 MIL/uL — ABNORMAL LOW (ref 4.22–5.81)
RDW: 15.1 % (ref 11.5–15.5)
WBC: 8.4 10*3/uL (ref 4.0–10.5)
nRBC: 0 % (ref 0.0–0.2)

## 2020-09-25 MED ORDER — CEPHALEXIN 500 MG PO CAPS: 500.0000 mg | ORAL_CAPSULE | Freq: Three times a day (TID) | ORAL | 0 refills | Status: AC

## 2020-09-25 NOTE — Discharge Summary (Signed)
Physician Discharge Summary  Kenneth Daniel CHY:[id number] DOB: 16-Mar-1946 DOA: 09/22/2020  PCP: Lucianne Lei, MD  Admit date: 09/22/2020 Discharge date: 09/25/2020  Admitted From: Home Disposition: Home  Recommendations for Outpatient Follow-up:  1. Follow ups as below. 2. Please obtain CBC/BMP/Mag at follow up 3. Please follow up on the following pending results: None  Home Health: HH PT/OT Equipment/Devices: Rolling walker  Discharge Condition: Stable CODE STATUS: Full code   Follow-up Information    [facility], [place] Follow up.   Specialty: [place] Why: A representative from [facility] will contact you to arrange start date and time for therapy. Contact information: [place] [address] [phone]        Lucianne Lei, MD. Schedule an appointment as soon as possible for a visit in 1 week(s).   Specialty: Family Medicine Contact information: [address] [place] [address] [phone]                Hospital Course: 75 year old M with PMH of prostate cancer s/p prostatectomy and hormonal therapy, right carotid artery stenosis and HTN presenting with left-sided abdominal pain, fever, nausea and dysuria, and admitted for acute left-sided pyelonephritis.  Upon review of his chart, patient meets severe sepsis with fever, leukocytosis and lactic acidosis on presentation.  Patient was treated with IV ceftriaxone with improvement in his symptoms.  Urine culture was negative.  He was discharged on p.o. Keflex for 10 more days.   See individual problem list below for more hospital course.  Discharge Diagnoses:  Severe sepsis due to acute pyelonephritis: POA.  Had a fever, leukocytosis and lactic acidosis.  CT scan of the abdomen perinephric stranding with a complex cyst.  Treated with IV ceftriaxone for 3 days, and discharged on p.o. Keflex for 10 more days.  Essential hypertension:  Normotensive -Continue home Bystolic and Diovan HCT -Discontinued amlodipine given significant edema.  Chronic diastolic CHF: TTE in 08/7094 with LVEF of 68%, G1 DD and moderate AVR.  Patient is not on Lasix at home.  Has significant LE edema but no cardiopulmonary symptoms. -TED hose -Discontinue amlodipine given edema -Consider p.o. Lasix and instead of HCTZ in the future  History of prostate cancer s/p prostatectomy and hormonal therapy -Outpatient follow-up with urology  Hyperlipidemia: -Continue statins.  Debility/physical deconditioning: likely due to acute illness -Home health PT/OT with rolling walker  Body mass index is 23.63 kg/m.            Discharge Exam: Vitals:   09/25/20 0500 09/25/20 1000  BP: 130/80 129/80  Pulse: 91 87  Resp: 18 17  Temp: 99.1 F (37.3 C) 98.4 F (36.9 C)  SpO2: 95% 93%    GENERAL: No apparent distress.  Nontoxic. HEENT: MMM.  Vision and hearing grossly intact.  NECK: Supple.  No apparent JVD.  RESP: On RA.  No IWOB.  Fair aeration bilaterally. CVS:  RRR. Heart sounds normal.  ABD/GI/GU: Bowel sounds present. Soft. Non tender.  MSK/EXT:  Moves extremities. No apparent deformity.  1+ pitting edema bilaterally SKIN: no apparent skin lesion or wound NEURO: Awake, alert and oriented appropriately.  No apparent focal neuro deficit. PSYCH: Calm. Normal affect.   Discharge Instructions  Discharge Instructions    Call MD for:  extreme fatigue   Complete by: As directed    Call MD for:  persistant dizziness or light-headedness   Complete by: As directed    Call MD for:  persistant nausea  and vomiting   Complete by: As directed    Call MD for:  severe uncontrolled pain   Complete by: As directed    Call MD for:  temperature >100.4   Complete by: As directed    Diet - low sodium heart healthy   Complete by: As directed    Discharge instructions   Complete by: As directed    It has been a pleasure taking care of you!  You  were hospitalized with left abdominal pain, fever, nausea and burning with urination likely due to left kidney infection.  You have been treated with IV antibiotics and your symptoms improved to the point we think it is safe to let you go home and follow-up with your primary care doctor.  We are discharging you on more antibiotics to complete treatment course for the next 10 more days.   Please review your new medication list and the directions on your medications before you take them.   Take care,   Increase activity slowly   Complete by: As directed      Allergies as of 09/25/2020   No Known Allergies     Medication List    STOP taking these medications   amLODipine 10 MG tablet Commonly known as: NORVASC     TAKE these medications   acetaminophen 500 MG tablet Commonly known as: TYLENOL Take 1,000 mg by mouth every 8 (eight) hours as needed for moderate pain.   aluminum hydroxide-magnesium carbonate 95-358 MG/15ML Susp Commonly known as: GAVISCON Take 30 mLs by mouth as needed for indigestion or heartburn.   ascorbic acid 500 MG tablet Commonly known as: VITAMIN C Take 500 mg by mouth daily.   aspirin 81 MG EC tablet Take 81 mg by mouth daily.   atorvastatin 40 MG tablet Commonly known as: LIPITOR Take 40 mg by mouth at bedtime.   azelastine 0.1 % nasal spray Commonly known as: ASTELIN PLACE 2 SPRAYS INTO BOTH NOSTRILS 2 (TWO) TIMES DAILY   b complex vitamins tablet Take 1 tablet by mouth daily.   cephALEXin 500 MG capsule Commonly known as: KEFLEX Take 1 capsule (500 mg total) by mouth 3 (three) times daily for 10 days.   cholecalciferol 25 MCG (1000 UNIT) tablet Commonly known as: VITAMIN D3 Take 1,000 Units by mouth daily.   ECHINACEA EXTRACT PO Take 1 capsule by mouth daily as needed (cold symptoms).   JUICE PLUS FIBRE PO Take 2 capsules by mouth daily.   Magnesium 250 MG Tabs Take 250 mg by mouth daily.   Melatonin 5 MG Caps Take 10 mg by mouth  at bedtime.   nebivolol 10 MG tablet Commonly known as: BYSTOLIC TAKE 1 TABLET (10 MG TOTAL) BY MOUTH IN THE MORNING. What changed: when to take this   PROBIOTIC DAILY PO Take 1 capsule by mouth daily.   valsartan-hydrochlorothiazide 320-12.5 MG tablet Commonly known as: DIOVAN-HCT Take 1 tablet by mouth daily.   zinc gluconate 50 MG tablet Take 50 mg by mouth daily as needed (supplement).       Consultations:  None  Procedures/Studies:   CT ABDOMEN W CONTRAST  Result Date: 09/22/2020 CLINICAL DATA:  Severe left flank pain. Left pyelonephritis. Indeterminate cystic renal lesions on recent CT. Evaluate for renal abscess or neoplasm. EXAM: CT ABDOMEN WITHOUT AND WITH CONTRAST TECHNIQUE: Multidetector CT imaging of the abdomen was performed following the standard protocol before and following the bolus administration of intravenous contrast. CONTRAST:  11mL OMNIPAQUE IOHEXOL 350 MG/ML SOLN  COMPARISON:  Noncontrast CT 09/22/2020 and 01/04/2020 FINDINGS: Lower chest: Increased bibasilar subsegmental atelectasis. Hepatobiliary: No hepatic masses identified. Multiple benign hepatic cysts are again seen, largest in the left lobe measuring 8.5 cm. Gallbladder is unremarkable. No evidence of biliary ductal dilatation. Pancreas:  No mass or inflammatory changes. Spleen:  Within normal limits in size and appearance. Adrenals/Urinary Tract: Normal adrenal glands. A few benign right renal cysts are again seen. Multiple left renal cysts are also seen, with dominant mildly complex cyst in the anterior midpole of the left kidney which shows thin peripheral wall enhancement and a few thin internal septations. No thickened septations or solid component identified. This has features of a usually benign Bosniak category 2 F cystic lesion. This measures 7.0 x 6.8 cm, compared to 6.2 x 4.9 cm on 01/04/2020 exam. No evidence hydronephrosis. Asymmetric left perinephric soft tissue stranding is seen, however  there is no evidence hydronephrosis. Stomach/Bowel: Visualized portion unremarkable. Vascular/Lymphatic: No pathologically enlarged lymph nodes identified. No abdominal aortic aneurysm. Aortic atherosclerotic calcification noted. Other:  None. Musculoskeletal:  No suspicious bone lesions identified. IMPRESSION: Asymmetric left perinephric soft tissue stranding. No evidence of hydronephrosis. This is suspicious for pyelonephritis. Recommend correlation with urinalysis. No evidence of solid renal mass or abscess. Indeterminate but usually benign Bosniak category 71F cystic lesion of left kidney. This shows no change in morphology, but is mildly increased in size since prior study on 01/04/2020. Recommend continued imaging follow-up in 6 months, preferably with abdomen MRI without and with contrast. This recommendation follows ACR consensus guidelines: Management of the Incidental Renal Mass on CT: A White Paper of the ACR Incidental Findings Committee. J Am Coll Radiol [phone]. Stable benign hepatic cysts. Aortic Atherosclerosis (ICD10-I70.0). Electronically Signed   By: Marlaine Hind M.D.   On: 09/22/2020 18:21   DG Chest Portable 1 View  Result Date: 09/22/2020 CLINICAL DATA:  Upper abdominal pain. EXAM: PORTABLE CHEST 1 VIEW COMPARISON:  None. FINDINGS: Linear bibasilar atelectasis. No confluent consolidation. Normal heart size and mediastinal contours. No pulmonary edema, pleural effusion, or pneumothorax. No acute osseous abnormalities are seen. IMPRESSION: Linear bibasilar atelectasis. Electronically Signed   By: Keith Rake M.D.   On: 09/22/2020 16:27   DG Abd 2 Views  Result Date: 09/23/2020 CLINICAL DATA:  75 year old male with concern for bowel obstruction EXAM: ABDOMEN - 2 VIEW COMPARISON:  CT abdomen pelvis dated 09/22/2020. FINDINGS: There is large amount of stool throughout the colon. No definite bowel dilatation or evidence of obstruction. Evaluation however is limited due to body  habitus. No free air identified. The osseous structures are intact. IMPRESSION: Constipation. No bowel obstruction. Electronically Signed   By: Anner Crete M.D.   On: 09/23/2020 23:14   CT Renal Stone Study  Result Date: 09/22/2020 CLINICAL DATA:  Left flank and left lower quadrant pain. Clinical suspicion for a kidney stone. EXAM: CT ABDOMEN AND PELVIS WITHOUT CONTRAST TECHNIQUE: Multidetector CT imaging of the abdomen and pelvis was performed following the standard protocol without IV contrast. COMPARISON:  01/04/2020. FINDINGS: Lower chest: Mild bronchiectasis. Linear areas of atelectasis noted at both bases, new from the prior exam. Hepatobiliary: Liver normal in overall size. Multiple cysts, largest arising from the left lobe, 9.0 x 7.3 cm. Cysts stable from the prior CT. Gallbladder minimally distended. No convincing stones. No wall thickening or adjacent inflammation. No bile duct dilation. Pancreas: Unremarkable. No pancreatic ductal dilatation or surrounding inflammatory changes. Spleen: Normal in size without focal abnormality. Adrenals/Urinary Tract: No adrenal masses. Heterogeneous mass  arises from the anterior midpole of the left kidney, measuring 6.9 x 6.8 cm transversely by 7.6 cm superior to inferior. This measured 5.1 x 4.8 cm transversely on the prior exam and was homogeneously low in attenuation. Current average Hounsfield units are 25, previously 15. There is adjacent perinephric stranding and extends to the anterior pararenal fascia. There are additional low-attenuation renal masses that are stable consistent with cysts. No renal stones. No hydronephrosis. Ureters normal in course and in caliber. Bladder is unremarkable. Stomach/Bowel: Normal stomach. Small bowel is normal in caliber. A loop of small bowel extends into a right inguinal hernia without evidence of obstruction or incarceration. There is a small amount of associated fluid. No small bowel wall thickening or adjacent  inflammation. Colon is normal in caliber. There is mild to moderate increase in the colonic stool burden. No colonic wall thickening or inflammation. No evidence of appendicitis. Vascular/Lymphatic: Mild aortic atherosclerosis. No aneurysm. No enlarged lymph nodes. Reproductive: Status post prostatectomy. Other: No other abdominal wall hernias.  No ascites. Musculoskeletal: No fracture or acute finding. No osteoblastic or osteolytic lesions. IMPRESSION: 1. Left renal mass, heterogeneous in attenuation and associated with adjacent inflammation. This has increased in size from the prior CT with the heterogeneity and inflammation being new. Suspect this is a hemorrhagic renal cyst. However, a solid renal mass is not excluded based on this unenhanced study. This could be further assessed/characterized with renal ultrasound, but would be best characterized with renal MRI without and with contrast. 2. No other evidence of an acute abnormality. No renal or ureteral stones or obstructive uropathy. 3. Aortic atherosclerosis. 4. Mild to moderate increase in the colonic stool burden which is similar to the prior CT. Electronically Signed   By: Lajean Manes M.D.   On: 09/22/2020 13:02        The results of significant diagnostics from this hospitalization (including imaging, microbiology, ancillary and laboratory) are listed below for reference.     Microbiology: Recent Results (from the past 240 hour(s))  Blood culture (routine x 2)     Status: None (Preliminary result)   Collection Time: 09/21/20  9:25 PM   Specimen: BLOOD  Result Value Ref Range Status   Specimen Description BLOOD SITE NOT SPECIFIED  Final   Special Requests   Final    BOTTLES DRAWN AEROBIC AND ANAEROBIC Blood Culture adequate volume   Culture   Final    NO GROWTH 4 DAYS Performed at [facility], [address] [place], [place] [address]    Report Status PENDING  Incomplete  Blood culture (routine x 2)     Status: None  (Preliminary result)   Collection Time: 09/21/20  9:35 PM   Specimen: BLOOD  Result Value Ref Range Status   Specimen Description BLOOD SITE NOT SPECIFIED  Final   Special Requests   Final    BOTTLES DRAWN AEROBIC AND ANAEROBIC Blood Culture adequate volume   Culture   Final    NO GROWTH 4 DAYS Performed at [facility], [address] [place], [place] [address]    Report Status PENDING  Incomplete  Urine culture     Status: None   Collection Time: 09/22/20  2:12 PM   Specimen: Urine, Random  Result Value Ref Range Status   Specimen Description URINE, RANDOM  Final   Special Requests NONE  Final   Culture   Final    NO GROWTH Performed at [facility], [place] [address]  [place], [place] [address]    Report Status 09/23/2020 FINAL  Final  Resp Panel by RT-PCR (Flu A&B, Covid) Nasopharyngeal Swab     Status: None   Collection Time: 09/22/20  4:30 PM   Specimen: Nasopharyngeal Swab; Nasopharyngeal(NP) swabs in vial transport medium  Result Value Ref Range Status   SARS Coronavirus 2 by RT PCR NEGATIVE NEGATIVE Final    Comment: (NOTE) SARS-CoV-2 target nucleic acids are NOT DETECTED.  The SARS-CoV-2 RNA is generally detectable in upper respiratory specimens during the acute phase of infection. The lowest concentration of SARS-CoV-2 viral copies this assay can detect is 138 copies/mL. A negative result does not preclude SARS-Cov-2 infection and should not be used as the sole basis for treatment or other patient management decisions. A negative result may occur with  improper specimen collection/handling, submission of specimen other than nasopharyngeal swab, presence of viral mutation(s) within the areas targeted by this assay, and inadequate number of viral copies(<138 copies/mL). A negative result must be combined with clinical observations, patient history, and epidemiological information. The expected result is Negative.  Fact Sheet for Patients:   EntrepreneurPulse.com.au  Fact Sheet for Healthcare Providers:  IncredibleEmployment.be  This test is no t yet approved or cleared by the [place] FDA and  has been authorized for detection and/or diagnosis of SARS-CoV-2 by FDA under an Emergency Use Authorization (EUA). This EUA will remain  in effect (meaning this test can be used) for the duration of the COVID-19 declaration under Section 564(b)(1) of the Act, 21 U.S.C.section 360bbb-3(b)(1), unless the authorization is terminated  or revoked sooner.       Influenza A by PCR NEGATIVE NEGATIVE Final   Influenza B by PCR NEGATIVE NEGATIVE Final    Comment: (NOTE) The Xpert Xpress SARS-CoV-2/FLU/RSV plus assay is intended as an aid in the diagnosis of influenza from Nasopharyngeal swab specimens and should not be used as a sole basis for treatment. Nasal washings and aspirates are unacceptable for Xpert Xpress SARS-CoV-2/FLU/RSV testing.  Fact Sheet for Patients: EntrepreneurPulse.com.au  Fact Sheet for Healthcare Providers: IncredibleEmployment.be  This test is not yet approved or cleared by the [place] FDA and has been authorized for detection and/or diagnosis of SARS-CoV-2 by FDA under an Emergency Use Authorization (EUA). This EUA will remain in effect (meaning this test can be used) for the duration of the COVID-19 declaration under Section 564(b)(1) of the Act, 21 U.S.C. section 360bbb-3(b)(1), unless the authorization is terminated or revoked.  Performed at [facility], [place] [address], [place], [place] [address]      Labs:  CBC: Recent Labs  Lab 09/21/20 1819 09/22/20 1337 09/23/20 0556 09/24/20 0410 09/25/20 0355  WBC 9.0 15.9* 15.8* 11.0* 8.4  NEUTROABS  --  13.3*  --  8.8* 6.7  HGB 11.7* 12.3* 11.8* 10.9* 11.6*  HCT 36.5* 37.9* 36.2* 32.7* 35.0*  MCV 87.3 88.1 86.4 85.4 85.0  PLT 170 166 165 166 186   BMP  &GFR Recent Labs  Lab 09/21/20 1819 09/22/20 1337 09/23/20 0556  NA 134* 134* 135  K 3.7 3.7 3.8  CL 103 99 99  CO2 25 26 26   GLUCOSE 126* 137* 130*  BUN 15 14 16   CREATININE 1.05 1.02 1.03  CALCIUM 9.1 8.9 8.8*   Estimated Creatinine Clearance: 73.2 mL/min (by C-G formula based on SCr of 1.03 mg/dL). Liver & Pancreas: Recent Labs  Lab 09/21/20 1819 09/22/20 1337  AST 17 19  ALT 21 21  ALKPHOS 74 76  BILITOT 0.6 1.2  PROT 7.0 7.5  ALBUMIN 3.5 3.2*   Recent Labs  Lab 09/21/20 1819 09/22/20 1337  LIPASE 35 32   No results for input(s): AMMONIA in the last 168 hours. Diabetic: No results for input(s): HGBA1C in the last 72 hours. No results for input(s): GLUCAP in the last 168 hours. Cardiac Enzymes: No results for input(s): CKTOTAL, CKMB, CKMBINDEX, TROPONINI in the last 168 hours. No results for input(s): PROBNP in the last 8760 hours. Coagulation Profile: No results for input(s): INR, PROTIME in the last 168 hours. Thyroid Function Tests: No results for input(s): TSH, T4TOTAL, FREET4, T3FREE, THYROIDAB in the last 72 hours. Lipid Profile: No results for input(s): CHOL, HDL, LDLCALC, TRIG, CHOLHDL, LDLDIRECT in the last 72 hours. Anemia Panel: No results for input(s): VITAMINB12, FOLATE, FERRITIN, TIBC, IRON, RETICCTPCT in the last 72 hours. Urine analysis:    Component Value Date/Time   COLORURINE YELLOW 09/22/2020 1438   APPEARANCEUR HAZY (A) 09/22/2020 1438   LABSPEC 1.020 09/22/2020 1438   PHURINE 5.0 09/22/2020 1438   GLUCOSEU NEGATIVE 09/22/2020 1438   HGBUR MODERATE (A) 09/22/2020 1438   BILIRUBINUR NEGATIVE 09/22/2020 1438   KETONESUR 5 (A) 09/22/2020 1438   PROTEINUR 100 (A) 09/22/2020 1438   NITRITE NEGATIVE 09/22/2020 1438   LEUKOCYTESUR SMALL (A) 09/22/2020 1438   Sepsis Labs: Invalid input(s): PROCALCITONIN, LACTICIDVEN   Time coordinating discharge: 40 minutes  SIGNED:  Mercy Riding, MD  [facility] 09/25/2020, 11:37  AM  If 7PM-7AM, please contact night-coverage [email or web address]

## 2020-09-25 NOTE — Progress Notes (Signed)
DISCHARGE NOTE HOME Kenneth Daniel to be discharged Home per MD order. Discussed prescriptions and follow up appointments with the patient. Prescriptions given to patient; medication list explained in detail. Patient verbalized understanding.  Skin clean, dry and intact without evidence of skin break down, no evidence of skin tears noted. IV catheter discontinued intact. Site without signs and symptoms of complications. Dressing and pressure applied. Pt denies pain at the site currently. No complaints noted.  Patient free of lines, drains, and wounds.   An After Visit Summary (AVS) was printed and given to the patient. Patient escorted via wheelchair, and discharged home via private auto.  Mikki Santee, RN

## 2020-09-26 LAB — CULTURE, BLOOD (ROUTINE X 2)
Culture: NO GROWTH
Culture: NO GROWTH
Special Requests: ADEQUATE
Special Requests: ADEQUATE

## 2020-10-05 ENCOUNTER — Ambulatory Visit (HOSPITAL_COMMUNITY)
Admission: EM | Admit: 2020-10-05 | Discharge: 2020-10-05 | Disposition: A | Discharge status: Home / Self Care | Payer: Medicare PPO | Attending: Internal Medicine | Admitting: Internal Medicine

## 2020-10-05 ENCOUNTER — Encounter (HOSPITAL_COMMUNITY): Payer: Self-pay

## 2020-10-05 DIAGNOSIS — Z87448 Personal history of other diseases of urinary system: Secondary | ICD-10-CM

## 2020-10-05 DIAGNOSIS — R350 Frequency of micturition: Secondary | ICD-10-CM | POA: Diagnosis not present

## 2020-10-05 DIAGNOSIS — M545 Low back pain, unspecified: Secondary | ICD-10-CM

## 2020-10-05 LAB — POCT URINALYSIS DIPSTICK, ED / UC
Bilirubin Urine: NEGATIVE
Glucose, UA: NEGATIVE mg/dL
Hgb urine dipstick: NEGATIVE
Ketones, ur: NEGATIVE mg/dL
Leukocytes,Ua: NEGATIVE
Nitrite: NEGATIVE
Protein, ur: NEGATIVE mg/dL
Specific Gravity, Urine: <=1.005 (ref 1.005–1.030)
Urobilinogen, UA: 0.2 mg/dL (ref 0.0–1.0)
pH: 6.5 (ref 5.0–8.0)

## 2020-10-05 NOTE — ED Triage Notes (Signed)
Pt present lower abdominal pain  With back pain. Pt states he recently had a kidney infection  and would like to check to make sure the infection Is going. Pt states he is going to the bathroom more frequently then normal

## 2020-10-05 NOTE — Discharge Instructions (Addendum)
Your urine was normal.  We will send this off for culture to ensure there is not an infection but there was no indication to start antibiotics based on this testing today.  I suspect you strained a muscle.  Please use heat and stretch.  I would also recommend Tylenol over-the-counter.  Keep follow-up appointments with your primary care doctor and urologist next week.  If anything worsens please return for reevaluation.

## 2020-10-05 NOTE — ED Provider Notes (Signed)
[place]    CSN: [id number] Arrival date & time: 10/05/20  1352      History   Chief Complaint Chief Complaint  Patient presents with  . Abdominal Pain  . Back Pain    HPI Kenneth Daniel is a 75 y.o. male.   Patient presents today with a 1 day history of lower back pain that radiates to left abdomen.  He reports pain is rated 2 on a 0-10 pain scale, occurs only with certain movements, no alleviating factors identified.  He has not tried any over-the-counter medications for symptom management.  He has been pushing fluids which provides temporary relief of symptoms.  Patient was recently hospitalized in the end of May 2022 for pyelonephritis and completed course of Keflex with last dose yesterday.  He has working with physical therapist and has been increasing activity level and wonders if this could be contributing to symptoms.  He is primarily concerned about having a urine test to ensure that he does not have a recurrent infection.  He denies any dysuria, hematuria, fever, flank pain, nausea, vomiting.  He denies any bowel/bladder incontinence increased from baseline, saddle anesthesia, lower extremity weakness.  He denies any bowel changes including melena or hematochezia.       Past Medical History:  Diagnosis Date  . Acid reflux   . Aortic regurgitation   . Borderline anemia   . 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 Hearing loss    wears hearing aids  . HLD (hyperlipidemia)   . Hypertension   . Prostate cancer ([place])   . Wears glasses     Patient Active Problem List   Diagnosis Date Noted  . Acute pyelonephritis 09/22/2020  . Pyelonephritis 09/22/2020  . Myelopathy ([place]) 11/09/2019    Past Surgical History:  Procedure Laterality Date  . ANTERIOR CERVICAL DECOMP/DISCECTOMY FUSION N/A 11/09/2019   Procedure: ANTERIOR CERVICAL DECOMPRESSION FUSION CERVICAL 3-4 WITH INSTRUMENTATION AND ALLOGRAFT;  Surgeon: Phylliss Bob, MD;   Location: [facility];  Service: Orthopedics;  Laterality: N/A;  . CARDIAC CATHETERIZATION    . COLONOSCOPY  2020  . cyst removed     back- benign  . PROSTATECTOMY  2004  . UPPER GI ENDOSCOPY  2021       Home Medications    Prior to Admission medications   Medication Sig Start Date End Date Taking? Authorizing Provider  acetaminophen (TYLENOL) 500 MG tablet Take 1,000 mg by mouth every 8 (eight) hours as needed for moderate pain.    [provider]  aluminum hydroxide-magnesium carbonate (GAVISCON) 95-358 MG/15ML SUSP Take 30 mLs by mouth as needed for indigestion or heartburn.    [provider]  ascorbic acid (VITAMIN C) 500 MG tablet Take 500 mg by mouth daily.    [provider]  aspirin 81 MG EC tablet Take 81 mg by mouth daily.    [provider]  atorvastatin (LIPITOR) 40 MG tablet Take 40 mg by mouth at bedtime.    [provider]  azelastine (ASTELIN) 0.1 % nasal spray PLACE 2 SPRAYS INTO BOTH NOSTRILS 2 (TWO) TIMES DAILY 08/19/20   Valentina Shaggy, MD  b complex vitamins tablet Take 1 tablet by mouth daily.    [provider]  cephALEXin (KEFLEX) 500 MG capsule Take 1 capsule (500 mg total) by mouth 3 (three) times daily for 10 days. 09/25/20 10/05/20  Mercy Riding, MD  cholecalciferol (VITAMIN D3) 25 MCG (1000 UNIT) tablet Take 1,000 Units  by mouth daily.    [provider]  ECHINACEA EXTRACT PO Take 1 capsule by mouth daily as needed (cold symptoms).    [provider]  Magnesium 250 MG TABS Take 250 mg by mouth daily.    [provider]  Melatonin 5 MG CAPS Take 10 mg by mouth at bedtime.    [provider]  nebivolol (BYSTOLIC) 10 MG tablet TAKE 1 TABLET (10 MG TOTAL) BY MOUTH IN THE MORNING. Patient taking differently: Take 10 mg by mouth daily. 08/19/20 11/17/20  Tolia, Sunit, DO  Nutritional Supplements (JUICE PLUS FIBRE PO) Take 2 capsules by mouth daily.    [provider]   Probiotic Product (PROBIOTIC DAILY PO) Take 1 capsule by mouth daily.    [provider]  valsartan-hydrochlorothiazide (DIOVAN-HCT) 320-12.5 MG tablet Take 1 tablet by mouth daily. 02/21/20   [provider]  zinc gluconate 50 MG tablet Take 50 mg by mouth daily as needed (supplement).    [provider]    Family History Family History  Problem Relation Age of Onset  . Pancreatic cancer Mother   . Prostate cancer Father     Social History Social History   Tobacco Use  . Smoking status: Former Smoker    Types: Cigarettes    Quit date: 1974    Years since quitting: 48.4  . Smokeless tobacco: Never Used  Vaping Use  . Vaping Use: Never used  Substance Use Topics  . Alcohol use: Never  . Drug use: Never     Allergies   Patient has no known allergies.   Review of Systems Review of Systems  Constitutional: Negative for activity change, appetite change, fatigue and fever.  Respiratory: Negative for cough and shortness of breath.   Cardiovascular: Negative for chest pain.  Gastrointestinal: Positive for abdominal pain. Negative for diarrhea, nausea and vomiting.  Genitourinary: Positive for frequency. Negative for dysuria, flank pain, hematuria and urgency.  Musculoskeletal: Positive for back pain. Negative for arthralgias and myalgias.  Neurological: Negative for dizziness, light-headedness and headaches.     Physical Exam Triage Vital Signs ED Triage Vitals  Enc Vitals Group     BP 10/05/20 1450 (!) 152/81     Pulse Rate 10/05/20 1450 87     Resp 10/05/20 1450 18     Temp 10/05/20 1450 98.9 F (37.2 C)     Temp Source 10/05/20 1450 Oral     SpO2 10/05/20 1450 97 %     Weight --      Height --      Head Circumference --      Peak Flow --      Pain Score 10/05/20 1452 5     Pain Loc --      Pain Edu? --      Excl. in [place]? --    No data found.  Updated Vital Signs BP (!) 152/81 (BP Location: Left Arm)   Pulse 87   Temp 98.9 F  (37.2 C) (Oral)   Resp 18   SpO2 97%   Visual Acuity Right Eye Distance:   Left Eye Distance:   Bilateral Distance:    Right Eye Near:   Left Eye Near:    Bilateral Near:     Physical Exam Vitals reviewed.  Constitutional:      General: He is awake.     Appearance: Normal appearance. He is normal weight. He is not ill-appearing.     Comments: Very pleasant male appears  stated age in no acute distress sitting comfortably in exam room  HENT:     Head: Normocephalic and atraumatic.  Cardiovascular:     Rate and Rhythm: Normal rate and regular rhythm.     Heart sounds: Normal heart sounds. No murmur heard.   Pulmonary:     Effort: Pulmonary effort is normal.     Breath sounds: Normal breath sounds. No stridor. No wheezing, rhonchi or rales.     Comments: Clear to auscultation bilaterally Abdominal:     General: Bowel sounds are normal.     Palpations: Abdomen is soft.     Tenderness: There is no abdominal tenderness. There is no right CVA tenderness, left CVA tenderness, guarding or rebound.     Comments: Benign abdominal exam; no tenderness palpation.  No CVA tenderness.  Musculoskeletal:     Cervical back: Normal range of motion and neck supple. No tenderness or bony tenderness.     Thoracic back: No tenderness or bony tenderness.     Lumbar back: No tenderness or bony tenderness.     Comments: No pain percussion of vertebrae.  No tenderness palpation of paraspinal muscles.  Normal active range of motion.  Neurological:     Mental Status: He is alert.  Psychiatric:        Behavior: Behavior is cooperative.      UC Treatments / Results  Labs (all labs ordered are listed, but only abnormal results are displayed) Labs Reviewed  URINE CULTURE  POCT URINALYSIS DIPSTICK, ED / UC    EKG   Radiology No results found.  Procedures Procedures (including critical care time)  Medications Ordered in UC Medications - No data to display  Initial Impression /  Assessment and Plan / UC Course  I have reviewed the triage vital signs and the nursing notes.  Pertinent labs & imaging results that were available during my care of the patient were reviewed by me and considered in my medical decision making (see chart for details).     UA showed no evidence of infection.  We will obtain urine culture but no indication for initiation of antibiotics today.  Vital signs and physical exam are reassuring with no indication for emergent evaluation or imaging.  Suspect muscle strain as etiology of symptoms related to increase activity level.  Patient was encouraged to use conservative treatment measures including Tylenol, rest, heat for symptom relief.  He has follow-up scheduled with PCP and urology next week and was strongly encouraged to keep these appointments.  Patient reports he is already seeing PCP since discharge from the hospital and had lab work updated.  Discussed alarm symptoms that warrant emergent evaluation.  Strict return precautions given to which patient expressed understanding.  Final Clinical Impressions(s) / UC Diagnoses   Final diagnoses:  Acute left-sided low back pain without sciatica  History of pyelonephritis  Urinary frequency     Discharge Instructions     Your urine was normal.  We will send this off for culture to ensure there is not an infection but there was no indication to start antibiotics based on this testing today.  I suspect you strained a muscle.  Please use heat and stretch.  I would also recommend Tylenol over-the-counter.  Keep follow-up appointments with your primary care doctor and urologist next week.  If anything worsens please return for reevaluation.    ED Prescriptions    None     PDMP not reviewed this encounter.   Lamari Youngers, Derry Skill, PA-C  10/05/20 1607  

## 2020-10-08 LAB — URINE CULTURE: Culture: 10000 — AB

## 2020-10-09 ENCOUNTER — Telehealth (HOSPITAL_COMMUNITY): Payer: Self-pay | Admitting: Emergency Medicine

## 2020-10-09 MED ORDER — CIPROFLOXACIN HCL 250 MG PO TABS: 250.0000 mg | ORAL_TABLET | Freq: Two times a day (BID) | ORAL | 0 refills | Status: AC

## 2020-10-10 ENCOUNTER — Telehealth: Payer: Self-pay

## 2020-10-10 NOTE — Telephone Encounter (Signed)
Patient will first go see his PCP, and then if he is having any worsening symptoms, then he will schedule an appointment to see his cardiologists.

## 2020-10-10 NOTE — Telephone Encounter (Signed)
Patient had a kidney infection and was just discharged from the hospital. Patient stated that he is extremely fatigued and exhausted and short of breath after his stay in the hospital. Will he need to schedule an appointment with you or with his PCP. Please advise.

## 2020-10-10 NOTE — Telephone Encounter (Signed)
Please refer him to PCP to make sure his kidney infection has not come back.

## 2020-10-16 ENCOUNTER — Other Ambulatory Visit: Payer: Self-pay | Admitting: Cardiology

## 2020-10-16 DIAGNOSIS — R5383 Other fatigue: Secondary | ICD-10-CM

## 2020-10-16 DIAGNOSIS — I10 Essential (primary) hypertension: Secondary | ICD-10-CM

## 2020-10-29 ENCOUNTER — Ambulatory Visit: Payer: Medicare PPO | Admitting: Allergy & Immunology

## 2020-10-29 ENCOUNTER — Encounter: Payer: Self-pay | Admitting: Allergy & Immunology

## 2020-10-29 ENCOUNTER — Other Ambulatory Visit: Payer: Self-pay

## 2020-10-29 VITALS — BP 128/76 | HR 83 | Temp 98.1°F | Resp 18 | Ht 74.0 in | Wt 181.2 lb

## 2020-10-29 DIAGNOSIS — J3089 Other allergic rhinitis: Secondary | ICD-10-CM | POA: Diagnosis not present

## 2020-10-29 MED ORDER — DESLORATADINE 5 MG PO TABS: ORAL_TABLET | ORAL | 5 refills | Status: DC

## 2020-10-29 MED ORDER — AZELASTINE HCL 0.1 % NA SOLN: 2.0000 | Freq: Two times a day (BID) | NASAL | 5 refills | Status: DC

## 2020-10-29 NOTE — Patient Instructions (Addendum)
1. Chronic rhinitis (dust mites, indoor molds) - Continue with azelastine 1-2 sprays per nostril up to twice daily AS NEEDED. - We can restart the Clarinex as needed if you have breakthrough symptoms again.  - I do not think that need to do anything more aggressive.   2. Return in about 1 year (around 10/29/2021).    Please inform us of any Emergency Department visits, hospitalizations, or changes in symptoms. Call us before going to the ED for breathing or allergy symptoms since we might be able to fit you in for a sick visit. Feel free to contact us anytime with any questions, problems, or concerns.  It was a pleasure to see you again today!  Websites that have reliable patient information: 1. American Academy of Asthma, Allergy, and Immunology: www.aaaai.org 2. Food Allergy Research and Education (FARE): foodallergy.org 3. Mothers of Asthmatics: http://www.asthmacommunitynetwork.org 4. American College of Allergy, Asthma, and Immunology: www.acaai.org   COVID-19 Vaccine Information can be found at: ShippingScam.co.uk For questions related to vaccine distribution or appointments, please email [email or web address] or call [phone].   We realize that you might be concerned about having an allergic reaction to the COVID19 vaccines. To help with that concern, WE ARE OFFERING THE COVID19 VACCINES IN OUR OFFICE! Ask the front desk for dates!     "Like" [place] on Facebook and Instagram for our latest updates!      A healthy democracy works best when [employer] participate! Make sure you are registered to vote! If you have moved or changed any of your contact information, you will need to get this updated before voting!  In some cases, you MAY be able to register to vote online: [email or web address]

## 2020-10-29 NOTE — Progress Notes (Signed)
FOLLOW UP  Date of Service/Encounter:  10/29/20   Assessment:   Chronic rhinitis (dust mites, indoor molds)   Itching - improved  Plan/Recommendations:   1. Chronic rhinitis (dust mites, indoor molds) - Continue with azelastine 1-2 sprays per nostril up to twice daily AS NEEDED. - We can restart the Clarinex as needed if you have breakthrough symptoms again.  - I do not think that need to do anything more aggressive.   2. Return in about 1 year (around 10/29/2021).    Subjective:   Kenneth Daniel is a 75 y.o. male presenting today for follow up of  Chief Complaint  Patient presents with   Allergic Rhinitis     Has been doing good makes sure to use the nasal spray and it helps.    Other    He is take [place] for  4 weeks only for swelling in his left leg due to amlodipine [     Diaz Crago has a history of the following: Patient Active Problem List   Diagnosis Date Noted   Acute pyelonephritis 09/22/2020   Pyelonephritis 09/22/2020   Myelopathy ([place]) 11/09/2019    History obtained from: chart review and patient.  Kenneth Daniel is a 75 y.o. male presenting for a follow up visit.  He was last seen for skin testing in March 2022.  At that time, he was positive to dust mites and mold.  He was continued on Clarinex once daily as needed as well as Flonase 2 sprays per nostril daily.  In the interim, he was admitted with pyelonephritis.  He remained in the hospital for 3 days.  He was on IV ceftriaxone for 3 days and discharged on Keflex.  Urine studies remain negative.  Her symptoms are under good control with current regimen.  He is using only the nasal spray and only a couple of times per week.  I am unclear whether this is Flonase or Astelin, but what ever it is is controlling his symptoms.  He does not use a daily antihistamine.  He is not doing any traveling this summer.  Otherwise, there have been no changes to his past medical history, surgical history, family  history, or social history.    Review of Systems  Constitutional: Negative.  Negative for fever, malaise/fatigue and weight loss.  HENT: Negative.  Negative for congestion, ear discharge and ear pain.   Eyes:  Negative for pain, discharge and redness.  Respiratory:  Negative for cough, sputum production, shortness of breath and wheezing.   Cardiovascular: Negative.  Negative for chest pain and palpitations.  Gastrointestinal:  Negative for abdominal pain and heartburn.  Skin: Negative.  Negative for itching and rash.  Neurological:  Negative for dizziness and headaches.  Endo/Heme/Allergies:  Negative for environmental allergies. Does not bruise/bleed easily.      Objective:   Blood pressure 128/76, pulse 83, temperature 98.1 F (36.7 C), resp. rate 18, height 6\' 2"  (1.88 m), weight 181 lb 3.2 oz (82.2 kg), SpO2 96 %. Body mass index is 23.26 kg/m.   Physical Exam:  Physical Exam Constitutional:      Appearance: He is well-developed.  HENT:     Head: Normocephalic and atraumatic.     Right Ear: External ear normal.     Left Ear: External ear normal.     Ears:     Comments: Hearing aids in place bilaterally.    Nose: No nasal deformity, septal deviation, mucosal edema or rhinorrhea.     Right Turbinates:  Enlarged and swollen.     Left Turbinates: Enlarged and swollen.     Right Sinus: No maxillary sinus tenderness or frontal sinus tenderness.     Left Sinus: No maxillary sinus tenderness or frontal sinus tenderness.     Comments: Turbinates are erythematous.    Mouth/Throat:     Mouth: Mucous membranes are not pale and not dry.     Pharynx: Uvula midline.  Eyes:     General: Lids are normal. No allergic shiner.       Right eye: No discharge.        Left eye: No discharge.     Conjunctiva/sclera: Conjunctivae normal.     Right eye: Right conjunctiva is not injected. No chemosis.    Left eye: Left conjunctiva is not injected. No chemosis.    Pupils: Pupils are equal,  round, and reactive to light.  Cardiovascular:     Rate and Rhythm: Normal rate and regular rhythm.     Heart sounds: Normal heart sounds.  Pulmonary:     Effort: Pulmonary effort is normal. No tachypnea, accessory muscle usage or respiratory distress.     Breath sounds: Normal breath sounds. No wheezing, rhonchi or rales.  Chest:     Chest wall: No tenderness.  Lymphadenopathy:     Cervical: No cervical adenopathy.  Skin:    General: Skin is warm.     Capillary Refill: Capillary refill takes less than 2 seconds.     Coloration: Skin is not pale.     Findings: No abrasion, erythema, petechiae or rash. Rash is not papular, urticarial or vesicular.     Comments: No eczematous or urticarial lesions noted.  Neurological:     Mental Status: He is alert.  Psychiatric:        Behavior: Behavior is cooperative.     Diagnostic studies: none        Salvatore Marvel, MD  [facility]

## 2020-11-02 ENCOUNTER — Other Ambulatory Visit: Payer: Self-pay

## 2020-11-02 ENCOUNTER — Encounter (HOSPITAL_COMMUNITY): Payer: Self-pay | Admitting: *Deleted

## 2020-11-02 ENCOUNTER — Ambulatory Visit (HOSPITAL_COMMUNITY)
Admission: EM | Admit: 2020-11-02 | Discharge: 2020-11-02 | Disposition: A | Discharge status: Home / Self Care | Payer: Medicare PPO | Attending: Family Medicine | Admitting: Family Medicine

## 2020-11-02 DIAGNOSIS — R410 Disorientation, unspecified: Secondary | ICD-10-CM | POA: Diagnosis present

## 2020-11-02 DIAGNOSIS — R682 Dry mouth, unspecified: Secondary | ICD-10-CM | POA: Insufficient documentation

## 2020-11-02 DIAGNOSIS — R400 Somnolence: Secondary | ICD-10-CM | POA: Insufficient documentation

## 2020-11-02 LAB — CBC WITH DIFFERENTIAL/PLATELET
Abs Immature Granulocytes: 0.02 10*3/uL (ref 0.00–0.07)
Basophils Absolute: 0 10*3/uL (ref 0.0–0.1)
Basophils Relative: 0 %
Eosinophils Absolute: 0.1 10*3/uL (ref 0.0–0.5)
Eosinophils Relative: 2 %
HCT: 35 % — ABNORMAL LOW (ref 39.0–52.0)
Hemoglobin: 11.2 g/dL — ABNORMAL LOW (ref 13.0–17.0)
Immature Granulocytes: 0 %
Lymphocytes Relative: 14 %
Lymphs Abs: 0.7 10*3/uL (ref 0.7–4.0)
MCH: 26.6 pg (ref 26.0–34.0)
MCHC: 32 g/dL (ref 30.0–36.0)
MCV: 83.1 fL (ref 80.0–100.0)
Monocytes Absolute: 0.7 10*3/uL (ref 0.1–1.0)
Monocytes Relative: 13 %
Neutro Abs: 3.6 10*3/uL (ref 1.7–7.7)
Neutrophils Relative %: 71 %
Platelets: 330 10*3/uL (ref 150–400)
RBC: 4.21 MIL/uL — ABNORMAL LOW (ref 4.22–5.81)
RDW: 15.7 % — ABNORMAL HIGH (ref 11.5–15.5)
WBC: 5.1 10*3/uL (ref 4.0–10.5)
nRBC: 0 % (ref 0.0–0.2)

## 2020-11-02 LAB — COMPREHENSIVE METABOLIC PANEL
ALT: 23 U/L (ref 0–44)
AST: 19 U/L (ref 15–41)
Albumin: 3.2 g/dL — ABNORMAL LOW (ref 3.5–5.0)
Alkaline Phosphatase: 79 U/L (ref 38–126)
Anion gap: 6 (ref 5–15)
BUN: 20 mg/dL (ref 8–23)
CO2: 30 mmol/L (ref 22–32)
Calcium: 9.6 mg/dL (ref 8.9–10.3)
Chloride: 101 mmol/L (ref 98–111)
Creatinine, Ser: 1.18 mg/dL (ref 0.61–1.24)
GFR, Estimated: >60 mL/min (ref >60)
Glucose, Bld: 99 mg/dL (ref 70–99)
Potassium: 4.6 mmol/L (ref 3.5–5.1)
Sodium: 137 mmol/L (ref 135–145)
Total Bilirubin: 0.5 mg/dL (ref 0.3–1.2)
Total Protein: 8.6 g/dL — ABNORMAL HIGH (ref 6.5–8.1)

## 2020-11-02 LAB — POCT URINALYSIS DIPSTICK, ED / UC
Bilirubin Urine: NEGATIVE
Glucose, UA: >=1000 mg/dL — AB
Hgb urine dipstick: NEGATIVE
Ketones, ur: NEGATIVE mg/dL
Leukocytes,Ua: NEGATIVE
Nitrite: NEGATIVE
Protein, ur: NEGATIVE mg/dL
Specific Gravity, Urine: <=1.005 (ref 1.005–1.030)
Urobilinogen, UA: 0.2 mg/dL (ref 0.0–1.0)
pH: 5.5 (ref 5.0–8.0)

## 2020-11-02 LAB — CBG MONITORING, ED: Glucose-Capillary: 123 mg/dL — ABNORMAL HIGH (ref 70–99)

## 2020-11-02 NOTE — ED Triage Notes (Signed)
PT started on new medication ( Farxiga ) Pt reports he feels foggy in mornings . Pt did call PCP and reported he felt dry and foggy . PCP reported for Pt was to ride out the Sx's. Pt here for recheck.

## 2020-11-02 NOTE — ED Provider Notes (Signed)
[place]    CSN: [id number] Arrival date & time: 11/02/20  1331      History   Chief Complaint Chief Complaint  Patient presents with   side effect of new med    HPI Kenneth Daniel is a 75 y.o. male.   Patient presenting today with 4 to 5-day history of waking in the middle of the night feeling disoriented, foggy, dehydrated/dry mouth.  States he has never had the symptoms in the past until he started [place] which his PCP gave him for leg swelling that was thought to be caused by his amlodipine.  He is holding his amlodipine per PCP request due to this and started the [place] which he feels was helping with his leg swelling but may be having tolerance issues.  He called PCP to discuss the symptoms with them and they requested he continue the course and follow-up on 11/13/2020 as scheduled for a recheck.  He states symptoms do tend to improve after he gets up and moving and eats breakfast, and drink something.  Denies chest pain, shortness of breath, visual changes, abdominal pain, nausea vomiting or diarrhea, dysuria, hematuria, syncope, fever.  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, right    Hearing loss    wears hearing aids   HLD (hyperlipidemia)    Hypertension    Prostate cancer ([place])    Wears glasses     Patient Active Problem List   Diagnosis Date Noted   Acute pyelonephritis 09/22/2020   Pyelonephritis 09/22/2020   Myelopathy ([place]) 11/09/2019    Past Surgical History:  Procedure Laterality Date   ANTERIOR CERVICAL DECOMP/DISCECTOMY FUSION N/A 11/09/2019   Procedure: ANTERIOR CERVICAL DECOMPRESSION FUSION CERVICAL 3-4 WITH INSTRUMENTATION AND ALLOGRAFT;  Surgeon: Phylliss Bob, MD;  Location: [facility];  Service: Orthopedics;  Laterality: N/A;   CARDIAC CATHETERIZATION     COLONOSCOPY  2020   cyst removed     back- benign   PROSTATECTOMY   2004   UPPER GI ENDOSCOPY  2021     Home Medications    Prior to Admission medications   Medication Sig Start Date End Date Taking? Authorizing Provider  acetaminophen (TYLENOL) 500 MG tablet Take 1,000 mg by mouth every 8 (eight) hours as needed for moderate pain.    [provider]  aluminum hydroxide-magnesium carbonate (GAVISCON) 95-358 MG/15ML SUSP Take 30 mLs by mouth as needed for indigestion or heartburn.    [provider]  ascorbic acid (VITAMIN C) 500 MG tablet Take 500 mg by mouth daily.    [provider]  aspirin 81 MG EC tablet Take 81 mg by mouth daily.    [provider]  atorvastatin (LIPITOR) 40 MG tablet Take 40 mg by mouth at bedtime.    [provider]  azelastine (ASTELIN) 0.1 % nasal spray Place 2 sprays into both nostrils 2 (two) times daily. 10/29/20   Valentina Shaggy, MD  b complex vitamins tablet Take 1 tablet by mouth daily.    [provider]  cholecalciferol (VITAMIN D3) 25 MCG (1000 UNIT) tablet Take 1,000 Units by mouth daily.    [provider]  desloratadine (CLARINEX) 5 MG tablet once a day as needed for runny nose or itch 10/29/20   Valentina Shaggy, MD  [facility] EXTRACT PO Take 1 capsule by mouth daily as needed (cold symptoms).  [provider]  Magnesium 250 MG TABS Take 250 mg by mouth daily.    [provider]  Melatonin 5 MG CAPS Take 10 mg by mouth at bedtime.    [provider]  nebivolol (BYSTOLIC) 10 MG tablet TAKE 1 TABLET (10 MG TOTAL) BY MOUTH IN THE MORNING. 10/17/20 01/15/21  Tolia, Sunit, DO  Nutritional Supplements (JUICE PLUS FIBRE PO) Take 2 capsules by mouth daily.    [provider]  Probiotic Product (PROBIOTIC DAILY PO) Take 1 capsule by mouth daily.    [provider]  valsartan-hydrochlorothiazide (DIOVAN-HCT) 320-12.5 MG tablet Take 1 tablet by mouth daily. 02/21/20   [provider]  zinc gluconate 50 MG  tablet Take 50 mg by mouth daily as needed (supplement).    [provider]    Family History Family History  Problem Relation Age of Onset   Pancreatic cancer Mother    Prostate cancer Father     Social History Social History   Tobacco Use   Smoking status: Former    Pack years: 0.00    Types: Cigarettes    Quit date: 1974    Years since quitting: 48.5   Smokeless tobacco: Never  Vaping Use   Vaping Use: Never used  Substance Use Topics   Alcohol use: Never   Drug use: Never     Allergies   Patient has no known allergies.   Review of Systems Review of Systems Per HPI  Physical Exam Triage Vital Signs ED Triage Vitals  Enc Vitals Group     BP 11/02/20 1342 (!) 156/87     Pulse Rate 11/02/20 1342 93     Resp --      Temp 11/02/20 1342 99.1 F (37.3 C)     Temp src --      SpO2 11/02/20 1342 98 %     Weight --      Height --      Head Circumference --      Peak Flow --      Pain Score 11/02/20 1339 0     Pain Loc --      Pain Edu? --      Excl. in [place]? --    No data found.  Updated Vital Signs BP (!) 156/87   Pulse 93   Temp 99.1 F (37.3 C)   SpO2 98%   Visual Acuity Right Eye Distance:   Left Eye Distance:   Bilateral Distance:    Right Eye Near:   Left Eye Near:    Bilateral Near:     Physical Exam Vitals and nursing note reviewed.  Constitutional:      Appearance: Normal appearance.  HENT:     Head: Atraumatic.     Nose: Nose normal.     Mouth/Throat:     Mouth: Mucous membranes are moist.     Pharynx: Oropharynx is clear.  Eyes:     Extraocular Movements: Extraocular movements intact.     Conjunctiva/sclera: Conjunctivae normal.  Cardiovascular:     Rate and Rhythm: Normal rate and regular rhythm.     Heart sounds: Normal heart sounds.  Pulmonary:     Effort: Pulmonary effort is normal.     Breath sounds: Normal breath sounds.  Abdominal:     General: Bowel sounds are normal. There is no distension.      Palpations: Abdomen is soft.     Tenderness: There is no abdominal tenderness. There is no guarding.  Musculoskeletal:        General: Normal range of motion.     Cervical back: Normal range of motion and neck supple.     Left lower leg: Edema present.     Comments: 1+ edema left lower extremity which he states this is baseline  Skin:    General: Skin is warm and dry.  Neurological:     General: No focal deficit present.     Mental Status: He is oriented to person, place, and time.     Cranial Nerves: No cranial nerve deficit.     Motor: No weakness.  Psychiatric:        Mood and Affect: Mood normal.        Thought Content: Thought content normal.        Judgment: Judgment normal.     UC Treatments / Results  Labs (all labs ordered are listed, but only abnormal results are displayed) Labs Reviewed  CBG MONITORING, ED - Abnormal; Notable for the following components:      Result Value   Glucose-Capillary 123 (*)    All other components within normal limits  POCT URINALYSIS DIPSTICK, ED / UC - Abnormal; Notable for the following components:   Glucose, UA >=1000 (*)    All other components within normal limits  CBC WITH DIFFERENTIAL/PLATELET  COMPREHENSIVE METABOLIC PANEL    EKG   Radiology No results found.  Procedures Procedures (including critical care time)  Medications Ordered in UC Medications - No data to display  Initial Impression / Assessment and Plan / UC Course  I have reviewed the triage vital signs and the nursing notes.  Pertinent labs & imaging results that were available during my care of the patient were reviewed by me and considered in my medical decision making (see chart for details).     Mildly hypertensive in triage likely secondary to DC of amlodipine due to lower extremity edema suspected to be caused by this.  Suspect his symptoms largely related to the addition of [place] which may be dehydrating him and or dropping his blood sugar too  low overnight.  UA without evidence of urinary tract infection or significant dehydration at this time, point-of-care CBG 123 postprandial but he has no way of checking it at home during symptoms.  EKG without acute abnormality including ST elevations.  CBC and CMP pending for further rule out.  Discussed DC of the [place] and close monitoring to see if his symptoms resolve.  Close follow-up with PCP first thing next week recommended for further management.  ED for acutely worsening symptoms at any time.  Final Clinical Impressions(s) / UC Diagnoses   Final diagnoses:  Disorientation  Drowsiness  Dry mouth     Discharge Instructions      I suspect that your symptoms are related to the new medication, Farxiga.  Please stop taking this medication and see if this resolves her symptoms.  Call your primary care provider first thing Tuesday morning and discuss next steps with them.  Make sure to stay well-hydrated and follow-up immediately if your symptoms worsen.     ED Prescriptions   None    PDMP not reviewed this encounter.   Volney American, [place] 11/02/20 [phone]

## 2020-11-02 NOTE — Discharge Instructions (Addendum)
I suspect that your symptoms are related to the new medication, Farxiga.  Please stop taking this medication and see if this resolves her symptoms.  Call your primary care provider first thing Tuesday morning and discuss next steps with them.  Make sure to stay well-hydrated and follow-up immediately if your symptoms worsen.

## 2020-11-18 ENCOUNTER — Other Ambulatory Visit: Payer: Self-pay

## 2020-11-18 ENCOUNTER — Emergency Department (HOSPITAL_COMMUNITY): Payer: Medicare PPO

## 2020-11-18 ENCOUNTER — Encounter (HOSPITAL_COMMUNITY): Payer: Self-pay | Admitting: Emergency Medicine

## 2020-11-18 ENCOUNTER — Emergency Department (HOSPITAL_COMMUNITY)
Admission: EM | Admit: 2020-11-18 | Discharge: 2020-11-18 | Disposition: A | Discharge status: Home / Self Care | Payer: Medicare PPO | Attending: Emergency Medicine | Admitting: Emergency Medicine

## 2020-11-18 DIAGNOSIS — Z79899 Other long term (current) drug therapy: Secondary | ICD-10-CM | POA: Diagnosis not present

## 2020-11-18 DIAGNOSIS — I10 Essential (primary) hypertension: Secondary | ICD-10-CM | POA: Diagnosis not present

## 2020-11-18 DIAGNOSIS — R509 Fever, unspecified: Secondary | ICD-10-CM | POA: Diagnosis present

## 2020-11-18 DIAGNOSIS — Z8546 Personal history of malignant neoplasm of prostate: Secondary | ICD-10-CM | POA: Insufficient documentation

## 2020-11-18 DIAGNOSIS — U071 COVID-19: Secondary | ICD-10-CM | POA: Insufficient documentation

## 2020-11-18 DIAGNOSIS — Z87891 Personal history of nicotine dependence: Secondary | ICD-10-CM | POA: Diagnosis not present

## 2020-11-18 DIAGNOSIS — Z7982 Long term (current) use of aspirin: Secondary | ICD-10-CM | POA: Diagnosis not present

## 2020-11-18 DIAGNOSIS — Z8616 Personal history of COVID-19: Secondary | ICD-10-CM

## 2020-11-18 HISTORY — DX: Personal history of COVID-19: Z86.16

## 2020-11-18 LAB — CBC WITH DIFFERENTIAL/PLATELET
Abs Immature Granulocytes: 0.01 10*3/uL (ref 0.00–0.07)
Basophils Absolute: 0 10*3/uL (ref 0.0–0.1)
Basophils Relative: 0 %
Eosinophils Absolute: 0 10*3/uL (ref 0.0–0.5)
Eosinophils Relative: 0 %
HCT: 35.1 % — ABNORMAL LOW (ref 39.0–52.0)
Hemoglobin: 11.3 g/dL — ABNORMAL LOW (ref 13.0–17.0)
Immature Granulocytes: 0 %
Lymphocytes Relative: 10 %
Lymphs Abs: 0.4 10*3/uL — ABNORMAL LOW (ref 0.7–4.0)
MCH: 27 pg (ref 26.0–34.0)
MCHC: 32.2 g/dL (ref 30.0–36.0)
MCV: 84 fL (ref 80.0–100.0)
Monocytes Absolute: 1 10*3/uL (ref 0.1–1.0)
Monocytes Relative: 27 %
Neutro Abs: 2.2 10*3/uL (ref 1.7–7.7)
Neutrophils Relative %: 63 %
Platelets: 223 10*3/uL (ref 150–400)
RBC: 4.18 MIL/uL — ABNORMAL LOW (ref 4.22–5.81)
RDW: 16 % — ABNORMAL HIGH (ref 11.5–15.5)
WBC: 3.6 10*3/uL — ABNORMAL LOW (ref 4.0–10.5)
nRBC: 0 % (ref 0.0–0.2)

## 2020-11-18 LAB — COMPREHENSIVE METABOLIC PANEL
ALT: 28 U/L (ref 0–44)
AST: 26 U/L (ref 15–41)
Albumin: 3.3 g/dL — ABNORMAL LOW (ref 3.5–5.0)
Alkaline Phosphatase: 74 U/L (ref 38–126)
Anion gap: 8 (ref 5–15)
BUN: 12 mg/dL (ref 8–23)
CO2: 26 mmol/L (ref 22–32)
Calcium: 9.1 mg/dL (ref 8.9–10.3)
Chloride: 100 mmol/L (ref 98–111)
Creatinine, Ser: 1.1 mg/dL (ref 0.61–1.24)
GFR, Estimated: >60 mL/min (ref >60)
Glucose, Bld: 107 mg/dL — ABNORMAL HIGH (ref 70–99)
Potassium: 3.6 mmol/L (ref 3.5–5.1)
Sodium: 134 mmol/L — ABNORMAL LOW (ref 135–145)
Total Bilirubin: 0.7 mg/dL (ref 0.3–1.2)
Total Protein: 7.9 g/dL (ref 6.5–8.1)

## 2020-11-18 LAB — LACTIC ACID, PLASMA: Lactic Acid, Venous: 0.8 mmol/L (ref 0.5–1.9)

## 2020-11-18 LAB — RESP PANEL BY RT-PCR (FLU A&B, COVID) ARPGX2
Influenza A by PCR: NEGATIVE
Influenza B by PCR: NEGATIVE
SARS Coronavirus 2 by RT PCR: POSITIVE — AB

## 2020-11-18 MED ORDER — SODIUM CHLORIDE 0.9 % IV BOLUS: 500.0000 mL | Freq: Once | INTRAVENOUS | Status: DC

## 2020-11-18 MED ORDER — NIRMATRELVIR/RITONAVIR (PAXLOVID)TABLET: 3.0000 | ORAL_TABLET | Freq: Two times a day (BID) | ORAL | 0 refills | Status: AC

## 2020-11-18 NOTE — ED Provider Notes (Signed)
[facility] EMERGENCY DEPARTMENT Provider Note   CSN: [id number] Arrival date & time: 11/18/20  0059     History Chief Complaint  Patient presents with   Fever    Kenneth Daniel is a 75 y.o. male.  Patient presents to the emergency department for evaluation of fever.  Patient reports that he thought he might have a sinus infection because he had some nasal congestion. He has noticed sore throat and has a slight headache as well.  He noticed a fever tonight, took some Tylenol before coming to the ER.  He has noticed a dull ache in his lower back and across the lower abdomen.  No associated nausea or vomiting.  No diarrhea or constipation.  Patient reports that the last time he had a fever he had a kidney infection.  He has not noticed any urinary symptoms tonight.      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, right    Hearing loss    wears hearing aids   HLD (hyperlipidemia)    Hypertension    Prostate cancer ([place])    Wears glasses     Patient Active Problem List   Diagnosis Date Noted   Acute pyelonephritis 09/22/2020   Pyelonephritis 09/22/2020   Myelopathy ([place]) 11/09/2019    Past Surgical History:  Procedure Laterality Date   ANTERIOR CERVICAL DECOMP/DISCECTOMY FUSION N/A 11/09/2019   Procedure: ANTERIOR CERVICAL DECOMPRESSION FUSION CERVICAL 3-4 WITH INSTRUMENTATION AND ALLOGRAFT;  Surgeon: Phylliss Bob, MD;  Location: [facility];  Service: Orthopedics;  Laterality: N/A;   CARDIAC CATHETERIZATION     COLONOSCOPY  2020   cyst removed     back- benign   PROSTATECTOMY  2004   UPPER GI ENDOSCOPY  2021       Family History  Problem Relation Age of Onset   Pancreatic cancer Mother    Prostate cancer Father     Social History   Tobacco Use   Smoking status: Former    Types: Cigarettes    Quit date: 1974    Years since quitting:  48.5   Smokeless tobacco: Never  Vaping Use   Vaping Use: Never used  Substance Use Topics   Alcohol use: Never   Drug use: Never    Home Medications Prior to Admission medications   Medication Sig Start Date End Date Taking? Authorizing Provider  nirmatrelvir/ritonavir EUA (PAXLOVID) TABS Take 3 tablets by mouth 2 (two) times daily for 5 days. Patient GFR is >60. Take nirmatrelvir (150 mg) two tablets twice daily for 5 days and ritonavir (100 mg) one tablet twice daily for 5 days. 11/18/20 11/23/20 Yes Lacresia Darwish, Gwenyth Allegra, MD  acetaminophen (TYLENOL) 500 MG tablet Take 1,000 mg by mouth every 8 (eight) hours as needed for moderate pain.    [provider]  aluminum hydroxide-magnesium carbonate (GAVISCON) 95-358 MG/15ML SUSP Take 30 mLs by mouth as needed for indigestion or heartburn.    [provider]  ascorbic acid (VITAMIN C) 500 MG tablet Take 500 mg by mouth daily.    [provider]  aspirin 81 MG EC tablet Take 81 mg by mouth daily.    [provider]  atorvastatin (LIPITOR) 40 MG tablet Take 40 mg by mouth at bedtime.    [provider]  azelastine (ASTELIN) 0.1 % nasal spray Place 2 sprays into both nostrils 2 (  two) times daily. 10/29/20   Valentina Shaggy, MD  b complex vitamins tablet Take 1 tablet by mouth daily.    [provider]  cholecalciferol (VITAMIN D3) 25 MCG (1000 UNIT) tablet Take 1,000 Units by mouth daily.    [provider]  desloratadine (CLARINEX) 5 MG tablet once a day as needed for runny nose or itch 10/29/20   Valentina Shaggy, MD  [facility] EXTRACT PO Take 1 capsule by mouth daily as needed (cold symptoms).    [provider]  Magnesium 250 MG TABS Take 250 mg by mouth daily.    [provider]  Melatonin 5 MG CAPS Take 10 mg by mouth at bedtime.    [provider]  nebivolol (BYSTOLIC) 10 MG tablet TAKE 1 TABLET (10 MG TOTAL) BY MOUTH IN THE MORNING. 10/17/20  01/15/21  Tolia, Sunit, DO  Nutritional Supplements (JUICE PLUS FIBRE PO) Take 2 capsules by mouth daily.    [provider]  Probiotic Product (PROBIOTIC DAILY PO) Take 1 capsule by mouth daily.    [provider]  valsartan-hydrochlorothiazide (DIOVAN-HCT) 320-12.5 MG tablet Take 1 tablet by mouth daily. 02/21/20   [provider]  zinc gluconate 50 MG tablet Take 50 mg by mouth daily as needed (supplement).    [provider]    Allergies    Patient has no known allergies.  Review of Systems   Review of Systems  Constitutional:  Positive for fever.  HENT:  Positive for congestion.   Gastrointestinal:  Positive for abdominal pain.  Musculoskeletal:  Positive for back pain.  Neurological:  Positive for headaches.  All other systems reviewed and are negative.  Physical Exam Updated Vital Signs BP (!) 145/94 (BP Location: Right Arm)   Pulse 85   Temp 99.7 F (37.6 C) (Oral)   Resp 17   SpO2 95%   Physical Exam Vitals and nursing note reviewed.  Constitutional:      General: He is not in acute distress.    Appearance: Normal appearance. He is well-developed.  HENT:     Head: Normocephalic and atraumatic.     Right Ear: Hearing normal.     Left Ear: Hearing normal.     Nose: Nose normal.  Eyes:     Conjunctiva/sclera: Conjunctivae normal.     Pupils: Pupils are equal, round, and reactive to light.  Cardiovascular:     Rate and Rhythm: Regular rhythm.     Heart sounds: S1 normal and S2 normal. No murmur heard.   No friction rub. No gallop.  Pulmonary:     Effort: Pulmonary effort is normal. No respiratory distress.     Breath sounds: Normal breath sounds.  Chest:     Chest wall: No tenderness.  Abdominal:     General: Bowel sounds are normal.     Palpations: Abdomen is soft.     Tenderness: There is generalized abdominal tenderness. There is no guarding or rebound. Negative signs include Murphy's sign and McBurney's sign.      Hernia: No hernia is present.  Musculoskeletal:        General: Normal range of motion.     Cervical back: Normal range of motion and neck supple.  Skin:    General: Skin is warm and dry.     Findings: No rash.  Neurological:     Mental Status: He is alert and oriented to person, place, and time.     GCS: GCS eye subscore is 4. GCS  verbal subscore is 5. GCS motor subscore is 6.     Cranial Nerves: No cranial nerve deficit.     Sensory: No sensory deficit.     Coordination: Coordination normal.  Psychiatric:        Speech: Speech normal.        Behavior: Behavior normal.        Thought Content: Thought content normal.    ED Results / Procedures / Treatments   Labs (all labs ordered are listed, but only abnormal results are displayed) Labs Reviewed  RESP PANEL BY RT-PCR (FLU A&B, COVID) ARPGX2 - Abnormal; Notable for the following components:      Result Value   SARS Coronavirus 2 by RT PCR POSITIVE (*)    All other components within normal limits  CBC WITH DIFFERENTIAL/PLATELET - Abnormal; Notable for the following components:   WBC 3.6 (*)    RBC 4.18 (*)    Hemoglobin 11.3 (*)    HCT 35.1 (*)    RDW 16.0 (*)    Lymphs Abs 0.4 (*)    All other components within normal limits  COMPREHENSIVE METABOLIC PANEL - Abnormal; Notable for the following components:   Sodium 134 (*)    Glucose, Bld 107 (*)    Albumin 3.3 (*)    All other components within normal limits  CULTURE, BLOOD (SINGLE)  URINE CULTURE  LACTIC ACID, PLASMA  URINALYSIS, ROUTINE W REFLEX MICROSCOPIC    EKG None  Radiology DG Chest 2 View  Result Date: 11/18/2020 CLINICAL DATA:  Fever EXAM: CHEST - 2 VIEW COMPARISON:  09/22/2000 FINDINGS: Lungs are clear.  No pleural effusion or pneumothorax. The heart is normal in size. Visualized osseous structures are within normal limits. IMPRESSION: Normal chest radiographs. Electronically Signed   By: Julian Hy M.D.   On: 11/18/2020 03:23     Procedures Procedures   Medications Ordered in ED Medications  sodium chloride 0.9 % bolus 500 mL (has no administration in time range)    ED Course  I have reviewed the triage vital signs and the nursing notes.  Pertinent labs & imaging results that were available during my care of the patient were reviewed by me and considered in my medical decision making (see chart for details).    MDM Rules/Calculators/A&P                          Patient presented to the emergency department for fever and URI symptoms.  Chest x-ray negative.  Blood work unremarkable.  COVID test is positive which does explain patient's symptoms.  He appears well.  No difficulty breathing.  Appropriate for discharge, outpatient treatment with antiviral regimen. Final Clinical Impression(s) / ED Diagnoses Final diagnoses:  [id number]    Rx / DC Orders ED Discharge Orders          Ordered    nirmatrelvir/ritonavir EUA (PAXLOVID) TABS  2 times daily        11/18/20 0729             Orpah Greek, MD 11/28/20 [id number]

## 2020-11-18 NOTE — ED Triage Notes (Signed)
Patient reports low grade fever this evening T= 100.8 , took 2 tabs Tylenol prior to arrival , denies chills , no cough , respirations unlabored/no urinary discomfort.

## 2020-11-19 LAB — BLOOD CULTURE ID PANEL (REFLEXED) - BCID2

## 2020-11-21 LAB — CULTURE, BLOOD (SINGLE)

## 2020-11-22 ENCOUNTER — Telehealth: Payer: Self-pay | Admitting: Emergency Medicine

## 2020-11-22 NOTE — Telephone Encounter (Signed)
Post ED Visit - Positive Culture Follow-up: Successful Patient Follow-Up  Culture assessed and recommendations reviewed by:  '[]'$  Elenor Quinones, Pharm.D. '[]'$  Heide Guile, Pharm.D., BCPS AQ-ID '[]'$  Parks Neptune, Pharm.D., BCPS '[]'$  Alycia Rossetti, Pharm.D., BCPS '[]'$  [place], [place].D., BCPS, AAHIVP '[]'$  Legrand Como, Pharm.D., BCPS, AAHIVP '[]'$  Salome Arnt, PharmD, BCPS '[]'$  Johnnette Gourd, PharmD, BCPS '[]'$  Hughes Better, PharmD, BCPS '[x]'$  Lorelei Pont, PharmD  Positive Blood culture  '[]'$  Patient discharged without antimicrobial prescription and treatment is now indicated '[]'$  Organism is resistant to prescribed ED discharge antimicrobial '[]'$  Patient with positive blood cultures  Changes discussed with ED provider: Pattricia Boss, MD  Plan: Likely contaminant, Well-check and return precautions, If s/s for bacteremia present, needs evaluation  Contacted patient, date 11/22/20, time 1244 Patient reports feeling better. Instructed to return if feeling worse or s/s of bacteremia, patient verbalized understanding.   Milus Mallick 11/22/2020, 2:31 PM

## 2020-11-22 NOTE — Progress Notes (Signed)
ED Antimicrobial Stewardship Positive Culture Follow Up   Kenneth Daniel is an 75 y.o. male who presented to [facility] on 11/18/2020 with a chief complaint of  Chief Complaint  Patient presents with   Fever    Recent Results (from the past 720 hour(s))  Blood culture (routine single)     Status: Abnormal   Collection Time: 11/18/20  5:00 AM   Specimen: BLOOD  Result Value Ref Range Status   Specimen Description BLOOD RIGHT ANTECUBITAL  Final   Special Requests   Final    BOTTLES DRAWN AEROBIC AND ANAEROBIC Blood Culture results may not be optimal due to an excessive volume of blood received in culture bottles   Culture  Setup Time   Final    GRAM POSITIVE COCCI IN CLUSTERS ANAEROBIC BOTTLE ONLY CRITICAL RESULT CALLED TO, READ BACK BY AND VERIFIED WITH: L. ADKINS RN, AT [id number] 11/19/20 D. VANHOOK    Culture (A)  Final    STAPHYLOCOCCUS CAPITIS THE SIGNIFICANCE OF ISOLATING THIS ORGANISM FROM A SINGLE VENIPUNCTURE CANNOT BE PREDICTED WITHOUT FURTHER CLINICAL AND CULTURE CORRELATION. SUSCEPTIBILITIES AVAILABLE ONLY ON REQUEST. Performed at [facility], [place] [address] [place], [place] [address]    Report Status 11/21/2020 FINAL  Final  Resp Panel by RT-PCR (Flu A&B, Covid) Nasopharyngeal Swab     Status: Abnormal   Collection Time: 11/18/20  5:00 AM   Specimen: Nasopharyngeal Swab; Nasopharyngeal(NP) swabs in vial transport medium  Result Value Ref Range Status   SARS Coronavirus 2 by RT PCR POSITIVE (A) NEGATIVE Final    Comment: RESULT CALLED TO, READ BACK BY AND VERIFIED WITH: RN D PIRNTZAS [id number] AT 721 AM BY CM (NOTE) SARS-CoV-2 target nucleic acids are DETECTED.  The SARS-CoV-2 RNA is generally detectable in upper respiratory specimens during the acute phase of infection. Positive results are indicative of the presence of the identified virus, but do not rule out bacterial infection or co-infection with other pathogens not detected by the test. Clinical correlation  with patient history and other diagnostic information is necessary to determine patient infection status. The expected result is Negative.  Fact Sheet for Patients: EntrepreneurPulse.com.au  Fact Sheet for Healthcare Providers: IncredibleEmployment.be  This test is not yet approved or cleared by the [place] FDA and  has been authorized for detection and/or diagnosis of SARS-CoV-2 by FDA under an Emergency Use Authorization (EUA).  This EUA will remain in effect (meaning this test can  be used) for the duration of  the COVID-19 declaration under Section 564(b)(1) of the Act, 21 U.S.C. section 360bbb-3(b)(1), unless the authorization is terminated or revoked sooner.     Influenza A by PCR NEGATIVE NEGATIVE Final   Influenza B by PCR NEGATIVE NEGATIVE Final    Comment: (NOTE) The Xpert Xpress SARS-CoV-2/FLU/RSV plus assay is intended as an aid in the diagnosis of influenza from Nasopharyngeal swab specimens and should not be used as a sole basis for treatment. Nasal washings and aspirates are unacceptable for Xpert Xpress SARS-CoV-2/FLU/RSV testing.  Fact Sheet for Patients: EntrepreneurPulse.com.au  Fact Sheet for Healthcare Providers: IncredibleEmployment.be  This test is not yet approved or cleared by the [place] FDA and has been authorized for detection and/or diagnosis of SARS-CoV-2 by FDA under an Emergency Use Authorization (EUA). This EUA will remain in effect (meaning this test can be used) for the duration of the COVID-19 declaration under Section 564(b)(1) of the Act, 21 U.S.C. section 360bbb-3(b)(1), unless the authorization is terminated or revoked.  Performed  at [facility], [place] [address], [place], [place] [address]   Blood Culture ID Panel (Reflexed)     Status: Abnormal   Collection Time: 11/18/20  5:00 AM  Result Value Ref Range Status   Enterococcus faecalis NOT  DETECTED NOT DETECTED Final   Enterococcus Faecium NOT DETECTED NOT DETECTED Final   Listeria monocytogenes NOT DETECTED NOT DETECTED Final   Staphylococcus species DETECTED (A) NOT DETECTED Final    Comment: CRITICAL RESULT CALLED TO, READ BACK BY AND VERIFIED WITH: L. ADKINS RN, AT [id number] 11/19/20 D. VANHOOK    Staphylococcus aureus (BCID) NOT DETECTED NOT DETECTED Final   Staphylococcus epidermidis NOT DETECTED NOT DETECTED Final   Staphylococcus lugdunensis NOT DETECTED NOT DETECTED Final   Streptococcus species NOT DETECTED NOT DETECTED Final   Streptococcus agalactiae NOT DETECTED NOT DETECTED Final   Streptococcus pneumoniae NOT DETECTED NOT DETECTED Final   Streptococcus pyogenes NOT DETECTED NOT DETECTED Final   A.calcoaceticus-baumannii NOT DETECTED NOT DETECTED Final   Bacteroides fragilis NOT DETECTED NOT DETECTED Final   Enterobacterales NOT DETECTED NOT DETECTED Final   Enterobacter cloacae complex NOT DETECTED NOT DETECTED Final   Escherichia coli NOT DETECTED NOT DETECTED Final   Klebsiella aerogenes NOT DETECTED NOT DETECTED Final   Klebsiella oxytoca NOT DETECTED NOT DETECTED Final   Klebsiella pneumoniae NOT DETECTED NOT DETECTED Final   Proteus species NOT DETECTED NOT DETECTED Final   Salmonella species NOT DETECTED NOT DETECTED Final   Serratia marcescens NOT DETECTED NOT DETECTED Final   Haemophilus influenzae NOT DETECTED NOT DETECTED Final   Neisseria meningitidis NOT DETECTED NOT DETECTED Final   Pseudomonas aeruginosa NOT DETECTED NOT DETECTED Final   Stenotrophomonas maltophilia NOT DETECTED NOT DETECTED Final   Candida albicans NOT DETECTED NOT DETECTED Final   Candida auris NOT DETECTED NOT DETECTED Final   Candida glabrata NOT DETECTED NOT DETECTED Final   Candida krusei NOT DETECTED NOT DETECTED Final   Candida parapsilosis NOT DETECTED NOT DETECTED Final   Candida tropicalis NOT DETECTED NOT DETECTED Final   Cryptococcus neoformans/gattii NOT DETECTED  NOT DETECTED Final    Comment: Performed at [facility], [address] [place], [place] [address]    Blood culture finding likely contaminant. Patient needs to be contacted to assess for s/s suggestive of bacteremia and should return for evaluation if present.  ED Provider: MD Curlene Labrum, PharmD, BCPS 11/22/2020 10:44 AM ED Clinical Pharmacist -  [phone]

## 2020-12-02 ENCOUNTER — Encounter (HOSPITAL_COMMUNITY): Payer: Self-pay

## 2020-12-02 ENCOUNTER — Other Ambulatory Visit: Payer: Self-pay

## 2020-12-02 ENCOUNTER — Ambulatory Visit (HOSPITAL_COMMUNITY)
Admission: EM | Admit: 2020-12-02 | Discharge: 2020-12-02 | Disposition: A | Discharge status: Home / Self Care | Payer: Medicare PPO | Attending: Family Medicine | Admitting: Family Medicine

## 2020-12-02 DIAGNOSIS — J3089 Other allergic rhinitis: Secondary | ICD-10-CM

## 2020-12-02 DIAGNOSIS — R0981 Nasal congestion: Secondary | ICD-10-CM

## 2020-12-02 DIAGNOSIS — R5383 Other fatigue: Secondary | ICD-10-CM

## 2020-12-02 MED ORDER — PREDNISONE 20 MG PO TABS: 40.0000 mg | ORAL_TABLET | Freq: Every day | ORAL | 0 refills | Status: DC

## 2020-12-02 MED ORDER — FLUTICASONE PROPIONATE 50 MCG/ACT NA SUSP: 1.0000 | Freq: Two times a day (BID) | NASAL | 2 refills | Status: DC

## 2020-12-02 NOTE — ED Provider Notes (Signed)
[place]    CSN: [id number] Arrival date & time: 12/02/20  1421      History   Chief Complaint Chief Complaint  Patient presents with   Fatigue    HPI Kenneth Daniel is a 75 y.o. male.   Patient presenting today with 3-day history of fatigue, runny nose, congestion.  States he just recovered from [place], diagnosed 11/16/2020 and was feeling nearly back to baseline when the symptoms started.  He does have a known history of significant seasonal allergies, thought that is what his symptoms were so he started back on his Clarinex, Astelin daily with mild relief but not full relief as he usually would have.  He denies fevers, chills, cough, chest pain, shortness of breath, new sick contacts.   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, right    Hearing loss    wears hearing aids   HLD (hyperlipidemia)    Hypertension    Prostate cancer ([place])    Wears glasses     Patient Active Problem List   Diagnosis Date Noted   Acute pyelonephritis 09/22/2020   Pyelonephritis 09/22/2020   Myelopathy ([place]) 11/09/2019    Past Surgical History:  Procedure Laterality Date   ANTERIOR CERVICAL DECOMP/DISCECTOMY FUSION N/A 11/09/2019   Procedure: ANTERIOR CERVICAL DECOMPRESSION FUSION CERVICAL 3-4 WITH INSTRUMENTATION AND ALLOGRAFT;  Surgeon: Phylliss Bob, MD;  Location: [facility];  Service: Orthopedics;  Laterality: N/A;   CARDIAC CATHETERIZATION     COLONOSCOPY  2020   cyst removed     back- benign   PROSTATECTOMY  2004   UPPER GI ENDOSCOPY  2021       Home Medications    Prior to Admission medications   Medication Sig Start Date End Date Taking? Authorizing Provider  aspirin 81 MG EC tablet Take 81 mg by mouth daily.   Yes [provider]  atorvastatin (LIPITOR) 40 MG tablet Take 40 mg by mouth at bedtime.   Yes [provider]  fluticasone  (FLONASE) 50 MCG/ACT nasal spray Place 1 spray into both nostrils 2 (two) times daily. 12/02/20  Yes Volney American, PA-C  nebivolol (BYSTOLIC) 10 MG tablet TAKE 1 TABLET (10 MG TOTAL) BY MOUTH IN THE MORNING. 10/17/20 01/15/21 Yes Tolia, Sunit, DO  predniSONE (DELTASONE) 20 MG tablet Take 2 tablets (40 mg total) by mouth daily with breakfast. 12/02/20  Yes Volney American, PA-C  acetaminophen (TYLENOL) 500 MG tablet Take 1,000 mg by mouth every 8 (eight) hours as needed for moderate pain.    [provider]  aluminum hydroxide-magnesium carbonate (GAVISCON) 95-358 MG/15ML SUSP Take 30 mLs by mouth as needed for indigestion or heartburn.    [provider]  ascorbic acid (VITAMIN C) 500 MG tablet Take 500 mg by mouth daily.    [provider]  azelastine (ASTELIN) 0.1 % nasal spray Place 2 sprays into both nostrils 2 (two) times daily. 10/29/20   Valentina Shaggy, MD  b complex vitamins tablet Take 1 tablet by mouth daily.    [provider]  cholecalciferol (VITAMIN D3) 25 MCG (1000 UNIT) tablet Take 1,000 Units by mouth daily.    [provider]  desloratadine (CLARINEX) 5 MG tablet once a day as needed for runny nose or itch 10/29/20   Valentina Shaggy, MD  [facility] EXTRACT PO Take 1 capsule by mouth daily  as needed (cold symptoms).    [provider]  Magnesium 250 MG TABS Take 250 mg by mouth daily.    [provider]  Melatonin 5 MG CAPS Take 10 mg by mouth at bedtime.    [provider]  Nutritional Supplements (JUICE PLUS FIBRE PO) Take 2 capsules by mouth daily.    [provider]  Probiotic Product (PROBIOTIC DAILY PO) Take 1 capsule by mouth daily.    [provider]  valsartan-hydrochlorothiazide (DIOVAN-HCT) 320-12.5 MG tablet Take 1 tablet by mouth daily. 02/21/20   [provider]  zinc gluconate 50 MG tablet Take 50 mg by mouth daily as needed (supplement).    [provider]    Family History Family History  Problem Relation Age of Onset   Pancreatic cancer Mother    Prostate cancer Father     Social History Social History   Tobacco Use   Smoking status: Former    Types: Cigarettes    Quit date: 1974    Years since quitting: 48.6   Smokeless tobacco: Never  Vaping Use   Vaping Use: Never used  Substance Use Topics   Alcohol use: Never   Drug use: Never     Allergies   Patient has no known allergies.   Review of Systems Review of Systems Per HPI  Physical Exam Triage Vital Signs ED Triage Vitals  Enc Vitals Group     BP 12/02/20 1558 (!) 141/81     Pulse Rate 12/02/20 1558 82     Resp 12/02/20 1558 18     Temp 12/02/20 1558 98.8 F (37.1 C)     Temp Source 12/02/20 1558 Oral     SpO2 12/02/20 1558 99 %     Weight --      Height --      Head Circumference --      Peak Flow --      Pain Score 12/02/20 1602 0     Pain Loc --      Pain Edu? --      Excl. in [place]? --    No data found.  Updated Vital Signs BP (!) 141/81 (BP Location: Right Arm)   Pulse 82   Temp 98.8 F (37.1 C) (Oral)   Resp 18   SpO2 99%   Visual Acuity Right Eye Distance:   Left Eye Distance:   Bilateral Distance:    Right Eye Near:   Left Eye Near:    Bilateral Near:     Physical Exam Vitals and nursing note reviewed.  Constitutional:      Appearance: Normal appearance.  HENT:     Head: Atraumatic.     Right Ear: Tympanic membrane normal.     Left Ear: Tympanic membrane normal.     Nose: Rhinorrhea present.     Mouth/Throat:     Mouth: Mucous membranes are moist.     Pharynx: Posterior oropharyngeal erythema present. No oropharyngeal exudate.  Eyes:     Extraocular Movements: Extraocular movements intact.     Conjunctiva/sclera: Conjunctivae normal.  Cardiovascular:     Rate and Rhythm: Normal rate and regular rhythm.  Pulmonary:     Effort: Pulmonary effort is normal. No respiratory distress.     Breath sounds: Normal  breath sounds. No wheezing or rales.  Abdominal:     General: Bowel sounds are normal. There is no distension.     Palpations: Abdomen is soft.     Tenderness: There  is no abdominal tenderness. There is no guarding.  Musculoskeletal:        General: Normal range of motion.     Cervical back: Normal range of motion and neck supple.  Skin:    General: Skin is warm and dry.  Neurological:     General: No focal deficit present.     Mental Status: He is oriented to person, place, and time.     Motor: No weakness.     Gait: Gait normal.  Psychiatric:        Mood and Affect: Mood normal.        Thought Content: Thought content normal.        Judgment: Judgment normal.   UC Treatments / Results  Labs (all labs ordered are listed, but only abnormal results are displayed) Labs Reviewed - No data to display  EKG  Radiology No results found.  Procedures Procedures (including critical care time)  Medications Ordered in UC Medications - No data to display  Initial Impression / Assessment and Plan / UC Course  I have reviewed the triage vital signs and the nursing notes.  Pertinent labs & imaging results that were available during my care of the patient were reviewed by me and considered in my medical decision making (see chart for details).     Possibly new viral illness versus allergy exacerbation.  Will add short prednisone burst, Flonase to his current allergy regimen and continue monitor for benefit.  Symptomatic management and supportive home care reviewed.  Return for acutely worsening symptoms.  Follow-up with allergist if not fully improving.  Final Clinical Impressions(s) / UC Diagnoses   Final diagnoses:  Fatigue, unspecified type  Nasal congestion  Seasonal allergic rhinitis due to other allergic trigger   Discharge Instructions   None    ED Prescriptions     Medication Sig Dispense Auth. Provider   fluticasone (FLONASE) 50 MCG/ACT nasal spray Place 1 spray  into both nostrils 2 (two) times daily. 16 g Volney American, [place]   predniSONE (DELTASONE) 20 MG tablet Take 2 tablets (40 mg total) by mouth daily with breakfast. 6 tablet Volney American, [place]      PDMP not reviewed this encounter.   Volney American, [place] 12/02/20 1645

## 2020-12-02 NOTE — ED Triage Notes (Signed)
Pt c/o sinus infection that is on and off, took at home covid-19 test that was negative. Saturday pt started having nasal drainage and weakness.

## 2020-12-11 NOTE — Progress Notes (Signed)
[name] Dept: [phone]  FOLLOW UP NOTE  Patient ID: Kenneth Daniel, male    DOB: 1946/01/20  Age: 75 y.o. MRN: [id number] Date of Office Visit: 12/12/2020  Assessment  Chief Complaint: No chief complaint on file.  HPI Kenneth Daniel is a 75 year old male who presents to the clinic for follow-up visit.  He was last seen in this clinic 10/29/2020 by Dr. Ernst Bowler for evaluation of allergic rhinitis and pruritus. In the interim, he reports that he was diagnosed with COVID-19 on 11/18/2020.  On 12/02/2020 he went to the emergency department for evaluation of symptoms including sore throat, fatigue and, runny nose, and nasal congestion due to the fact that he thought he may be reinfected with COVID.  At that time, he received Flonase and a 3-day prednisone taper.  He reports at this time his symptoms of sore throat, fatigue, and nasal symptoms have resolved at this time.  He continues Flonase 1 spray in each nostril twice a day and has used Clarinex 1 time over the past several months.  He is not currently using azelastine or nasal saline rinse.  Pruritus is reported as moderately well controlled with a flare in remission pattern with his forearms having the worst itch. He continues Clarinex 5 mg as needed with relief of symptoms. Allergic conjunctivitis is reported as moderately well controlled with occasional red and itchy eyes for which he uses olopatadine with relief of symptoms.  His current medications are listed in the chart.   Drug Allergies:  No Known Allergies  Physical Exam: BP 118/74   Pulse 83   Temp 97.9 F (36.6 C)   Resp 18   Ht '6\' 2"'$  (1.88 m)   Wt 181 lb 9.6 oz (82.4 kg)   SpO2 96%   BMI 23.32 kg/m    Physical Exam Vitals reviewed.  Constitutional:      Appearance: Normal appearance.  HENT:     Head: Normocephalic and atraumatic.     Right Ear: Tympanic membrane normal.     Left Ear: Tympanic membrane normal.     Nose:      Comments: Bilateral nares edematous and pale with clear nasal drainage noted.  Pharynx normal.  Ears normal.  Eyes normal.    Mouth/Throat:     Pharynx: Oropharynx is clear.  Eyes:     Conjunctiva/sclera: Conjunctivae normal.  Cardiovascular:     Rate and Rhythm: Normal rate and regular rhythm.     Heart sounds: Normal heart sounds. No murmur heard. Pulmonary:     Effort: Pulmonary effort is normal.     Breath sounds: Normal breath sounds.     Comments: Lungs clear to auscultation Musculoskeletal:        General: Normal range of motion.     Cervical back: Normal range of motion and neck supple.  Skin:    General: Skin is warm and dry.  Neurological:     Mental Status: He is alert and oriented to person, place, and time.  Psychiatric:        Mood and Affect: Mood normal.        Behavior: Behavior normal.        Thought Content: Thought content normal.        Judgment: Judgment normal.    Assessment and Plan: 1. Perennial allergic rhinitis   2. Pruritus   3. Seasonal allergic conjunctivitis     Meds ordered this encounter  Medications   azelastine (ASTELIN) 0.1 %  nasal spray    Sig: Place 2 sprays into both nostrils 2 (two) times daily.    Dispense:  30 mL    Refill:  5   desloratadine (CLARINEX) 5 MG tablet    Sig: once a day as needed for runny nose or itch    Dispense:  30 tablet    Refill:  5   fluticasone (FLONASE) 50 MCG/ACT nasal spray    Sig: Place 1 spray into both nostrils 2 (two) times daily.    Dispense:  16 g    Refill:  2     Patient Instructions  Allergic rhinitis/pruritus Continue allergen avoidance measures directed toward mold and dust mite as listed below Continue Flonase 1 spray in each nostril twice a day as needed for stuffy nose.  In the right nostril, point the applicator out toward the right ear. In the left nostril, point the applicator out toward the left ear Continue azelastine 2 sprays in each nostril twice a day as needed for runny nose  or itch Continue Clarinex 5 mg once a day as needed for runny nose or itch Consider saline nasal rinses as needed for nasal symptoms. Use this before any medicated nasal sprays for best result  Allergic conjunctivitis Some over the counter eye drops include Pataday one drop in each eye once a day as needed for red, itchy eyes OR Zaditor one drop in each eye twice a day as needed for red itchy eyes.  Pruritus Continue a twice a day moisturizing routine Continue Clarinex once a day as needed for itch (as listed above)  Call the clinic if this treatment plan is not working well for you.  Follow up in 1 year or sooner if needed.   Return in about 1 year (around 12/12/2021), or if symptoms worsen or fail to improve.    Thank you for the opportunity to care for this patient.  Please do not hesitate to contact me with questions.  Gareth Morgan, FNP [facility]

## 2020-12-11 NOTE — Patient Instructions (Addendum)
Allergic rhinitis/pruritus Continue allergen avoidance measures directed toward mold and dust mite as listed below Continue Flonase 1 spray in each nostril twice a day as needed for stuffy nose.  In the right nostril, point the applicator out toward the right ear. In the left nostril, point the applicator out toward the left ear Continue azelastine 2 sprays in each nostril twice a day as needed for runny nose or itch Continue Clarinex 5 mg once a day as needed for runny nose or itch Consider saline nasal rinses as needed for nasal symptoms. Use this before any medicated nasal sprays for best result  Allergic conjunctivitis Some over the counter eye drops include Pataday one drop in each eye once a day as needed for red, itchy eyes OR Zaditor one drop in each eye twice a day as needed for red itchy eyes.  Pruritus Continue a twice a day moisturizing routine Continue Clarinex once a day as needed for itch (as listed above)  Call the clinic if this treatment plan is not working well for you.  Follow up in 1 year or sooner if needed.  Control of Mold Allergen Mold and fungi can grow on a variety of surfaces provided certain temperature and moisture conditions exist.  Outdoor molds grow on plants, decaying vegetation and soil.  The major outdoor mold, Alternaria and Cladosporium, are found in very high numbers during hot and dry conditions.  Generally, a late Summer - Fall peak is seen for common outdoor fungal spores.  Rain will temporarily lower outdoor mold spore count, but counts rise rapidly when the rainy period ends.  The most important indoor molds are Aspergillus and Penicillium.  Dark, humid and poorly ventilated basements are ideal sites for mold growth.  The next most common sites of mold growth are the bathroom and the kitchen.  Outdoor [employer] Use air conditioning and keep windows closed Avoid exposure to decaying vegetation. Avoid leaf raking. Avoid grain handling. Consider  wearing a face mask if working in moldy areas.  Indoor Mold Control Maintain humidity below 50%. Clean washable surfaces with 5% bleach solution. Remove sources e.g. Contaminated carpets.   Control of Dust Mite Allergen Dust mites play a major role in allergic asthma and rhinitis. They occur in environments with high humidity wherever human skin is found. Dust mites absorb humidity from the atmosphere (ie, they do not drink) and feed on organic matter (including shed human and animal skin). Dust mites are a microscopic type of insect that you cannot see with the naked eye. High levels of dust mites have been detected from mattresses, pillows, carpets, upholstered furniture, bed covers, clothes, soft toys and any woven material. The principal allergen of the dust mite is found in its feces. A gram of dust may contain 1,000 mites and 250,000 fecal particles. Mite antigen is easily measured in the air during house cleaning activities. Dust mites do not bite and do not cause harm to humans, other than by triggering allergies/asthma.  Ways to decrease your exposure to dust mites in your home:  1. Encase mattresses, box springs and pillows with a mite-impermeable barrier or cover  2. Wash sheets, blankets and drapes weekly in hot water (130 F) with detergent and dry them in a dryer on the hot setting.  3. Have the room cleaned frequently with a vacuum cleaner and a damp dust-mop. For carpeting or rugs, vacuuming with a vacuum cleaner equipped with a high-efficiency particulate air (HEPA) filter. The dust mite allergic individual should  not be in a room which is being cleaned and should wait 1 hour after cleaning before going into the room.  4. Do not sleep on upholstered furniture (eg, couches).  5. If possible removing carpeting, upholstered furniture and drapery from the home is ideal. Horizontal blinds should be eliminated in the rooms where the person spends the most time (bedroom, study,  television room). Washable vinyl, roller-type shades are optimal.  6. Remove all non-washable stuffed toys from the bedroom. Wash stuffed toys weekly like sheets and blankets above.  7. Reduce indoor humidity to less than 50%. Inexpensive humidity monitors can be purchased at most hardware stores. Do not use a humidifier as can make the problem worse and are not recommended.

## 2020-12-12 ENCOUNTER — Ambulatory Visit: Payer: Medicare PPO | Admitting: Family Medicine

## 2020-12-12 ENCOUNTER — Other Ambulatory Visit: Payer: Self-pay

## 2020-12-12 ENCOUNTER — Encounter: Payer: Self-pay | Admitting: Family Medicine

## 2020-12-12 VITALS — BP 118/74 | HR 83 | Temp 97.9°F | Resp 18 | Ht 74.0 in | Wt 181.6 lb

## 2020-12-12 DIAGNOSIS — J3089 Other allergic rhinitis: Secondary | ICD-10-CM

## 2020-12-12 DIAGNOSIS — H1010 Acute atopic conjunctivitis, unspecified eye: Secondary | ICD-10-CM

## 2020-12-12 DIAGNOSIS — H1013 Acute atopic conjunctivitis, bilateral: Secondary | ICD-10-CM | POA: Diagnosis not present

## 2020-12-12 DIAGNOSIS — L299 Pruritus, unspecified: Secondary | ICD-10-CM

## 2020-12-12 MED ORDER — AZELASTINE HCL 0.1 % NA SOLN: 2.0000 | Freq: Two times a day (BID) | NASAL | 5 refills | Status: DC

## 2020-12-12 MED ORDER — DESLORATADINE 5 MG PO TABS: ORAL_TABLET | ORAL | 5 refills | Status: DC

## 2020-12-12 MED ORDER — FLUTICASONE PROPIONATE 50 MCG/ACT NA SUSP: 1.0000 | Freq: Two times a day (BID) | NASAL | 2 refills | Status: DC

## 2021-01-01 ENCOUNTER — Encounter: Payer: Self-pay | Admitting: Cardiology

## 2021-01-01 ENCOUNTER — Other Ambulatory Visit: Payer: Self-pay

## 2021-01-01 ENCOUNTER — Ambulatory Visit: Payer: Medicare PPO | Admitting: Cardiology

## 2021-01-01 VITALS — BP 122/69 | HR 82 | Resp 16 | Ht 74.0 in | Wt 181.0 lb

## 2021-01-01 DIAGNOSIS — I6521 Occlusion and stenosis of right carotid artery: Secondary | ICD-10-CM

## 2021-01-01 DIAGNOSIS — I10 Essential (primary) hypertension: Secondary | ICD-10-CM

## 2021-01-01 DIAGNOSIS — R072 Precordial pain: Secondary | ICD-10-CM

## 2021-01-01 DIAGNOSIS — E7800 Pure hypercholesterolemia, unspecified: Secondary | ICD-10-CM

## 2021-01-01 DIAGNOSIS — I351 Nonrheumatic aortic (valve) insufficiency: Secondary | ICD-10-CM

## 2021-01-01 DIAGNOSIS — Z8616 Personal history of COVID-19: Secondary | ICD-10-CM

## 2021-01-01 DIAGNOSIS — Z87891 Personal history of nicotine dependence: Secondary | ICD-10-CM

## 2021-01-01 NOTE — Progress Notes (Signed)
Date:  01/01/2021   ID:  Tauheed Mcfayden, DOB 04/10/1946, MRN [id number]  PCP:  Lucianne Lei, MD  Cardiologist:  Rex Kras, DO, [facility] (established care 05/22/2020) Former Cardiology Providers: Dr. Orie Rout [facility])  Date: 01/01/21 Last Office Visit: 09/19/2020   Chief Complaint  Patient presents with   Follow-up    Carotid artery disease and aortic regurgitation management   Chest Pain    HPI  Haroun Cotham is a 75 y.o. male who presents to the office with a chief complaint of " 1 month follow-up to reevaluate decreased physical endurance/fatigue and review test results." Patient's past medical history and cardiovascular risk factors include: Prostate Cancer (s/p surgery, radiation,  and hormone tx), Hypertension, Hyperlipidemia, asymptomatic right ICA stenosis, aortic regurgitation, Hx of COVID infection, advanced age, former smoker.  Patient was originally referred to the office for evaluation of decreased physical endurance by his primary care provider.  Given the change in functional status and multiple cardiovascular risk factors he underwent an echocardiogram and stress test for further risk stratification.  Echocardiogram notes preserved LVEF with mild to moderate valvular heart disease, nuclear stress test was overall a low risk study, and carotid duplex which was ordered due to carotid bruit notes right ICA stenosis less than 50%.  Patient presents for 89-monthfollow-up and states that he is doing well from a cardiovascular standpoint.  He is not having any chest pain at rest or with effort related activities.  He is slowly increasing physical activity and now he is up to 0.5 miles on a daily basis.  Patient states that he did contract COVID-19 and a kidney infection since last office visit which has delayed his progress overall.  No hospitalizations or urgent care visits for cardiovascular symptoms.  FUNCTIONAL STATUS: Currently in physical therapy.    ALLERGIES: No Known  Allergies  MEDICATION LIST PRIOR TO VISIT: Current Meds  Medication Sig   acetaminophen (TYLENOL) 500 MG tablet Take 1,000 mg by mouth every 8 (eight) hours as needed for moderate pain.   aluminum hydroxide-magnesium carbonate (GAVISCON) 95-358 MG/15ML SUSP Take 30 mLs by mouth as needed for indigestion or heartburn.   ascorbic acid (VITAMIN C) 500 MG tablet Take 500 mg by mouth daily.   aspirin 81 MG EC tablet Take 81 mg by mouth daily.   atorvastatin (LIPITOR) 40 MG tablet Take 40 mg by mouth at bedtime.   azelastine (ASTELIN) 0.1 % nasal spray Place 2 sprays into both nostrils 2 (two) times daily.   b complex vitamins tablet Take 1 tablet by mouth daily.   cholecalciferol (VITAMIN D3) 25 MCG (1000 UNIT) tablet Take 1,000 Units by mouth daily.   Coenzyme Q10-levOCARNitine (CO Q-10 PLUS PO) Take 1 tablet by mouth daily at 12 noon.   desloratadine (CLARINEX) 5 MG tablet once a day as needed for runny nose or itch   ECHINACEA EXTRACT PO Take 1 capsule by mouth daily as needed (cold symptoms).   fluticasone (FLONASE) 50 MCG/ACT nasal spray Place 1 spray into both nostrils 2 (two) times daily.   Magnesium 250 MG TABS Take 250 mg by mouth daily.   Melatonin 5 MG CAPS Take 10 mg by mouth at bedtime.   nebivolol (BYSTOLIC) 10 MG tablet TAKE 1 TABLET (10 MG TOTAL) BY MOUTH IN THE MORNING.   Nutritional Supplements (JUICE PLUS FIBRE PO) Take 2 capsules by mouth daily.   predniSONE (DELTASONE) 20 MG tablet Take 2 tablets (40 mg total) by mouth daily with breakfast.  Probiotic Product (PROBIOTIC DAILY PO) Take 1 capsule by mouth daily.   valsartan-hydrochlorothiazide (DIOVAN-HCT) 320-12.5 MG tablet Take 1 tablet by mouth daily.   zinc gluconate 50 MG tablet Take 50 mg by mouth daily as needed (supplement).     PAST MEDICAL HISTORY: 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, right     Hearing loss    wears hearing aids   HLD (hyperlipidemia)    Hypertension    Prostate cancer ([place])    Wears glasses     PAST SURGICAL HISTORY: Past Surgical History:  Procedure Laterality Date   ANTERIOR CERVICAL DECOMP/DISCECTOMY FUSION N/A 11/09/2019   Procedure: ANTERIOR CERVICAL DECOMPRESSION FUSION CERVICAL 3-4 WITH INSTRUMENTATION AND ALLOGRAFT;  Surgeon: Phylliss Bob, MD;  Location: [facility];  Service: Orthopedics;  Laterality: N/A;   CARDIAC CATHETERIZATION     COLONOSCOPY  2020   cyst removed     back- benign   PROSTATECTOMY  2004   UPPER GI ENDOSCOPY  2021    FAMILY HISTORY: The patient family history includes Pancreatic cancer in his mother; Prostate cancer in his father.  SOCIAL HISTORY:  The patient  reports that he quit smoking about 48 years ago. His smoking use included cigarettes. He has never used smokeless tobacco. He reports that he does not drink alcohol and does not use drugs.  REVIEW OF SYSTEMS: Review of Systems  Constitutional: Negative for chills, fever and malaise/fatigue.  HENT:  Negative for hoarse voice and nosebleeds.        Wears hearing aid.   Eyes:  Negative for discharge, double vision and pain.  Cardiovascular:  Negative for chest pain, claudication, dyspnea on exertion, leg swelling, near-syncope, orthopnea, palpitations, paroxysmal nocturnal dyspnea and syncope.  Respiratory:  Negative for hemoptysis and shortness of breath.   Musculoskeletal:  Negative for muscle cramps and myalgias.  Gastrointestinal:  Negative for abdominal pain, constipation, diarrhea, hematemesis, hematochezia, melena, nausea and vomiting.  Neurological:  Negative for dizziness and light-headedness.   PHYSICAL EXAM: Vitals with BMI 01/01/2021 12/12/2020 12/02/2020  Height _0  _1  -  Weight 181 lbs 181 lbs 10 oz -  BMI 42.59 56.38 -  Systolic 756 433 295  Diastolic 69 74 81  Pulse 82 83 82   CONSTITUTIONAL: Well-developed and well-nourished. No acute distress.   SKIN: Skin is warm and dry. No rash noted. No cyanosis. No pallor. No jaundice HEAD: Normocephalic and atraumatic.  EYES: No scleral icterus MOUTH/THROAT: Moist oral membranes.  NECK: No JVD present. No thyromegaly noted. Right carotid bruits  LYMPHATIC: No visible cervical adenopathy.  CHEST Normal respiratory effort. No intercostal retractions.  Mild tenderness to palpation left side of the chest at the midclavicular line fifth intercostal space. LUNGS: Clear to auscultation bilaterally. No stridor. No wheezes. No rales.  CARDIOVASCULAR: Regular rate and rhythm, positive S1-S2, 3 out of 6 diastolic murmur heard at the left sternal border. No rubs or gallops appreciated. ABDOMINAL: No apparent ascites.  EXTREMITIES: No bilateral lower leg edema, warm to touch, 2+ posterior tibial pulses bilaterally. HEMATOLOGIC: No significant bruising NEUROLOGIC: Oriented to person, place, and time. Nonfocal. Normal muscle tone.  PSYCHIATRIC: Normal mood and affect. Normal behavior. Cooperative  CARDIAC DATABASE: EKG: 05/22/2020: Normal sinus rhythm, 75 bpm, normal axis, LVH per voltage criteria, without underlying injury pattern.   09/19/2020: Sinus rhythm at a rate of 82 bpm.  Normal axis.  LVH   Echocardiogram: 06/04/2020:  Left ventricle cavity is normal in size. Moderate concentric hypertrophy of the left ventricle. Normal global wall motion. Normal LV systolic function with EF 68%. Doppler evidence of grade I (impaired) diastolic  dysfunction, normal LAP. Calculated EF 68%.  Left atrial cavity is mildly dilated.  Trileaflet aortic valve.  Moderate (Grade III) aortic regurgitation.  Mild tricuspid regurgitation.  Mild pulmonic regurgitation.  No evidence of pulmonary hypertension.  Large 8X8 echolucent structure seen in liver, likely a cyst.    Stress Testing: Lexiscan Tetrofosmin Stress Test 05/29/2020: Nondiagnostic ECG stress. Myocardial perfusion is normal. Overall LV systolic function is  normal without regional wall motion abnormalities. Stress LV EF: 63%.  No previous exam available for comparison. Low risk.   Heart Catheterization: 18 years ago w/ Dr. Orie Rout back in [facility]. No records to review.   Carotid artery duplex 06/04/2020: Stenosis in the right internal carotid artery (16-49%). Stenosis in the right external carotid artery (<50%). Peak systolic velocities in the left bifurcation, internal, external and common carotid arteries are within normal limits. Antegrade right vertebral artery flow. Antegrade left vertebral artery flow. Follow up in one year is appropriate if clinically indicated.  LABORATORY DATA: CBC Latest Ref Rng & Units 11/18/2020 11/02/2020 09/25/2020  WBC 4.0 - 10.5 K/uL 3.6(L) 5.1 8.4  Hemoglobin 13.0 - 17.0 g/dL 11.3(L) 11.2(L) 11.6(L)  Hematocrit 39.0 - 52.0 % 35.1(L) 35.0(L) 35.0(L)  Platelets 150 - 400 K/uL 223 330 186    CMP Latest Ref Rng & Units 11/18/2020 11/02/2020 09/23/2020  Glucose 70 - 99 mg/dL 107(H) 99 130(H)  BUN 8 - 23 mg/dL _0 Creatinine 0.61 - 1.24 mg/dL 1.10 1.18 1.03  Sodium 135 - 145 mmol/L 134(L) 137 135  Potassium 3.5 - 5.1 mmol/L 3.6 4.6 3.8  Chloride 98 - 111 mmol/L 100 101 99  CO2 22 - 32 mmol/L _1 Calcium 8.9 - 10.3 mg/dL 9.1 9.6 8.8(L)  Total Protein 6.5 - 8.1 g/dL 7.9 8.6(H) -  Total Bilirubin 0.3 - 1.2 mg/dL 0.7 0.5 -  Alkaline Phos 38 - 126 U/L 74 79 -  AST 15 - 41 U/L 26 19 -  ALT 0 - 44 U/L 28 23 -    Lipid Panel  No results found for: CHOL, TRIG, HDL, CHOLHDL, VLDL, LDLCALC, LDLDIRECT, LABVLDL  No components found for: NTPROBNP No results for input(s): PROBNP in the last 8760 hours. No results for input(s): TSH in the last 8760 hours.  BMP Recent Labs    09/23/20 0556 11/02/20 1556 11/18/20 0500  NA 135 137 134*  K 3.8 4.6 3.6  CL 99 101 100  CO2 _2 GLUCOSE 130* 99 107*  BUN _3 CREATININE 1.03 1.18 1.10  CALCIUM 8.8* 9.6 9.1  GFRNONAA >60 >60 >60    HEMOGLOBIN  A1C No results found for: HGBA1C, MPG  External Labs: Collected: 10/25/2019 Creatinine 0.99 mg/dL. eGFR: >60 mL/min per 1.73 m Potassium 4.2 AST 24, ALT 38, alkaline phosphatase 59 Lipid profile: Total cholesterol 105, triglycerides 54, HDL 49, LDL 43, non-HDL 56 Hemoglobin A1c: 5.6 TSH: 0.73,Free T4 1.3, and Free T3 2.8 BNP: 53   IMPRESSION:    ICD-10-CM   1. Precordial pain  R07.2     2. Nonrheumatic aortic valve insufficiency  I35.1     3. Asymptomatic stenosis of right carotid artery  I65.21     4. Benign hypertension  I10  5. Hypercholesteremia  E78.00     6. Former smoker  Z87.891     63. History of COVID-19  Z86.16        RECOMMENDATIONS: Domingo Fuson is a 75 y.o. male whose past medical history and cardiac risk factors include: Prostate Cancer (s/p surgery, radiation,  and hormone tx), Hypertension, Hyperlipidemia, asymptomatic right ICA stenosis, aortic regurgitation, Hx of COVID infection, advanced age, former smoker.  Precordial pain Resolved since last office visit. Reviewed the last ischemic evaluation as part of decision-making process during today's encounter. Results noted above for further reference  Nonrheumatic aortic valve insufficiency Asymptomatic. LVEF remains preserved. Follow-up study scheduled for February 2023. Will see him in office after 3 echocardiogram.  Asymptomatic stenosis of right carotid artery Continue aspirin and statin therapy. Repeat carotid duplex February 2023  Benign hypertension Office blood pressures are within excellent control. Medications reconciled. Low-salt diet recommended.  Hypercholesteremia Continue statin therapy. I have asked the patient to bring in his recent labs from PCPs office at the next office visit.  Former smoker Educated on the importance of continued smoking cessation.  FINAL MEDICATION LIST END OF ENCOUNTER: No orders of the defined types were placed in this encounter.    Current  Outpatient Medications:    acetaminophen (TYLENOL) 500 MG tablet, Take 1,000 mg by mouth every 8 (eight) hours as needed for moderate pain., Disp: , Rfl:    aluminum hydroxide-magnesium carbonate (GAVISCON) 95-358 MG/15ML SUSP, Take 30 mLs by mouth as needed for indigestion or heartburn., Disp: , Rfl:    ascorbic acid (VITAMIN C) 500 MG tablet, Take 500 mg by mouth daily., Disp: , Rfl:    aspirin 81 MG EC tablet, Take 81 mg by mouth daily., Disp: , Rfl:    atorvastatin (LIPITOR) 40 MG tablet, Take 40 mg by mouth at bedtime., Disp: , Rfl:    azelastine (ASTELIN) 0.1 % nasal spray, Place 2 sprays into both nostrils 2 (two) times daily., Disp: 30 mL, Rfl: 5   b complex vitamins tablet, Take 1 tablet by mouth daily., Disp: , Rfl:    cholecalciferol (VITAMIN D3) 25 MCG (1000 UNIT) tablet, Take 1,000 Units by mouth daily., Disp: , Rfl:    Coenzyme Q10-levOCARNitine (CO Q-10 PLUS PO), Take 1 tablet by mouth daily at 12 noon., Disp: , Rfl:    desloratadine (CLARINEX) 5 MG tablet, once a day as needed for runny nose or itch, Disp: 30 tablet, Rfl: 5   ECHINACEA EXTRACT PO, Take 1 capsule by mouth daily as needed (cold symptoms)., Disp: , Rfl:    fluticasone (FLONASE) 50 MCG/ACT nasal spray, Place 1 spray into both nostrils 2 (two) times daily., Disp: 16 g, Rfl: 2   Magnesium 250 MG TABS, Take 250 mg by mouth daily., Disp: , Rfl:    Melatonin 5 MG CAPS, Take 10 mg by mouth at bedtime., Disp: , Rfl:    nebivolol (BYSTOLIC) 10 MG tablet, TAKE 1 TABLET (10 MG TOTAL) BY MOUTH IN THE MORNING., Disp: 90 tablet, Rfl: 0   Nutritional Supplements (JUICE PLUS FIBRE PO), Take 2 capsules by mouth daily., Disp: , Rfl:    predniSONE (DELTASONE) 20 MG tablet, Take 2 tablets (40 mg total) by mouth daily with breakfast., Disp: 6 tablet, Rfl: 0   Probiotic Product (PROBIOTIC DAILY PO), Take 1 capsule by mouth daily., Disp: , Rfl:    valsartan-hydrochlorothiazide (DIOVAN-HCT) 320-12.5 MG tablet, Take 1 tablet by mouth daily.,  Disp: , Rfl:    zinc gluconate 50 MG  tablet, Take 50 mg by mouth daily as needed (supplement)., Disp: , Rfl:   No orders of the defined types were placed in this encounter.  There are no Patient Instructions on file for this visit.   --Continue cardiac medications as reconciled in final medication list. --Return in about 6 months (around 07/01/2021) for Follow up AR and carotid disease , Review test results. Or sooner if needed. --Continue follow-up with your primary care physician regarding the management of your other chronic comorbid conditions.  Patient's questions and concerns were addressed to his satisfaction. He voices understanding of the instructions provided during this encounter.   This note was created using a voice recognition software as a result there may be grammatical errors inadvertently enclosed that do not reflect the nature of this encounter. Every attempt is made to correct such errors.  Rex Kras, [place], [facility]  Pager: [phone] Office: [phone]

## 2021-01-31 ENCOUNTER — Other Ambulatory Visit: Payer: Self-pay | Admitting: Cardiology

## 2021-01-31 DIAGNOSIS — I10 Essential (primary) hypertension: Secondary | ICD-10-CM

## 2021-01-31 DIAGNOSIS — R5383 Other fatigue: Secondary | ICD-10-CM

## 2021-02-25 ENCOUNTER — Encounter: Payer: Medicare PPO | Admitting: Emergency Medicine

## 2021-02-27 ENCOUNTER — Other Ambulatory Visit: Payer: Self-pay

## 2021-02-27 ENCOUNTER — Ambulatory Visit (HOSPITAL_COMMUNITY)
Admission: RE | Admit: 2021-02-27 | Discharge: 2021-02-27 | Disposition: A | Discharge status: Home / Self Care | Payer: Medicare PPO | Source: Ambulatory Visit | Attending: Cardiology | Admitting: Cardiology

## 2021-02-27 DIAGNOSIS — E785 Hyperlipidemia, unspecified: Secondary | ICD-10-CM | POA: Insufficient documentation

## 2021-02-27 DIAGNOSIS — K7689 Other specified diseases of liver: Secondary | ICD-10-CM | POA: Insufficient documentation

## 2021-02-27 DIAGNOSIS — I351 Nonrheumatic aortic (valve) insufficiency: Secondary | ICD-10-CM | POA: Insufficient documentation

## 2021-02-27 DIAGNOSIS — I10 Essential (primary) hypertension: Secondary | ICD-10-CM | POA: Insufficient documentation

## 2021-03-01 LAB — ECHOCARDIOGRAM COMPLETE
Area-P 1/2: 2.74 cm2
P 1/2 time: 345 msec
S' Lateral: 2.6 cm

## 2021-03-02 DIAGNOSIS — I351 Nonrheumatic aortic (valve) insufficiency: Secondary | ICD-10-CM | POA: Insufficient documentation

## 2021-03-03 NOTE — Progress Notes (Signed)
Called and spoke to pt, pt aware. Faxed results to PCP.

## 2021-03-07 ENCOUNTER — Telehealth: Payer: Self-pay | Admitting: *Deleted

## 2021-03-07 NOTE — Telephone Encounter (Signed)
Can you please have him begin saline nasal rinses followed by Flonase and azelastine daily? Please call the clinic with worsening symptoms or if he develops a fever. Thank you

## 2021-03-07 NOTE — Telephone Encounter (Signed)
Patient called this morning and stated that he woke up this morning and ate oatmeal and then noticed that he had a sore throat when swallowing. I asked if she felt congested or had any drainage and he stated not at this moment. I asked if he used his astelin nasal spray and he stated no. I did advise to go ahead and use 2 sprays each nostril this morning and this evening to see if that will help and that I would call him back. Do you have any further recommendation?

## 2021-03-07 NOTE — Telephone Encounter (Signed)
Called back patient and advised of plan. Patient verbalized understanding.

## 2021-04-16 ENCOUNTER — Ambulatory Visit: Payer: Self-pay | Admitting: Surgery

## 2021-04-16 NOTE — H&P (Signed)
Kenneth Daniel [id number]   Referring Provider:  Sharrell Ku, MD   Subjective   Chief Complaint: New Consultation (Hernia )     History of Present Illness: 75 year old gentleman with history of aortic regurgitation, carotid stenosis, hypertension, hyperlipidemia, prostate cancer status post robotic prostatectomy in 2004 (subsequently radiation in 2015 and currently on hormone tx)  and acid reflux who presents for evaluation of a nonrecurrent reducible right inguinal hernia. He first noticed this around the beginning of October.  He had C3-4 surgery for arthritis previously and was initiating his gym activities following surgery when he noticed this.  He was evaluated by his urologist Dr. Milford Cage and diagnosed with a hernia and plans were made to repair this in the future.  It has increased in size slightly.  Does not really cause any significant pain, but he does note constipation which he treats with over-the-counter MiraLAX.  No obstructive symptoms or weight loss.  His cardiologist is Dr. Terri Skains at [facility] cardiovascular.  Primary care doctor is Dr. Lucianne Lei.  Echocardiogram February of this year demonstrates preserved ejection fraction with mild to moderate valvular heart disease, nuclear stress test cannulated this year was low risk study, and carotid duplex February of this year noted stenosis less than 50% on the right side.   Review of Systems: A complete review of systems was obtained from the patient.  I have reviewed this information and discussed as appropriate with the patient.  See HPI as well for other ROS.   Medical History: Past Medical History:  Diagnosis Date   Anxiety    Hyperlipidemia    Hypertension     Patient Active Problem List  Diagnosis   Acute pyelonephritis   Pyelonephritis   Myelopathy (CMS-HCC)   Nonrheumatic aortic valve insufficiency   Perennial allergic rhinitis   Pruritus   Seasonal allergic conjunctivitis   Sensorineural  hearing loss (SNHL), bilateral   Sore throat, unspecified    Past Surgical History:  Procedure Laterality Date   Neck Surgery   2021   Prostate Surgery   2004     No Known Allergies  Current Outpatient Medications on File Prior to Visit  Medication Sig Dispense Refill   amLODIPine (NORVASC) 10 MG tablet Take 1 tablet (10 mg total) by mouth once daily     azelastine (ASTELIN) 137 mcg nasal spray Place 2 sprays into one nostril 2 (two) times daily     famotidine (PEPCID) 20 MG tablet Take 1 tablet (20 mg total) by mouth at bedtime as needed     nebivoloL (BYSTOLIC) 20 mg tablet Take 1 tablet (20 mg total) by mouth once daily     valsartan-hydrochlorothiazide (DIOVAN-HCT) 320-12.5 mg tablet Take 1 tablet by mouth once daily     atorvastatin (LIPITOR) 40 MG tablet Take 1 tablet (40 mg total) by mouth at bedtime     No current facility-administered medications on file prior to visit.    History reviewed. No pertinent family history.   Social History   Tobacco Use  Smoking Status Former   Types: Cigarettes   Quit date: 1973   Years since quitting: 49.9  Smokeless Tobacco Never     Social History   Socioeconomic History   Marital status: Single  Tobacco Use   Smoking status: Former    Types: Cigarettes    Quit date: 1973    Years since quitting: 49.9   Smokeless tobacco: Never  Vaping Use   Vaping Use: Never used  Substance and  Sexual Activity   Alcohol use: Not Currently   Drug use: Never    Objective:    Vitals:   04/16/21 1110  BP: (!) 140/60  Pulse: 99  Temp: 36.8 C (98.3 F)  SpO2: 96%  Weight: 85.5 kg (188 lb 6.4 oz)  Height: 188 cm (6\' 2" )    Body mass index is 24.19 kg/m.  Alert and well-appearing Unlabored respirations Abdomen soft and nontender.  There is a large partially reducible right inguinal hernia without tenderness.  Assessment and Plan:  Diagnoses and all orders for this visit:  Inguinal hernia without obstruction or gangrene,  recurrence not specified, unspecified laterality  Right inguinal hernia.  This is partially incarcerated.  Recommend proceeding with an open repair. We discussed the relevant anatomy and we discussed the technique of the procedure.  Discussed risks of bleeding, infection, pain, scarring, injury to structures in the area including nerves, blood vessels, bowel, bladder, risk of chronic pain, hernia recurrence, risk of seroma or hematoma, urinary retention, and risks of general anesthesia including cardiovascular, pulmonary, and thromboembolic complications.  Questions were answered.  Patient wishes to proceed with scheduling.  We will request cardiac clearance preoperatively.   Zacharie Portner Raquel James, MD

## 2021-04-22 ENCOUNTER — Telehealth: Payer: Self-pay | Admitting: Cardiology

## 2021-04-22 NOTE — Telephone Encounter (Signed)
PRE-OPERATIVE CARDIAC RISK ASSESSMENT  04/22/21  Patient's name: Kenneth Daniel.   MRN: [id number].    DOB: 04-24-1946  Primary care provider: Lucianne Lei, MD. Referral provider: No referring provider defined for this encounter.  Kenneth Daniel is a 75 y.o. male for which I have been requested to assess estimated cardiac risk for planned non-cardiac surgery.  Patient plans undergo right inguinal hernia repair surgery in January 2023 and the office is asked for preoperative restratification.  Last office visit 01/01/2021.  I personally called the patient today to discuss his upcoming noncardiac surgery.  He does not have any chest pain at rest or with effort related activities.  He denies orthopnea, paroxysmal nocturnal dyspnea or lower extremity swelling.  No change in overall functional status.  Recently underwent a nuclear stress test in January 2022 which notes normal myocardial perfusion and overall low risk study.  He also underwent an echocardiogram in October 2022 LVEF is preserved without any significant valvular heart disease.  Patient is considered low/acceptable risk for the planned noncardiac surgery.  No known history of unstable angina in the recent past or myocardial infarction, symptoms of decompensated heart failure, no unaddressed complex dysrhythmias, no significant valvular heart disease.  This preoperative risk assessment is a tool to assist the surgeon in estimating the cardiac risk for the proposed upcoming noncardiac surgery.  The shared decision to proceed with surgery will be ultimately at the discretion of the patient after the surgical risks, benefits, and alternatives have been discussed amongst the patient and his surgical team.   Sincerely,   Mechele Claude, [facility]  Pager: [phone] Office: [phone]

## 2021-04-30 ENCOUNTER — Other Ambulatory Visit: Payer: Self-pay | Admitting: Cardiology

## 2021-04-30 DIAGNOSIS — R5383 Other fatigue: Secondary | ICD-10-CM

## 2021-04-30 DIAGNOSIS — I10 Essential (primary) hypertension: Secondary | ICD-10-CM

## 2021-05-04 ENCOUNTER — Other Ambulatory Visit: Payer: Self-pay | Admitting: Cardiology

## 2021-05-04 DIAGNOSIS — R5383 Other fatigue: Secondary | ICD-10-CM

## 2021-05-04 DIAGNOSIS — I10 Essential (primary) hypertension: Secondary | ICD-10-CM

## 2021-05-29 ENCOUNTER — Other Ambulatory Visit: Payer: Self-pay

## 2021-05-29 ENCOUNTER — Encounter (HOSPITAL_BASED_OUTPATIENT_CLINIC_OR_DEPARTMENT_OTHER): Payer: Self-pay | Admitting: Surgery

## 2021-05-29 NOTE — Progress Notes (Signed)
Spoke w/ via phone for pre-op interview--- pt Lab needs dos----  [employer] results------ current ekg in epic/ chart COVID test -----patient states asymptomatic no test needed Arrive at ------- 0830 on 06-04-2021 NPO after MN NO Solid Food.  Clear liquids from MN until--- 0730 Med rec completed Medications to take morning of surgery ----- bystolic, may do nasal spray Diabetic medication ----- n/a Patient instructed no nail polish to be worn day of surgery Patient instructed to bring photo id and insurance card day of surgery Patient aware to have Driver (ride ) / caregiver  for 24 hours after surgery --- sister, betty hoffman Patient Special Instructions ----- n/a Pre-Op special Istructions ----- pt has telephone cardiac clearance by Dr Terri Skains on 04-22-2021 Patient verbalized understanding of instructions that were given at this phone interview. Patient denies shortness of breath, chest pain, fever, cough at this phone interview.   Anesthesia:  pt denies cardiac s&s. PCP:  Dr Criss Rosales Cardiologist : Dr Terri Skains [facility] 01-01-2021 epic) Chest x-ray : 11-18-2020 epic EKG : 11-18-2020  epic Echo : 02-27-2021 epic Stress test: 05-29-2020 epic Cardiac Cath :  per pt in 2002 had cath in [place], [place] for possible ischemia, told normal arteries, no record available Activity level: denies sob w/ any activity Sleep Study/ CPAP :no  Blood Thinner/ Instructions /Last Dose: no ASA / Instructions/ Last Dose :  ASA 81mg /  per pt was not given instructions about stopping prior to surgery.  Pt verbalized understanding to call Dr Kae Heller office to get instructions

## 2021-05-31 ENCOUNTER — Ambulatory Visit (HOSPITAL_COMMUNITY)
Admission: EM | Admit: 2021-05-31 | Discharge: 2021-05-31 | Disposition: A | Discharge status: Home / Self Care | Payer: Medicare PPO | Attending: Family Medicine | Admitting: Family Medicine

## 2021-05-31 ENCOUNTER — Encounter (HOSPITAL_COMMUNITY): Payer: Self-pay | Admitting: *Deleted

## 2021-05-31 ENCOUNTER — Other Ambulatory Visit: Payer: Self-pay

## 2021-05-31 DIAGNOSIS — Z20822 Contact with and (suspected) exposure to covid-19: Secondary | ICD-10-CM | POA: Diagnosis not present

## 2021-05-31 DIAGNOSIS — J3089 Other allergic rhinitis: Secondary | ICD-10-CM | POA: Diagnosis not present

## 2021-05-31 MED ORDER — DEXAMETHASONE SODIUM PHOSPHATE 10 MG/ML IJ SOLN
INTRAMUSCULAR | Status: AC
  Filled 2021-05-31: qty 1

## 2021-05-31 MED ORDER — DEXAMETHASONE SODIUM PHOSPHATE 10 MG/ML IJ SOLN
5.0000 mg | Freq: Once | INTRAMUSCULAR | Status: AC
  Administered 2021-05-31: 5 mg via INTRAMUSCULAR

## 2021-05-31 NOTE — Discharge Instructions (Addendum)
Continue allergy medications. Your COVID test will result by Monday and if any of your results are abnormal we will notify you. I suspect your symptoms are more allergy related.

## 2021-05-31 NOTE — ED Provider Notes (Signed)
[place]    CSN: [id number] Arrival date & time: 05/31/21  1223      History   Chief Complaint Chief Complaint  Patient presents with   Nasal Congestion   sinus infection    HPI Kenneth Daniel is a 76 y.o. male.   HPI Patient presents today with a 3-day history of nasal congestion and drainage.  Patient has a history of chronic allergic rhinitis and is currently on a daily regimen of nasal sprays and an antihistamine therapy.  He is concerned that he may have a sinus infection as he is scheduled for surgery next week. Itchy eyes over the last few day. Recalls he recently moved several boxes out of storage and suspects this may have caused his chronic allergies to flare. Denies cough, SOB , or chest congestion. He has remained afebrile and denies any sick contacts.  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, right    per last duplex in epic 06-04-2020  right ICA 16-49% and right ECA >50%   Chronic constipation    GERD (gastroesophageal reflux disease)    History of acute pyelonephritis 09/2020   w/ admission in epic due to severe sepsis   History of COVID-19 11/18/2020   positive result in epic;   per pt mild to moderate symptoms that resolved   History of external beam radiation therapy    completed in 2015 for recurrent prostate cancer (done in [facility])   History of prostate cancer    (current urologist-- dr Milford Cage, and current treatment lupron injeciton q 3months) ;   per pt in [place], [place] 2004  s/p radical prostatectomy;   recurrence 2015 completed radiation   HLD (hyperlipidemia)    Hypertension    followed by pcp and cardiology---   (05-29-2020  nuclear stress test in epic , low risk no ischemia nuclear ef 63%)   Inguinal hernia, right    Non-rheumatic aortic regurgitation    mild to moderate AR without stenosis per last echo in epic 02-27-2021   Perennial allergic rhinitis    [facility]  [facility]   Wears dentures    upper   Wears glasses    Wears hearing aid in both ears     Patient Active Problem List   Diagnosis Date Noted   Nonrheumatic aortic valve insufficiency    Perennial allergic rhinitis 12/12/2020   Pruritus 12/12/2020   Seasonal allergic conjunctivitis 12/12/2020   Acute pyelonephritis 09/22/2020   Pyelonephritis 09/22/2020   Myelopathy ([place]) 11/09/2019    Past Surgical History:  Procedure Laterality Date   ANTERIOR CERVICAL DECOMP/DISCECTOMY FUSION N/A 11/09/2019   Procedure: ANTERIOR CERVICAL DECOMPRESSION FUSION CERVICAL 3-4 WITH INSTRUMENTATION AND ALLOGRAFT;  Surgeon: Phylliss Bob, MD;  Location: [facility];  Service: Orthopedics;  Laterality: N/A;   CARDIAC CATHETERIZATION  2002   in [place], [place];   per pt stress test with possible ischemia,  told arteries normal   COLONOSCOPY  2020   CYST EXCISION  2005   per pt from back , was benign   PROSTATECTOMY  2004   in [place], [facility]  2021       Home Medications    Prior to Admission medications   Medication Sig Start Date End Date Taking? Authorizing Provider  acetaminophen (TYLENOL) 500 MG tablet Take 1,000 mg by mouth every 8 (eight) hours as needed for moderate pain.  [provider]  aluminum hydroxide-magnesium carbonate (GAVISCON) 95-358 MG/15ML SUSP Take 30 mLs by mouth as needed for indigestion or heartburn.    [provider]  ascorbic acid (VITAMIN C) 500 MG tablet Take 500 mg by mouth daily.    [provider]  aspirin 81 MG EC tablet Take 81 mg by mouth at bedtime.    [provider]  atorvastatin (LIPITOR) 40 MG tablet Take 40 mg by mouth at bedtime.    [provider]  azelastine (ASTELIN) 0.1 % nasal spray Place 2 sprays into both nostrils 2 (two) times daily as needed for rhinitis. 06/03/21   Valentina Shaggy, MD  b complex vitamins tablet Take 1 tablet by mouth daily as needed.    [provider]  cholecalciferol (VITAMIN D3) 25 MCG (1000 UNIT) tablet Take 1,000 Units by mouth daily.    [provider]  Coenzyme Q10-levOCARNitine (CO Q-10 PLUS PO) Take 1 tablet by mouth daily at 12 noon.    [provider]  desloratadine (CLARINEX) 5 MG tablet Take 1 tablet (5 mg total) by mouth in the morning and at bedtime. once a day as needed for runny nose or itch 06/03/21 09/01/21  Valentina Shaggy, MD  [facility] EXTRACT PO Take 1 capsule by mouth daily as needed (cold symptoms).    [provider]  fluticasone (FLONASE) 50 MCG/ACT nasal spray Place 1 spray into both nostrils 2 (two) times daily. 12/12/20   Dara Hoyer, FNP  Magnesium 250 MG TABS Take 250 mg by mouth at bedtime.    [provider]  Melatonin 5 MG CAPS Take 10 mg by mouth at bedtime.    [provider]  nebivolol (BYSTOLIC) 10 MG tablet TAKE 1 TABLET BY MOUTH EVERY DAY IN THE MORNING Patient taking differently: Take 10 mg by mouth daily. 05/06/21   Tolia, Sunit, DO  Nutritional Supplements (JUICE PLUS FIBRE PO) Take 2 capsules by mouth daily.    [provider]  Polyethylene Glycol 3350 (MIRALAX PO) Take by mouth daily.    [provider]  Probiotic Product (PROBIOTIC DAILY PO) Take 1 capsule by mouth daily.    [provider]  valsartan-hydrochlorothiazide (DIOVAN-HCT) 320-12.5 MG tablet Take 1 tablet by mouth daily. 02/21/20   [provider]  zinc gluconate 50 MG tablet Take 50 mg by mouth daily as needed (supplement).    [provider]    Family History Family History  Problem Relation Age of Onset   Pancreatic cancer Mother    Prostate cancer Father     Social History Social History   Tobacco Use   Smoking status: Former    Years: 4.00    Types: Cigarettes    Quit date: 1973    Years since quitting: 50.1   Smokeless tobacco: Never  Vaping Use   Vaping Use: Never used  Substance Use Topics   Alcohol use:  Never   Drug use: Never     Allergies   Patient has no known allergies.   Review of Systems Review of Systems Pertinent negatives listed in HPI   Physical Exam Triage Vital Signs ED Triage Vitals [05/31/21 1420]  Enc Vitals Group     BP      Pulse      Resp      Temp      Temp src      SpO2      Weight      Height  Head Circumference      Peak Flow      Pain Score 0     Pain Loc      Pain Edu?      Excl. in [place]?    No data found.  Updated Vital Signs BP 135/77    Pulse 74    Temp 98 F (36.7 C)    Resp 18    SpO2 96%   Visual Acuity Right Eye Distance:   Left Eye Distance:   Bilateral Distance:    Right Eye Near:   Left Eye Near:    Bilateral Near:     Physical Exam  General Appearance:    Alert, cooperative, non-ill-appearing, without distress  HENT:   Normocephalic, ears normal, nares mucosal edema with congestion, absent of nasal discharge, oropharynx w/o swelling or exudate  Eyes:    PERRL, conjunctiva/corneas clear, EOM's intact       Lungs:     Clear to auscultation bilaterally, respirations unlabored  Heart:    Regular rate and rhythm  Neurologic:   Awake, alert, oriented x 3. No apparent focal neurological           defect.      UC Treatments / Results  Labs (all labs ordered are listed, but only abnormal results are displayed) Labs Reviewed  SARS CORONAVIRUS 2 (TAT 6-24 HRS)    EKG   Radiology No results found.  Procedures Procedures (including critical care time)  Medications Ordered in UC Medications  dexamethasone (DECADRON) injection 5 mg (5 mg Intramuscular Given 05/31/21 1441)    Initial Impression / Assessment and Plan / UC Course  I have reviewed the triage vital signs and the nursing notes.  Pertinent labs & imaging results that were available during my care of the patient were reviewed by me and considered in my medical decision making (see chart for details).    Allergic Rhinitis due to allergic  trigger Continue current allergy regimen as I suspect symptoms are likely related to environmental irritants.  COVID test pending given patient is scheduled for upcoming surgery and has URI symptoms. Keep follow-up with allergist.  RTC PRN Final Clinical Impressions(s) / UC Diagnoses   Final diagnoses:  Allergic rhinitis due to other allergic trigger, unspecified seasonality     Discharge Instructions      Continue allergy medications. Your COVID test will result by Monday and if any of your results are abnormal we will notify you. I suspect your symptoms are more allergy related.     ED Prescriptions   None    PDMP not reviewed this encounter.   Scot Jun, [place] 06/04/21 [phone]

## 2021-05-31 NOTE — ED Triage Notes (Addendum)
Pt reports a runny nose for 3 days and thinks he has a sinus infection. PT has surgery this up coming WED. And wants to make sure he does not have sinus infection.

## 2021-06-01 LAB — SARS CORONAVIRUS 2 (TAT 6-24 HRS): SARS Coronavirus 2: NEGATIVE

## 2021-06-03 ENCOUNTER — Encounter: Payer: Self-pay | Admitting: Allergy & Immunology

## 2021-06-03 ENCOUNTER — Other Ambulatory Visit: Payer: Self-pay

## 2021-06-03 ENCOUNTER — Ambulatory Visit: Payer: Medicare PPO | Admitting: Allergy & Immunology

## 2021-06-03 VITALS — BP 120/62 | HR 82 | Temp 98.9°F | Resp 12 | Ht 74.0 in | Wt 192.2 lb

## 2021-06-03 DIAGNOSIS — L299 Pruritus, unspecified: Secondary | ICD-10-CM

## 2021-06-03 DIAGNOSIS — J3089 Other allergic rhinitis: Secondary | ICD-10-CM | POA: Diagnosis not present

## 2021-06-03 MED ORDER — AZELASTINE HCL 0.1 % NA SOLN: 2.0000 | Freq: Two times a day (BID) | NASAL | 5 refills | Status: DC | PRN

## 2021-06-03 MED ORDER — DESLORATADINE 5 MG PO TABS: 5.0000 mg | ORAL_TABLET | Freq: Two times a day (BID) | ORAL | 2 refills | Status: DC

## 2021-06-03 NOTE — Patient Instructions (Addendum)
Allergic rhinitis (grasses, indoor molds, dust mites) - Start prednisone pack today to prevent the sneezing/allergy symptoms for the next week.  - Continue fluticasone 1 spray in each nostril twice a day as needed for stuffy nose.   - Continue azelastine 2 sprays in each nostril twice a day as needed for runny nose or itch. - Continue Clarinex 5 mg once a day as needed for runny nose or itch - Consider saline nasal rinses as needed for nasal symptoms.  - Consider starting allergy shots for long term control. - Information on allergy shots provided (this will be two separate injections weekly for a while).   2. Pruritus Continue a twice a day moisturizing routine Continue Clarinex once a day as needed for itch (as listed above)  3. Return in about 3 months (around 08/31/2021). Good luck with your surgery tomorrow!    Please inform us of any Emergency Department visits, hospitalizations, or changes in symptoms. Call us before going to the ED for breathing or allergy symptoms since we might be able to fit you in for a sick visit. Feel free to contact us anytime with any questions, problems, or concerns.  It was a pleasure to see you again today!  Websites that have reliable patient information: 1. American Academy of Asthma, Allergy, and Immunology: www.aaaai.org 2. Food Allergy Research and Education (FARE): foodallergy.org 3. Mothers of Asthmatics: http://www.asthmacommunitynetwork.org 4. American College of Allergy, Asthma, and Immunology: www.acaai.org   COVID-19 Vaccine Information can be found at: ShippingScam.co.uk For questions related to vaccine distribution or appointments, please email [email or web address] or call [phone].   We realize that you might be concerned about having an allergic reaction to the COVID19 vaccines. To help with that concern, WE ARE OFFERING THE COVID19 VACCINES IN OUR OFFICE! Ask the front  desk for dates!     Like [place] on [employer] and Instagram for our latest updates!      A healthy democracy works best when [employer] participate! Make sure you are registered to vote! If you have moved or changed any of your contact information, you will need to get this updated before voting!  In some cases, you MAY be able to register to vote online: [email or web address]      Allergy Shots   Allergies are the result of a chain reaction that starts in the immune system. Your immune system controls how your body defends itself. For instance, if you have an allergy to pollen, your immune system identifies pollen as an invader or allergen. Your immune system overreacts by producing antibodies called Immunoglobulin E (IgE). These antibodies travel to cells that release chemicals, causing an allergic reaction.  The concept behind allergy immunotherapy, whether it is received in the form of shots or tablets, is that the immune system can be desensitized to specific allergens that trigger allergy symptoms. Although it requires time and patience, the payback can be long-term relief.  How Do Allergy Shots Work?  Allergy shots work much like a vaccine. Your body responds to injected amounts of a particular allergen given in increasing doses, eventually developing a resistance and tolerance to it. Allergy shots can lead to decreased, minimal or no allergy symptoms.  There generally are two phases: build-up and maintenance. Build-up often ranges from three to six months and involves receiving injections with increasing amounts of the allergens. The shots are typically given once or twice a week, though more rapid build-up schedules are sometimes used.  The maintenance phase begins when the most effective dose  is reached. This dose is different for each person, depending on how allergic you are and your response to the build-up injections. Once the maintenance dose is  reached, there are longer periods between injections, typically two to four weeks.  Occasionally doctors give cortisone-type shots that can temporarily reduce allergy symptoms. These types of shots are different and should not be confused with allergy immunotherapy shots.  Who Can Be Treated with Allergy Shots?  Allergy shots may be a good treatment approach for people with allergic rhinitis (hay fever), allergic asthma, conjunctivitis (eye allergy) or stinging insect allergy.   Before deciding to begin allergy shots, you should consider:   The length of allergy season and the severity of your symptoms  Whether medications and/or changes to your environment can control your symptoms  Your desire to avoid long-term medication use  Time: allergy immunotherapy requires a major time commitment  Cost: may vary depending on your insurance coverage  Allergy shots for children age 54 and older are effective and often well tolerated. They might prevent the onset of new allergen sensitivities or the progression to asthma.  Allergy shots are not started on patients who are pregnant but can be continued on patients who become pregnant while receiving them. In some patients with other medical conditions or who take certain common medications, allergy shots may be of risk. It is important to mention other medications you talk to your allergist.   When Will I Feel Better?  Some may experience decreased allergy symptoms during the build-up phase. For others, it may take as long as 12 months on the maintenance dose. If there is no improvement after a year of maintenance, your allergist will discuss other treatment options with you.  If you arent responding to allergy shots, it may be because there is not enough dose of the allergen in your vaccine or there are missing allergens that were not identified during your allergy testing. Other reasons could be that there are high levels of the allergen in your  environment or major exposure to non-allergic triggers like tobacco smoke.  What Is the Length of Treatment?  Once the maintenance dose is reached, allergy shots are generally continued for three to five years. The decision to stop should be discussed with your allergist at that time. Some people may experience a permanent reduction of allergy symptoms. Others may relapse and a longer course of allergy shots can be considered.  What Are the Possible Reactions?  The two types of adverse reactions that can occur with allergy shots are local and systemic. Common local reactions include very mild redness and swelling at the injection site, which can happen immediately or several hours after. A systemic reaction, which is less common, affects the entire body or a particular body system. They are usually mild and typically respond quickly to medications. Signs include increased allergy symptoms such as sneezing, a stuffy nose or hives.  Rarely, a serious systemic reaction called anaphylaxis can develop. Symptoms include swelling in the throat, wheezing, a feeling of tightness in the chest, nausea or dizziness. Most serious systemic reactions develop within 30 minutes of allergy shots. This is why it is strongly recommended you wait in your doctors office for 30 minutes after your injections. Your allergist is trained to watch for reactions, and his or her staff is trained and equipped with the proper medications to identify and treat them.  Who Should Administer Allergy Shots?  The preferred location for receiving shots is your prescribing allergists  office. Injections can sometimes be given at another facility where the physician and staff are trained to recognize and treat reactions, and have received instructions by your prescribing allergist.

## 2021-06-03 NOTE — Anesthesia Preprocedure Evaluation (Addendum)
Anesthesia Evaluation  Patient identified by MRN, date of birth, ID band Patient awake    Reviewed: Allergy & Precautions, H&P , NPO status , Patient's Chart, lab work & pertinent test results  History of Anesthesia Complications Negative for: history of anesthetic complications  Airway Mallampati: II  TM Distance: >3 FB     Dental no notable dental hx. (+) Dental Advisory Given   Pulmonary former smoker,    Pulmonary exam normal        Cardiovascular hypertension, Pt. on home beta blockers and Pt. on medications Normal cardiovascular exam  Lexiscan Tetrofosmin Stress Test  05/29/2020: Nondiagnostic ECG stress. Myocardial perfusion is normal. Overall LV systolic function is normal without regional wall motion abnormalities. Stress LV EF: 63%.  No previous exam available for comparison. Low risk.    IMPRESSIONS    1. Left ventricular ejection fraction, by estimation, is 60 to 65%. The  left ventricle has normal function. The left ventricle has no regional  wall motion abnormalities. Left ventricular diastolic parameters were  normal.  2. Right ventricular systolic function is normal. The right ventricular  size is normal. There is normal pulmonary artery systolic pressure.  3. The mitral valve is normal in structure. Trivial mitral valve  regurgitation.  4. The aortic valve is tricuspid. Aortic valve regurgitation is mild to  moderate. No aortic stenosis is present.  5. Large simple cyst in the liver measuring 7.7x10.2 cm.   Comparison(s): Compared to prior study 06/04/2020: Moderate AR is now mild  to moderate otherwise no significant change. Liver cyst is seen again on  current study consider ultrasound of the liver/abdomen to further evaluate  if clinical indicated.    Neuro/Psych    GI/Hepatic GERD  ,  Endo/Other    Renal/GU      Musculoskeletal   Abdominal   Peds  Hematology   Anesthesia Other  Findings   Reproductive/Obstetrics                            Anesthesia Physical  Anesthesia Plan  ASA: 3  Anesthesia Plan: General   Post-op Pain Management:    Induction: Intravenous  PONV Risk Score and Plan: 4 or greater and Ondansetron, Dexamethasone, Treatment may vary due to age or medical condition and Diphenhydramine  Airway Management Planned: Oral ETT and LMA  Additional Equipment:   Intra-op Plan:   Post-operative Plan: Extubation in OR  Informed Consent: I have reviewed the patients History and Physical, chart, labs and discussed the procedure including the risks, benefits and alternatives for the proposed anesthesia with the patient or authorized representative who has indicated his/her understanding and acceptance.     Dental advisory given  Plan Discussed with: Anesthesiologist, Surgeon and CRNA  Anesthesia Plan Comments: ( )       Anesthesia Quick Evaluation

## 2021-06-03 NOTE — Progress Notes (Signed)
FOLLOW UP  Date of Service/Encounter:  06/03/21   Assessment:   Chronic rhinitis (grass, indoor molds, dust mites) - s/p 30 yrs allergen immunotherapy when he lived in [place], but now with worsening allergic rhinitis symptoms as of late   Itching - improved  Plan/Recommendations:   Allergic rhinitis (grasses, indoor molds, dust mites) - Start prednisone pack today to prevent the sneezing/allergy symptoms for the next week.  - Continue fluticasone 1 spray in each nostril twice a day as needed for stuffy nose.   - Continue azelastine 2 sprays in each nostril twice a day as needed for runny nose or itch. - Continue Clarinex 5 mg once a day as needed for runny nose or itch - Consider saline nasal rinses as needed for nasal symptoms.  - Consider starting allergy shots for long term control. - Information on allergy shots provided (this will be two separate injections weekly for a while).   2. Pruritus Continue a twice a day moisturizing routine Continue Clarinex once a day as needed for itch (as listed above)  3. Return in about 3 months (around 08/31/2021). Good luck with your surgery tomorrow!    Subjective:   Kenneth Daniel is a 76 y.o. male presenting today for follow up of  Chief Complaint  Patient presents with   Allergic Rhinitis     Couldn't stop sneezing and nose wouldn't stop running last week. Saturday went to walk in clinic. Gave him a steroid shot. It has turned around since the shot. Tomorrow has surgery for hernia.    Kenneth Daniel has a history of the following: Patient Active Problem List   Diagnosis Date Noted   Nonrheumatic aortic valve insufficiency    Perennial allergic rhinitis 12/12/2020   Pruritus 12/12/2020   Seasonal allergic conjunctivitis 12/12/2020   Acute pyelonephritis 09/22/2020   Pyelonephritis 09/22/2020   Myelopathy (Kenneth Daniel) 11/09/2019    History obtained from: chart review and patient.  Kenneth Daniel is a 76 y.o. male presenting for a  follow up visit.  He was last seen in August 2022.  At that time, Flonase 1 spray per nostril twice a day.  He was also continued on Astelin as well as Clarinex.  For his conjunctivitis, he was started on Pataday or Zaditor as needed.  For his itching, he was continued on twice daily moisturizing as well as Clarinex.  Since last visit, he is largely done well.  He had a steroid injection Urgent Care this past weekend. He was having issues with sneezing and he was having a lot of nasal drainage. He got a steroid shot and this cleared it up completely. Going outdoors cleared this up, so he started thinking that this might be something in the house. The sneezing mostly happened in the morning. When he wgot up and moved around, it got better.   He was on allergy shots before he moved up here. He was in [place] and took them for 30 years when he lived there.  He is open to trying these again, but was excited when he felt like he did not need them when he first moved here.  He had good results to them when he was down in [place].  Itching is largely improved.  As long as he takes Clarinex every day, he does fairly well from a pruritus standpoint.  Otherwise, there have been no changes to his past medical history, surgical history, family history, or social history.    Review of Systems  Constitutional: Negative.  Negative for fever, malaise/fatigue and weight loss.  HENT:  Positive for congestion. Negative for ear discharge and ear pain.        Positive for sneezing.  Positive for postnasal drip.  Eyes:  Negative for pain, discharge and redness.  Respiratory:  Negative for cough, sputum production, shortness of breath and wheezing.   Cardiovascular: Negative.  Negative for chest pain and palpitations.  Gastrointestinal:  Negative for abdominal pain, heartburn, nausea and vomiting.  Skin: Negative.  Negative for itching and rash.  Neurological:  Negative for dizziness and headaches.   Endo/Heme/Allergies:  Negative for environmental allergies. Does not bruise/bleed easily.      Objective:   Blood pressure 120/62, pulse 82, temperature 98.9 F (37.2 C), temperature source Temporal, resp. rate 12, height 6\' 2"  (1.88 m), weight 192 lb 3.2 oz (87.2 kg), SpO2 95 %. Body mass index is 24.68 kg/m.   Physical Exam:  Physical Exam Vitals reviewed.  Constitutional:      Appearance: He is well-developed.  HENT:     Head: Normocephalic and atraumatic.     Right Ear: External ear normal.     Left Ear: External ear normal.     Ears:     Comments: Hearing aids in place bilaterally.    Nose: Mucosal edema and rhinorrhea present. No nasal deformity or septal deviation.     Right Turbinates: Enlarged and swollen.     Left Turbinates: Enlarged and swollen.     Right Sinus: No maxillary sinus tenderness or frontal sinus tenderness.     Left Sinus: No maxillary sinus tenderness or frontal sinus tenderness.     Comments: Turbinates are erythematous.    Mouth/Throat:     Mouth: Mucous membranes are not pale and not dry.     Pharynx: Uvula midline.  Eyes:     General: Lids are normal. No allergic shiner.       Right eye: No discharge.        Left eye: No discharge.     Conjunctiva/sclera: Conjunctivae normal.     Right eye: Right conjunctiva is not injected. No chemosis.    Left eye: Left conjunctiva is not injected. No chemosis.    Pupils: Pupils are equal, round, and reactive to light.  Cardiovascular:     Rate and Rhythm: Normal rate and regular rhythm.     Heart sounds: Normal heart sounds.  Pulmonary:     Effort: Pulmonary effort is normal. No tachypnea, accessory muscle usage or respiratory distress.     Breath sounds: Normal breath sounds. No wheezing, rhonchi or rales.  Chest:     Chest wall: No tenderness.  Lymphadenopathy:     Cervical: No cervical adenopathy.  Skin:    General: Skin is warm.     Capillary Refill: Capillary refill takes less than 2  seconds.     Coloration: Skin is not pale.     Findings: No abrasion, erythema, petechiae or rash. Rash is not papular, urticarial or vesicular.     Comments: No eczematous or urticarial lesions noted.  Neurological:     Mental Status: He is alert.  Psychiatric:        Behavior: Behavior is cooperative.     Diagnostic studies: none       Salvatore Marvel, MD  [facility] [place] [facility] [place]

## 2021-06-04 ENCOUNTER — Encounter (HOSPITAL_BASED_OUTPATIENT_CLINIC_OR_DEPARTMENT_OTHER)
Admission: RE | Disposition: A | Discharge status: Home / Self Care | Payer: Self-pay | Source: Ambulatory Visit | Attending: Surgery

## 2021-06-04 ENCOUNTER — Encounter (HOSPITAL_BASED_OUTPATIENT_CLINIC_OR_DEPARTMENT_OTHER): Payer: Self-pay | Admitting: Surgery

## 2021-06-04 ENCOUNTER — Ambulatory Visit (HOSPITAL_BASED_OUTPATIENT_CLINIC_OR_DEPARTMENT_OTHER)
Admission: RE | Admit: 2021-06-04 | Discharge: 2021-06-04 | Disposition: A | Discharge status: Home / Self Care | Payer: Medicare PPO | Source: Ambulatory Visit | Attending: Surgery | Admitting: Surgery

## 2021-06-04 ENCOUNTER — Other Ambulatory Visit: Payer: Self-pay

## 2021-06-04 ENCOUNTER — Ambulatory Visit (HOSPITAL_BASED_OUTPATIENT_CLINIC_OR_DEPARTMENT_OTHER): Payer: Medicare PPO | Admitting: Anesthesiology

## 2021-06-04 DIAGNOSIS — Z87891 Personal history of nicotine dependence: Secondary | ICD-10-CM | POA: Diagnosis not present

## 2021-06-04 DIAGNOSIS — N433 Hydrocele, unspecified: Secondary | ICD-10-CM | POA: Insufficient documentation

## 2021-06-04 DIAGNOSIS — I351 Nonrheumatic aortic (valve) insufficiency: Secondary | ICD-10-CM | POA: Insufficient documentation

## 2021-06-04 DIAGNOSIS — Z8546 Personal history of malignant neoplasm of prostate: Secondary | ICD-10-CM | POA: Diagnosis not present

## 2021-06-04 DIAGNOSIS — K219 Gastro-esophageal reflux disease without esophagitis: Secondary | ICD-10-CM | POA: Insufficient documentation

## 2021-06-04 DIAGNOSIS — K4090 Unilateral inguinal hernia, without obstruction or gangrene, not specified as recurrent: Secondary | ICD-10-CM | POA: Diagnosis present

## 2021-06-04 DIAGNOSIS — E785 Hyperlipidemia, unspecified: Secondary | ICD-10-CM | POA: Diagnosis not present

## 2021-06-04 DIAGNOSIS — Z923 Personal history of irradiation: Secondary | ICD-10-CM | POA: Diagnosis not present

## 2021-06-04 DIAGNOSIS — I10 Essential (primary) hypertension: Secondary | ICD-10-CM | POA: Diagnosis not present

## 2021-06-04 HISTORY — DX: Personal history of malignant neoplasm of prostate: Z85.46

## 2021-06-04 HISTORY — DX: Other allergic rhinitis: J30.89

## 2021-06-04 HISTORY — DX: Other constipation: K59.09

## 2021-06-04 HISTORY — DX: Presence of dental prosthetic device (complete) (partial): Z97.2

## 2021-06-04 HISTORY — DX: Anemia, unspecified: D64.9

## 2021-06-04 HISTORY — PX: INGUINAL HERNIA REPAIR: SHX194

## 2021-06-04 HISTORY — DX: Unilateral inguinal hernia, without obstruction or gangrene, not specified as recurrent: K40.90

## 2021-06-04 HISTORY — DX: Gastro-esophageal reflux disease without esophagitis: K21.9

## 2021-06-04 HISTORY — DX: Presence of external hearing-aid: Z97.4

## 2021-06-04 HISTORY — DX: Nonrheumatic aortic (valve) insufficiency: I35.1

## 2021-06-04 HISTORY — DX: Personal history of irradiation: Z92.3

## 2021-06-04 LAB — POCT I-STAT, CHEM 8
BUN: 22 mg/dL (ref 8–23)
Calcium, Ion: 1.16 mmol/L (ref 1.15–1.40)
Chloride: 104 mmol/L (ref 98–111)
Creatinine, Ser: 1.1 mg/dL (ref 0.61–1.24)
Glucose, Bld: 95 mg/dL (ref 70–99)
HCT: 43 % (ref 39.0–52.0)
Hemoglobin: 14.6 g/dL (ref 13.0–17.0)
Potassium: 4 mmol/L (ref 3.5–5.1)
Sodium: 141 mmol/L (ref 135–145)
TCO2: 28 mmol/L (ref 22–32)

## 2021-06-04 SURGERY — REPAIR, HERNIA, INGUINAL, ADULT
Anesthesia: General | Site: Inguinal | Laterality: Right

## 2021-06-04 MED ORDER — ACETAMINOPHEN 325 MG PO TABS: 650.0000 mg | ORAL_TABLET | ORAL | Status: DC | PRN

## 2021-06-04 MED ORDER — FENTANYL CITRATE (PF) 100 MCG/2ML IJ SOLN: 25.0000 ug | INTRAMUSCULAR | Status: DC | PRN

## 2021-06-04 MED ORDER — SODIUM CHLORIDE 0.9% FLUSH: 3.0000 mL | Freq: Two times a day (BID) | INTRAVENOUS | Status: DC

## 2021-06-04 MED ORDER — BUPIVACAINE LIPOSOME 1.3 % IJ SUSP: 20.0000 mL | Freq: Once | INTRAMUSCULAR | Status: DC

## 2021-06-04 MED ORDER — DEXAMETHASONE SODIUM PHOSPHATE 10 MG/ML IJ SOLN
INTRAMUSCULAR | Status: AC
  Filled 2021-06-04: qty 1

## 2021-06-04 MED ORDER — CHLORHEXIDINE GLUCONATE 4 % EX LIQD: 60.0000 mL | Freq: Once | CUTANEOUS | Status: DC

## 2021-06-04 MED ORDER — AMISULPRIDE (ANTIEMETIC) 5 MG/2ML IV SOLN
10.0000 mg | Freq: Once | INTRAVENOUS | Status: AC | PRN
  Administered 2021-06-04: 10 mg via INTRAVENOUS

## 2021-06-04 MED ORDER — LIDOCAINE 2% (20 MG/ML) 5 ML SYRINGE
INTRAMUSCULAR | Status: DC | PRN
  Administered 2021-06-04: 100 mg via INTRAVENOUS

## 2021-06-04 MED ORDER — PROPOFOL 10 MG/ML IV BOLUS
INTRAVENOUS | Status: AC
  Filled 2021-06-04: qty 20

## 2021-06-04 MED ORDER — CEFAZOLIN SODIUM-DEXTROSE 2-4 GM/100ML-% IV SOLN
2.0000 g | INTRAVENOUS | Status: AC
  Administered 2021-06-04: 2 g via INTRAVENOUS

## 2021-06-04 MED ORDER — SODIUM CHLORIDE 0.9 % IV SOLN: 250.0000 mL | INTRAVENOUS | Status: DC | PRN

## 2021-06-04 MED ORDER — PHENYLEPHRINE 40 MCG/ML (10ML) SYRINGE FOR IV PUSH (FOR BLOOD PRESSURE SUPPORT)
PREFILLED_SYRINGE | INTRAVENOUS | Status: AC
  Filled 2021-06-04: qty 10

## 2021-06-04 MED ORDER — DEXAMETHASONE SODIUM PHOSPHATE 10 MG/ML IJ SOLN
INTRAMUSCULAR | Status: DC | PRN
  Administered 2021-06-04: 10 mg via INTRAVENOUS

## 2021-06-04 MED ORDER — ACETAMINOPHEN 500 MG PO TABS: 1000.0000 mg | ORAL_TABLET | Freq: Once | ORAL | Status: DC

## 2021-06-04 MED ORDER — ONDANSETRON HCL 4 MG/2ML IJ SOLN
INTRAMUSCULAR | Status: AC
  Filled 2021-06-04: qty 2

## 2021-06-04 MED ORDER — OXYCODONE HCL 5 MG PO TABS
5.0000 mg | ORAL_TABLET | Freq: Four times a day (QID) | ORAL | 0 refills | Status: AC | PRN
Start: 2021-06-04 — End: 2021-06-09

## 2021-06-04 MED ORDER — PROMETHAZINE HCL 25 MG/ML IJ SOLN: 6.2500 mg | INTRAMUSCULAR | Status: DC | PRN

## 2021-06-04 MED ORDER — 0.9 % SODIUM CHLORIDE (POUR BTL) OPTIME
TOPICAL | Status: DC | PRN
Start: 2021-06-04 — End: 2021-06-04
  Administered 2021-06-04: 500 mL

## 2021-06-04 MED ORDER — BUPIVACAINE LIPOSOME 1.3 % IJ SUSP
INTRAMUSCULAR | Status: DC | PRN
  Administered 2021-06-04: 20 mL

## 2021-06-04 MED ORDER — PROPOFOL 10 MG/ML IV BOLUS
INTRAVENOUS | Status: DC | PRN
Start: 2021-06-04 — End: 2021-06-04
  Administered 2021-06-04: 40 mg via INTRAVENOUS
  Administered 2021-06-04: 200 mg via INTRAVENOUS

## 2021-06-04 MED ORDER — CELECOXIB 200 MG PO CAPS
ORAL_CAPSULE | ORAL | Status: AC
  Filled 2021-06-04: qty 1

## 2021-06-04 MED ORDER — OXYCODONE HCL 5 MG PO TABS: 5.0000 mg | ORAL_TABLET | ORAL | Status: DC | PRN

## 2021-06-04 MED ORDER — ACETAMINOPHEN 325 MG RE SUPP: 650.0000 mg | RECTAL | Status: DC | PRN

## 2021-06-04 MED ORDER — CEFAZOLIN SODIUM-DEXTROSE 2-4 GM/100ML-% IV SOLN
INTRAVENOUS | Status: AC
  Filled 2021-06-04: qty 100

## 2021-06-04 MED ORDER — ACETAMINOPHEN 500 MG PO TABS
1000.0000 mg | ORAL_TABLET | ORAL | Status: AC
  Administered 2021-06-04: 1000 mg via ORAL

## 2021-06-04 MED ORDER — ONDANSETRON HCL 4 MG/2ML IJ SOLN
INTRAMUSCULAR | Status: DC | PRN
  Administered 2021-06-04: 4 mg via INTRAVENOUS

## 2021-06-04 MED ORDER — BUPIVACAINE-EPINEPHRINE 0.25% -1:200000 IJ SOLN
INTRAMUSCULAR | Status: DC | PRN
  Administered 2021-06-04: 30 mL

## 2021-06-04 MED ORDER — PHENYLEPHRINE 40 MCG/ML (10ML) SYRINGE FOR IV PUSH (FOR BLOOD PRESSURE SUPPORT)
PREFILLED_SYRINGE | INTRAVENOUS | Status: DC | PRN
  Administered 2021-06-04 (×5): 80 ug via INTRAVENOUS
  Administered 2021-06-04: 40 ug via INTRAVENOUS
  Administered 2021-06-04 (×3): 80 ug via INTRAVENOUS

## 2021-06-04 MED ORDER — FENTANYL CITRATE (PF) 100 MCG/2ML IJ SOLN
INTRAMUSCULAR | Status: AC
  Filled 2021-06-04: qty 2

## 2021-06-04 MED ORDER — LIDOCAINE HCL (PF) 2 % IJ SOLN
INTRAMUSCULAR | Status: AC
  Filled 2021-06-04: qty 5

## 2021-06-04 MED ORDER — GABAPENTIN 300 MG PO CAPS
ORAL_CAPSULE | ORAL | Status: AC
  Filled 2021-06-04: qty 1

## 2021-06-04 MED ORDER — GABAPENTIN 300 MG PO CAPS
300.0000 mg | ORAL_CAPSULE | ORAL | Status: AC
  Administered 2021-06-04: 300 mg via ORAL

## 2021-06-04 MED ORDER — SODIUM CHLORIDE 0.9% FLUSH: 3.0000 mL | INTRAVENOUS | Status: DC | PRN

## 2021-06-04 MED ORDER — FENTANYL CITRATE (PF) 100 MCG/2ML IJ SOLN
INTRAMUSCULAR | Status: DC | PRN
  Administered 2021-06-04 (×2): 50 ug via INTRAVENOUS
  Administered 2021-06-04: 25 ug via INTRAVENOUS

## 2021-06-04 MED ORDER — AMISULPRIDE (ANTIEMETIC) 5 MG/2ML IV SOLN
INTRAVENOUS | Status: AC
  Filled 2021-06-04: qty 4

## 2021-06-04 MED ORDER — CELECOXIB 200 MG PO CAPS
200.0000 mg | ORAL_CAPSULE | Freq: Once | ORAL | Status: AC
  Administered 2021-06-04: 200 mg via ORAL

## 2021-06-04 MED ORDER — LACTATED RINGERS IV SOLN: INTRAVENOUS | Status: DC

## 2021-06-04 MED ORDER — ACETAMINOPHEN 500 MG PO TABS
ORAL_TABLET | ORAL | Status: AC
  Filled 2021-06-04: qty 2

## 2021-06-04 SURGICAL SUPPLY — 47 items
BENZOIN TINCTURE PRP APPL 2/3 (GAUZE/BANDAGES/DRESSINGS) ×2 IMPLANT
BLADE SURG 15 STRL LF DISP TIS (BLADE) ×2 IMPLANT
BLADE SURG 15 STRL SS (BLADE) ×2
CHLORAPREP W/TINT 26 (MISCELLANEOUS) ×2 IMPLANT
COVER BACK TABLE 60X90IN (DRAPES) ×2 IMPLANT
COVER MAYO STAND STRL (DRAPES) ×2 IMPLANT
DECANTER SPIKE VIAL GLASS SM (MISCELLANEOUS) ×1 IMPLANT
DRAIN PENROSE 0.5X18 (DRAIN) ×2 IMPLANT
DRAPE LAPAROTOMY TRNSV 102X78 (DRAPES) ×2 IMPLANT
DRAPE UTILITY XL STRL (DRAPES) ×2 IMPLANT
GAUZE 4X4 16PLY ~~LOC~~+RFID DBL (SPONGE) ×2 IMPLANT
GAUZE SPONGE 4X4 12PLY STRL (GAUZE/BANDAGES/DRESSINGS) ×2 IMPLANT
GLOVE SURG ENC MOIS LTX SZ6 (GLOVE) ×2 IMPLANT
GLOVE SURG ENC MOIS LTX SZ7 (GLOVE) ×1 IMPLANT
GLOVE SURG ENC MOIS LTX SZ7.5 (GLOVE) ×1 IMPLANT
GLOVE SURG POLYISO LF SZ6.5 (GLOVE) ×1 IMPLANT
GLOVE SURG UNDER POLY LF SZ6.5 (GLOVE) ×2 IMPLANT
GLOVE SURG UNDER POLY LF SZ7 (GLOVE) ×1 IMPLANT
GLOVE SURG UNDER POLY LF SZ7.5 (GLOVE) ×1 IMPLANT
GOWN STRL REUS W/TWL LRG LVL3 (GOWN DISPOSABLE) ×3 IMPLANT
GOWN STRL REUS W/TWL XL LVL3 (GOWN DISPOSABLE) ×1 IMPLANT
KIT TURNOVER CYSTO (KITS) ×2 IMPLANT
MESH ULTRAPRO 3X6 7.6X15CM (Mesh General) ×1 IMPLANT
NEEDLE HYPO 22GX1.5 SAFETY (NEEDLE) ×2 IMPLANT
NS IRRIG 500ML POUR BTL (IV SOLUTION) ×1 IMPLANT
PACK BASIN DAY SURGERY FS (CUSTOM PROCEDURE TRAY) ×2 IMPLANT
PENCIL SMOKE EVACUATOR (MISCELLANEOUS) ×2 IMPLANT
SPONGE T-LAP 4X18 ~~LOC~~+RFID (SPONGE) ×2 IMPLANT
STRIP CLOSURE SKIN 1/2X4 (GAUZE/BANDAGES/DRESSINGS) ×2 IMPLANT
SUT ETHIBOND 0 (SUTURE) IMPLANT
SUT ETHIBOND 0 MO6 C/R (SUTURE) ×2 IMPLANT
SUT MNCRL AB 4-0 PS2 18 (SUTURE) ×2 IMPLANT
SUT PDS AB 2-0 CT2 27 (SUTURE) IMPLANT
SUT PROLENE 2 0 CT2 30 (SUTURE) IMPLANT
SUT VIC AB 0 CT1 27 (SUTURE) ×1
SUT VIC AB 0 CT1 27XBRD ANBCTR (SUTURE) ×1 IMPLANT
SUT VIC AB 3-0 SH 27 (SUTURE) ×2
SUT VIC AB 3-0 SH 27X BRD (SUTURE) ×2 IMPLANT
SUT VICRYL AB 3 0 TIES (SUTURE) ×2 IMPLANT
SYR 10ML LL (SYRINGE) ×2 IMPLANT
SYR BULB IRRIG 60ML STRL (SYRINGE) ×2 IMPLANT
SYR CONTROL 10ML LL (SYRINGE) ×2 IMPLANT
TAPE HYPAFIX 4 X10 (GAUZE/BANDAGES/DRESSINGS) ×1 IMPLANT
TOWEL OR 17X26 10 PK STRL BLUE (TOWEL DISPOSABLE) ×3 IMPLANT
TRAY FOL W/BAG SLVR 16FR STRL (SET/KITS/TRAYS/PACK) ×1 IMPLANT
TRAY FOLEY W/BAG SLVR 16FR LF (SET/KITS/TRAYS/PACK)
YANKAUER SUCT BULB TIP NO VENT (SUCTIONS) ×1 IMPLANT

## 2021-06-04 NOTE — Anesthesia Procedure Notes (Signed)
Procedure Name: LMA Insertion Date/Time: 06/04/2021 9:45 AM Performed by: Rogers Blocker, CRNA Pre-anesthesia Checklist: Patient identified, Emergency Drugs available, Suction available and Patient being monitored Patient Re-evaluated:Patient Re-evaluated prior to induction Oxygen Delivery Method: Circle System Utilized Preoxygenation: Pre-oxygenation with 100% oxygen Induction Type: IV induction Ventilation: Mask ventilation without difficulty LMA: LMA inserted LMA Size: 5.0 Number of attempts: 1 Airway Equipment and Method: Bite block Placement Confirmation: positive ETCO2 Tube secured with: Tape Dental Injury: Teeth and Oropharynx as per pre-operative assessment

## 2021-06-04 NOTE — Anesthesia Postprocedure Evaluation (Signed)
Anesthesia Post Note  Patient: Kenneth Daniel  Procedure(s) Performed: OPEN RIGHT INGUINAL HERNIA REPAIR (Right: Inguinal)     Patient location during evaluation: PACU Anesthesia Type: General Level of consciousness: sedated Pain management: pain level controlled Vital Signs Assessment: post-procedure vital signs reviewed and stable Respiratory status: spontaneous breathing and respiratory function stable Cardiovascular status: stable Postop Assessment: no apparent nausea or vomiting Anesthetic complications: yes   Encounter Notable Events  Notable Event Outcome Phase Comment  Nausea Resolved in Lab Intraprocedure     Last Vitals:  Vitals:   06/04/21 1200 06/04/21 1237  BP: (!) 144/86 126/86  Pulse: 77 83  Resp: 10 12  Temp:  36.6 C  SpO2: 94% 97%    Last Pain:  Vitals:   06/04/21 1237  TempSrc:   PainSc: 0-No pain                 [name]

## 2021-06-04 NOTE — Transfer of Care (Signed)
Immediate Anesthesia Transfer of Care Note  Patient: Kenneth Daniel  Procedure(s) Performed: OPEN RIGHT INGUINAL HERNIA REPAIR (Right: Inguinal)  Patient Location: PACU  Anesthesia Type:General  Level of Consciousness: awake, alert , oriented and patient cooperative  Airway & Oxygen Therapy: Patient Spontanous Breathing and Patient connected to nasal cannula oxygen  Post-op Assessment: Report given to RN and Post -op Vital signs reviewed and stable  Post vital signs: Reviewed and stable  Last Vitals:  Vitals Value Taken Time  BP    Temp    Pulse    Resp    SpO2      Last Pain:  Vitals:   06/04/21 0907  TempSrc: Oral  PainSc: 0-No pain      Patients Stated Pain Goal: 4 (04/21/74 8832)  Complications: No notable events documented.

## 2021-06-04 NOTE — Op Note (Signed)
Operative Note  Kenneth Daniel  [id number]  [id number]  06/04/2021   Surgeon: Clovis Riley MD FACS   Assistant: Candida Peeling MD ([place]) I was personally present during the key and critical portions of this procedure and immediately available throughout the entire procedure, as documented in my operative note.    Procedure performed: Open right inguinal hernia repair with mesh   Preop diagnosis:  right inguinal hernia   Post-op diagnosis/intraop findings: Indirect right inguinal hernia, small noncommunicating hydrocele, gonadal vessels and vas inseparable from and essentially within the peritoneum along the inferior aspect of the sac   Specimens: none   EBL: 5cc   Complications: none   Description of procedure: After obtaining informed consent the patient was taken to the operating room and placed supine on operating room table where general anesthesia was initiated, preoperative antibiotics were administered, SCDs applied, and a formal timeout was performed. The groin was prepped and draped in the usual sterile fashion. An oblique incision was made in the just above the inguinal ligament after infiltrating the tissues with local anesthetic (Exparel mixed with 0.25% Marcaine). Soft tissues were dissected using electrocautery until the external oblique aponeurosis was encountered. This was divided sharply to expand the external ring. A plane was bluntly developed between the spermatic cord and the external oblique. The spermatic cord was then bluntly dissected away from the pubic tubercle and encircled with a Penrose.  There was clearly an indirect hernia.  No direct hernia was present.  There is a small hydrocele which did tear expressing clear simple fluid.  This did not appear to communicate with the peritoneal cavity.  The cord structures were almost inseparable from the hernia sac.  The hernia sac was opened to help clarify the anatomy and confirmed to communicate with the peritoneal  cavity.  Bowel was visible.  A careful and tedious dissection ensued to try and separate the hernia sac from the vas and testicular artery and vein and we were able to free the vessels to the level of the internal ring but the vas appeared to be almost part of the inferior border of the hernia sac could not really be separated.  This was divided.  The hernia sac was then closed with a running 0 Vicryl and the excess excised at the level of the internal ring where the ligated peritoneum was reduced in the abdomen. A 3 x 6 piece of ultra Pro mesh was brought onto the field and trimmed to approximate the field. This was sutured to the pubic tubercle fascia using 0 ethibond. Interrupted 0 ethibonds were then used to suture the mesh to the inferior shelving edge and to the internal oblique superiorly. The tails of the mesh were wrapped around the spermatic cord, ensuring adequate room for the cord, and sutured to each other with 0 ethibond, and then directed laterally to lie flat.  An additional 0 Ethibond was used to narrow the mesh medial to the cord.  Hemostasis was ensured within the wound. The Penrose was removed. The external oblique aponeurosis was reapproximated with a running 3-0 Vicryl to re-create a narrowed external ring. More local was infiltrated around the pubic tubercle and in the plane just below the external oblique. The Scarpa's was reapproximated with interrupted 3-0 Vicryls. The skin was closed with a running subcuticular Monocryl. The remainder of the local was injected in the subcutaneous and subcuticular space. The field was then cleaned, benzoin and Steri-Strips and sterile bandage were applied. Both testicles were palpated in  the scrotum at the end of the case. The patient was then awakened extubated and taken to PACU in stable condition.    All counts were correct at the completion of the case

## 2021-06-04 NOTE — H&P (Signed)
Cottrell Gentles [id number]    Referring Provider:  Sharrell Ku, MD     Subjective    Chief Complaint: New Consultation (Hernia )       History of Present Illness: 76 year old gentleman with history of aortic regurgitation, carotid stenosis, hypertension, hyperlipidemia, prostate cancer status post robotic prostatectomy in 2004 (subsequently radiation in 2015 and currently on hormone tx)  and acid reflux who presents for evaluation of a nonrecurrent reducible right inguinal hernia. He first noticed this around the beginning of October.  He had C3-4 surgery for arthritis previously and was initiating his gym activities following surgery when he noticed this.  He was evaluated by his urologist Dr. Milford Cage and diagnosed with a hernia and plans were made to repair this in the future.  It has increased in size slightly.  Does not really cause any significant pain, but he does note constipation which he treats with over-the-counter MiraLAX.  No obstructive symptoms or weight loss.   His cardiologist is Dr. Terri Skains at [facility].  Primary care doctor is Dr. Lucianne Lei.  Echocardiogram February of this year demonstrates preserved ejection fraction with mild to moderate valvular heart disease, nuclear stress test cannulated this year was low risk study, and carotid duplex February of this year noted stenosis less than 50% on the right side.     Review of Systems: A complete review of systems was obtained from the patient.  I have reviewed this information and discussed as appropriate with the patient.  See HPI as well for other ROS.     Medical History:     Past Medical History:  Diagnosis Date   Anxiety     Hyperlipidemia     Hypertension           Patient Active Problem List  Diagnosis   Acute pyelonephritis   Pyelonephritis   Myelopathy (CMS-HCC)   Nonrheumatic aortic valve insufficiency   Perennial allergic rhinitis   Pruritus   Seasonal allergic conjunctivitis    Sensorineural hearing loss (SNHL), bilateral   Sore throat, unspecified           Past Surgical History:  Procedure Laterality Date   Neck Surgery    2021   Prostate Surgery    2004      No Known Allergies         Current Outpatient Medications on File Prior to Visit  Medication Sig Dispense Refill   amLODIPine (NORVASC) 10 MG tablet Take 1 tablet (10 mg total) by mouth once daily       azelastine (ASTELIN) 137 mcg nasal spray Place 2 sprays into one nostril 2 (two) times daily       famotidine (PEPCID) 20 MG tablet Take 1 tablet (20 mg total) by mouth at bedtime as needed       nebivoloL (BYSTOLIC) 20 mg tablet Take 1 tablet (20 mg total) by mouth once daily       valsartan-hydrochlorothiazide (DIOVAN-HCT) 320-12.5 mg tablet Take 1 tablet by mouth once daily       atorvastatin (LIPITOR) 40 MG tablet Take 1 tablet (40 mg total) by mouth at bedtime        No current facility-administered medications on file prior to visit.      History reviewed. No pertinent family history.    Social History        Tobacco Use  Smoking Status Former   Types: Cigarettes   Quit date: 1973   Years since quitting: 49.9  Smokeless Tobacco  Never      Social History         Socioeconomic History   Marital status: Single  Tobacco Use   Smoking status: Former      Types: Cigarettes      Quit date: 1973      Years since quitting: 49.9   Smokeless tobacco: Never  Vaping Use   Vaping Use: Never used  Substance and Sexual Activity   Alcohol use: Not Currently   Drug use: Never      Objective:         Vitals:    04/16/21 1110  BP: (!) 140/60  Pulse: 99  Temp: 36.8 C (98.3 F)  SpO2: 96%  Weight: 85.5 kg (188 lb 6.4 oz)  Height: 188 cm (6\' 2" )    Body mass index is 24.19 kg/m.   Alert and well-appearing Unlabored respirations Abdomen soft and nontender.  There is a large partially reducible right inguinal hernia without tenderness.   Assessment and Plan:  Diagnoses and  all orders for this visit:   Inguinal hernia without obstruction or gangrene, recurrence not specified, unspecified laterality   Right inguinal hernia.  This is partially incarcerated.  Recommend proceeding with an open repair. We discussed the relevant anatomy and we discussed the technique of the procedure.  Discussed risks of bleeding, infection, pain, scarring, injury to structures in the area including nerves, blood vessels, bowel, bladder, risk of chronic pain, hernia recurrence, risk of seroma or hematoma, urinary retention, and risks of general anesthesia including cardiovascular, pulmonary, and thromboembolic complications.  Questions were answered.  Patient wishes to proceed with scheduling.  We will request cardiac clearance preoperatively.     Mannix Kroeker Raquel James, MD

## 2021-06-04 NOTE — Discharge Instructions (Addendum)
No acetaminophen/Tylenol until after 3:00pm today if needed for pain.   No ibuprofen, Advil, Aleve, Motrin, ketorolac, meloxicam, naproxen, or other NSAIDS until after 3:00pm today if needed for pain.      HERNIA REPAIR: POST OP INSTRUCTIONS   EAT Gradually transition to a high fiber diet with a fiber supplement over the next few weeks after discharge.  Start with a pureed / full liquid diet (see below)  WALK Walk an hour a day (cumulative- not all at once).  Control your pain to do that.    CONTROL PAIN Control pain so that you can walk, sleep, tolerate sneezing/coughing, and go up/down stairs.  HAVE A BOWEL MOVEMENT DAILY Keep your bowels regular to avoid problems.  OK to try a laxative to override constipation.  OK to use an antidairrheal to slow down diarrhea.  Call if not better after 2 tries  CALL IF YOU HAVE PROBLEMS/CONCERNS Call if you are still struggling despite following these instructions. Call if you have concerns not answered by these instructions  ######################################################################    DIET: Follow a light bland diet & liquids the first 24 hours after arrival home, such as soup, liquids, starches, etc.  Be sure to drink plenty of fluids.  Quickly advance to a usual solid diet within a few days.  Avoid fast food or heavy meals as your are more likely to get nauseated or have irregular bowels.  A low-sugar, high-fiber diet for the rest of your life is ideal.   Take your usually prescribed home medications unless otherwise directed.  PAIN CONTROL: Pain is best controlled by a usual combination of three different methods TOGETHER: Ice/Heat Over the counter pain medication Prescription pain medication Most patients will experience some swelling and bruising around the hernia(s) such as the bellybutton, groins, or old incisions.  Ice packs or heating pads (30-60 minutes up to 6 times a day) will help. Use ice for the first few days  to help decrease swelling and bruising, then switch to heat to help relax tight/sore spots and speed recovery.  Some people prefer to use ice alone, heat alone, alternating between ice & heat.  Experiment to what works for you.  Swelling and bruising can take several weeks to resolve.   It is helpful to take an over-the-counter pain medication regularly for the first days: Naproxen (Aleve, etc)  Two 220mg  tabs twice a day OR Ibuprofen (Advil, etc) Three 200mg  tabs four times a day (every meal & bedtime) AND Acetaminophen (Tylenol, etc) 325-650mg  four times a day (every meal & bedtime) A  prescription for pain medication should be given to you upon discharge.  Take your pain medication as prescribed, IF NEEDED.  If you are having problems/concerns with the prescription medicine (does not control pain, nausea, vomiting, rash, itching, etc), please call us [phone] to see if we need to switch you to a different pain medicine that will work better for you and/or control your side effect better. If you need a refill on your pain medication, please contact your pharmacy.  They will contact our office to request authorization. Prescriptions will not be filled after 5 pm or on week-ends.  Avoid getting constipated.  Between the surgery and the pain medications, it is common to experience some constipation.  Increasing fluid intake and taking a fiber supplement (such as Metamucil, Citrucel, FiberCon, MiraLax, etc) 1-2 times a day regularly will usually help prevent this problem from occurring.  A mild laxative (prune juice, Milk of Magnesia, MiraLax,  etc) should be taken according to package directions if there are no bowel movements after 48 hours.    Wash / shower every day, starting 2 days after surgery.  You may shower over the steri strips which are waterproof.    Remove your outer bandage 2 days after surgery.  You may leave the incision open to air.  You may replace a dressing/Band-Aid to cover an  incision for comfort if you wish.  Continue to shower over incision(s) after the dressing is off.  ACTIVITIES as tolerated:   You may resume regular (light) daily activities beginning the next day--such as daily self-care, walking, climbing stairs--gradually increasing activities as tolerated.  Control your pain so that you can walk an hour a day.  If you can walk 30 minutes without difficulty, it is safe to try more intense activity such as jogging, treadmill, bicycling, low-impact aerobics, swimming, etc. Refrain from the most intensive and strenuous activity such as sit-ups, heavy lifting, contact sports, etc  Refrain from any heavy lifting or straining until 6 weeks after surgery.   DO NOT PUSH THROUGH PAIN.  Let pain be your guide: If it hurts to do something, don't do it.  Pain is your body warning you to avoid that activity for another week until the pain goes down. You may drive when you are no longer taking prescription pain medication, you can comfortably wear a seatbelt, and you can safely maneuver your car and apply brakes. You may have sexual intercourse when it is comfortable.   FOLLOW UP in our office Please call [facility] at [phone] to set up an appointment to see your surgeon in the office for a follow-up appointment approximately 2-3 weeks after your surgery. Make sure that you call for this appointment the day you arrive home to insure a convenient appointment time.  9.  If you have disability of FMLA / Family leave forms, please bring the forms to the office for processing.  (do not give to your surgeon).  WHEN TO CALL us [phone]: Poor pain control Reactions / problems with new medications (rash/itching, nausea, etc)  Fever over 101.5 F (38.5 C) Inability to urinate Nausea and/or vomiting Worsening swelling or bruising Continued bleeding from incision. Increased pain, redness, or drainage from the incision   The clinic staff is available to answer your questions  during regular business hours (8:30am-5pm).  Please dont hesitate to call and ask to speak to one of our nurses for clinical concerns.   If you have a medical emergency, go to the nearest emergency room or call 911.  A surgeon from [facility] is always on call at the hospitals in [facility], [place], [place], [place], [place]  [address] ?  [address] [address], [place],    [address] MAIN: [phone] ? TOLL FREE: [phone] ? FAX: [phone] [email or web address]     Post Anesthesia Home Care Instructions  Activity: Get plenty of rest for the remainder of the day. A responsible individual must stay with you for 24 hours following the procedure.  For the next 24 hours, DO NOT: -Drive a car -Paediatric nurse -Drink alcoholic beverages -Take any medication unless instructed by your physician -Make any legal decisions or sign important papers.  Meals: Start with liquid foods such as gelatin or soup. Progress to regular foods as tolerated. Avoid greasy, spicy, heavy foods. If nausea and/or vomiting occur, drink only clear liquids until the nausea and/or vomiting subsides.  Call your physician if vomiting continues.  Special Instructions/Symptoms: Your throat may feel dry or sore from the anesthesia or the breathing tube placed in your throat during surgery. If this causes discomfort, gargle with warm salt water. The discomfort should disappear within 24 hours.       Information for Discharge Teaching: EXPAREL (bupivacaine liposome injectable suspension)   Your surgeon or anesthesiologist gave you EXPAREL(bupivacaine) to help control your pain after surgery.  EXPAREL is a local anesthetic that provides pain relief by numbing the tissue around the surgical site. EXPAREL is designed to release pain medication over time and can control pain for up to 72 hours. Depending on how you respond to EXPAREL, you may require  less pain medication during your recovery.  Possible side effects: Temporary loss of sensation or ability to move in the area where bupivacaine was injected. Nausea, vomiting, constipation Rarely, numbness and tingling in your mouth or lips, lightheadedness, or anxiety may occur. Call your doctor right away if you think you may be experiencing any of these sensations, or if you have other questions regarding possible side effects.  Follow all other discharge instructions given to you by your surgeon or nurse. Eat a healthy diet and drink plenty of water or other fluids.  If you return to the hospital for any reason within 96 hours following the administration of EXPAREL, it is important for health care providers to know that you have received this anesthetic. A teal colored band has been placed on your arm with the date, time and amount of EXPAREL you have received in order to alert and inform your health care providers. Please leave this armband in place for the full 96 hours following administration, and then you may remove the band.    Band can be removed on Sunday June 08, 2021.

## 2021-06-06 ENCOUNTER — Encounter (HOSPITAL_BASED_OUTPATIENT_CLINIC_OR_DEPARTMENT_OTHER): Payer: Self-pay | Admitting: Surgery

## 2021-06-12 ENCOUNTER — Other Ambulatory Visit: Payer: Self-pay

## 2021-06-12 ENCOUNTER — Ambulatory Visit: Payer: Medicare PPO

## 2021-06-12 DIAGNOSIS — I6521 Occlusion and stenosis of right carotid artery: Secondary | ICD-10-CM

## 2021-06-19 NOTE — Progress Notes (Signed)
Called and spoke with patient, he confirmed his appointment for the 1st of March.

## 2021-07-02 ENCOUNTER — Ambulatory Visit: Payer: Medicare PPO | Admitting: Cardiology

## 2021-07-02 ENCOUNTER — Other Ambulatory Visit: Payer: Self-pay

## 2021-07-02 ENCOUNTER — Encounter: Payer: Self-pay | Admitting: Cardiology

## 2021-07-02 VITALS — BP 123/69 | HR 90 | Temp 97.9°F | Resp 16 | Ht 74.0 in | Wt 190.0 lb

## 2021-07-02 DIAGNOSIS — I6521 Occlusion and stenosis of right carotid artery: Secondary | ICD-10-CM

## 2021-07-02 DIAGNOSIS — I10 Essential (primary) hypertension: Secondary | ICD-10-CM

## 2021-07-02 DIAGNOSIS — Z8616 Personal history of COVID-19: Secondary | ICD-10-CM

## 2021-07-02 DIAGNOSIS — E7800 Pure hypercholesterolemia, unspecified: Secondary | ICD-10-CM

## 2021-07-02 DIAGNOSIS — I351 Nonrheumatic aortic (valve) insufficiency: Secondary | ICD-10-CM

## 2021-07-02 DIAGNOSIS — Z87891 Personal history of nicotine dependence: Secondary | ICD-10-CM

## 2021-07-02 NOTE — Progress Notes (Signed)
ID:  Kenneth Daniel, DOB Jan 20, 1946, MRN [id number]  PCP:  Lucianne Lei, MD  Cardiologist:  Rex Kras, DO, [facility] (established care 05/22/2020) Former Cardiology Providers: Dr. Orie Rout [facility])  Date: 07/03/21 Last Office Visit: 01/01/2021  Chief Complaint  Patient presents with   Results   Follow-up    6 months    HPI  Kenneth Daniel is a 76 y.o. male who presents to the office with a chief complaint of " 6 month follow up for valvular heart disease and carotid disease." Patient's past medical history and cardiovascular risk factors include: Prostate Cancer (s/p surgery, radiation,  and hormone tx), Hypertension, Hyperlipidemia, asymptomatic right ICA stenosis, aortic regurgitation, Hx of COVID infection, advanced age, former smoker.  Initially referred to the practice for evaluation of decreased physical endurance given his multiple cardiovascular risk factors.  He underwent an echocardiogram and stress test results noted below for further reference.  Patient also underwent carotid duplex which noted right ICA stenosis less than 50%.  He presents today for 41-monthfollow-up visit.  Over the past 6 months patient underwent hernia surgery and has done well postsurgery.  His overall functional status remains relatively stable.  He exercises daily and goes to the gym twice a week for resistance training.  He denies anginal discomfort or heart failure symptoms.  His repeat carotid duplex has noted significant improvement in bilateral carotid artery atherosclerosis as noted below.  FUNCTIONAL STATUS: Currently in physical therapy.    ALLERGIES: No Known Allergies  MEDICATION LIST PRIOR TO VISIT: Current Meds  Medication Sig   acetaminophen (TYLENOL) 500 MG tablet Take 1,000 mg by mouth every 8 (eight) hours as needed for moderate pain.   aluminum hydroxide-magnesium carbonate (GAVISCON) 95-358 MG/15ML SUSP Take 30 mLs by mouth as needed for indigestion or heartburn.   amLODipine  (NORVASC) 10 MG tablet Take 10 mg by mouth daily.   ascorbic acid (VITAMIN C) 500 MG tablet Take 500 mg by mouth daily.   aspirin 81 MG EC tablet Take 81 mg by mouth at bedtime.   atorvastatin (LIPITOR) 40 MG tablet Take 40 mg by mouth at bedtime.   azelastine (ASTELIN) 0.1 % nasal spray Place 2 sprays into both nostrils 2 (two) times daily as needed for rhinitis.   b complex vitamins tablet Take 1 tablet by mouth daily as needed.   cholecalciferol (VITAMIN D3) 25 MCG (1000 UNIT) tablet Take 1,000 Units by mouth daily.   Coenzyme Q10-levOCARNitine (CO Q-10 PLUS PO) Take 1 tablet by mouth daily at 12 noon.   desloratadine (CLARINEX) 5 MG tablet Take 1 tablet (5 mg total) by mouth in the morning and at bedtime. once a day as needed for runny nose or itch   ECHINACEA EXTRACT PO Take 1 capsule by mouth daily as needed (cold symptoms).   famotidine (PEPCID) 40 MG tablet Take 40 mg by mouth daily.   fluticasone (FLONASE) 50 MCG/ACT nasal spray Place 1 spray into both nostrils 2 (two) times daily.   Magnesium 250 MG TABS Take 250 mg by mouth at bedtime.   Melatonin 5 MG CAPS Take 10 mg by mouth at bedtime.   nebivolol (BYSTOLIC) 10 MG tablet TAKE 1 TABLET BY MOUTH EVERY DAY IN THE MORNING (Patient taking differently: Take 10 mg by mouth daily.)   Nutritional Supplements (JUICE PLUS FIBRE PO) Take 2 capsules by mouth daily.   Polyethylene Glycol 3350 (MIRALAX PO) Take by mouth daily.   Probiotic Product (PROBIOTIC DAILY PO) Take  1 capsule by mouth daily.   valsartan-hydrochlorothiazide (DIOVAN-HCT) 320-12.5 MG tablet Take 1 tablet by mouth daily.   zinc gluconate 50 MG tablet Take 50 mg by mouth daily as needed (supplement).     PAST MEDICAL HISTORY: 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, right    per last duplex in epic 06-04-2020  right ICA 16-49% and right ECA >50%   Chronic constipation    GERD (gastroesophageal reflux disease)     History of acute pyelonephritis 09/2020   w/ admission in epic due to severe sepsis   History of COVID-19 11/18/2020   positive result in epic;   per pt mild to moderate symptoms that resolved   History of external beam radiation therapy    completed in 2015 for recurrent prostate cancer (done in [facility])   History of prostate cancer    (current urologist-- dr Milford Cage, and current treatment lupron injeciton q 27month) ;   [place]  s/p [place] prostate[place];   recurrence 2015 completed radiation   HLD (hyperlipidemia)    Hypertension    followed by pcp and cardiology---   (05-29-2020  nuclear stress test in epic , low risk no ischemia nuclear ef 63%)   Inguinal hernia, right    Non-rheumatic aortic regurgitation    mild to moderate AR without stenosis per last echo in epic 02-27-2021   Perennial allergic rhinitis    followed by dr gollager ([facility])   Wears dentures    upper   Wears glasses    Wears hearing aid in both ears     PAST SURGICAL HISTORY: Past Surgical History:  Procedure Laterality Date   ANTERIOR CERVICAL DECOMP/DISCECTOMY FUSION N/A 11/09/2019   Procedure: ANTERIOR CERVICAL DECOMPRESSION FUSION CERVICAL 3-4 WITH INSTRUMENTATION AND ALLOGRAFT;  Surgeon: DPhylliss Bob MD;  [facility] [facility]  Service: Orthopedics;  Laterality: N/A;   CARDIAC CATHETERIZATION  [place]   [place]   per pt stress test with possible ischemia,  told arteries normal   COLONOSCOPY  2020   CYST EXCISION  2005   per pt from back , was benign   INGUINAL HERNIA REPAIR Right 06/04/2021   Procedure: OPEN RIGHT INGUINAL HERNIA REPAIR;  Surgeon: CClovis Riley MD;  [facility]  Service: General;  Laterality: Right;   [place]OSTATE[place]Y  2004   [place]  UPPER GI ENDOSCOPY  2021    FAMILY HISTORY: The patient family history includes Pancreatic cancer in his mother; Prostate cancer in his father.  SOCIAL HISTORY:  The patient   reports that he quit smoking about 50 years ago. His smoking use included cigarettes. He has never used smokeless tobacco. He reports that he does not drink alcohol and does not use drugs.  REVIEW OF SYSTEMS: Review of Systems  Cardiovascular:  Negative for chest pain, dyspnea on exertion, leg swelling, orthopnea, palpitations, paroxysmal nocturnal dyspnea and syncope.  Respiratory:  Negative for shortness of breath.    PHYSICAL EXAM: Vitals with BMI 07/02/2021 06/04/2021 06/04/2021  Height 6' 2" - -  Weight 190 lbs - -  BMI 248.54- -  Systolic [id number] Diastolic 69 86 86  Pulse 90 83 77   CONSTITUTIONAL: Well-developed and well-nourished. No acute distress.  SKIN: Skin is warm and dry. No rash noted. No cyanosis. No pallor. No jaundice HEAD: Normocephalic and atraumatic.  EYES: No scleral  icterus MOUTH/THROAT: Moist oral membranes.  NECK: No JVD present. No thyromegaly noted. Right carotid bruits  LYMPHATIC: No visible cervical adenopathy.  CHEST Normal respiratory effort. No intercostal retractions.  Mild tenderness to palpation left side of the chest at the midclavicular line fifth intercostal space. LUNGS: Clear to auscultation bilaterally. No stridor. No wheezes. No rales.  CARDIOVASCULAR: Regular rate and rhythm, positive S1-S2, 3 out of 6 diastolic murmur heard at the left sternal border. No rubs or gallops appreciated. ABDOMINAL: No apparent ascites.  EXTREMITIES: No bilateral lower leg edema, warm to touch, 2+ posterior tibial pulses bilaterally. HEMATOLOGIC: No significant bruising NEUROLOGIC: Oriented to person, place, and time. Nonfocal. Normal muscle tone.  PSYCHIATRIC: Normal mood and affect. Normal behavior. Cooperative  CARDIAC DATABASE: EKG: 07/02/2021: NSR, 78 bpm, normal axis without underlying ischemia injury pattern.    Echocardiogram: 02/27/2021 1. Left ventricular ejection fraction, by estimation, is 60 to 65%. The left ventricle has normal function. The left  ventricle has no regional wall motion abnormalities. Left ventricular diastolic parameters were normal. 2. Right ventricular systolic function is normal. The right ventricular size is normal. There is normal pulmonary artery systolic pressure. 3. The mitral valve is normal in structure. Trivial mitral valve regurgitation. 4. The aortic valve is tricuspid. Aortic valve regurgitation is mild to moderate. No aortic stenosis is present. 5. Large simple cyst in the liver measuring 7.7x10.2 cm.   Comparison(s): Compared to prior study 06/04/2020: Moderate AR is now mild to moderate otherwise no significant change. Liver cyst is seen again on current study consider ultrasound of the liver/abdomen to further evaluate if clinical indicated.   Stress Testing: Lexiscan Tetrofosmin Stress Test 05/29/2020: Nondiagnostic ECG stress. Myocardial perfusion is normal. Overall LV systolic function is normal without regional wall motion abnormalities. Stress LV EF: 63%.  No previous exam available for comparison. Low risk.   Heart Catheterization: 18 years ago w/ Dr. Orie Rout back in [facility]. No records to review.   Carotid artery duplex 06/12/2021: No hemodynamically significant arterial disease in the internal carotid artery bilaterally. Minimal soft plaque noted in bilateral carotid arteries.  Compared to 06/04/2020, right ICA stenosis of 15-49% not seen. Antegrade right vertebral artery flow. Antegrade left vertebral artery flow.  LABORATORY DATA: CBC Latest Ref Rng & Units 06/04/2021 11/18/2020 11/02/2020  WBC 4.0 - 10.5 K/uL - 3.6(L) 5.1  Hemoglobin 13.0 - 17.0 g/dL 14.6 11.3(L) 11.2(L)  Hematocrit 39.0 - 52.0 % 43.0 35.1(L) 35.0(L)  Platelets 150 - 400 K/uL - 223 330    CMP Latest Ref Rng & Units 06/04/2021 11/18/2020 11/02/2020  Glucose 70 - 99 mg/dL 95 107(H) 99  BUN 8 - 23 mg/dL _0 Creatinine 0.61 - 1.24 mg/dL 1.10 1.10 1.18  Sodium 135 - 145 mmol/L 141 134(L) 137  Potassium 3.5 - 5.1 mmol/L 4.0  3.6 4.6  Chloride 98 - 111 mmol/L 104 100 101  CO2 22 - 32 mmol/L - 26 30  Calcium 8.9 - 10.3 mg/dL - 9.1 9.6  Total Protein 6.5 - 8.1 g/dL - 7.9 8.6(H)  Total Bilirubin 0.3 - 1.2 mg/dL - 0.7 0.5  Alkaline Phos 38 - 126 U/L - 74 79  AST 15 - 41 U/L - 26 19  ALT 0 - 44 U/L - 28 23    Lipid Panel  No results found for: CHOL, TRIG, HDL, CHOLHDL, VLDL, LDLCALC, LDLDIRECT, LABVLDL  No components found for: NTPROBNP No results for input(s): PROBNP in the last 8760 hours. No results for input(s):  TSH in the last 8760 hours.  BMP Recent Labs    09/23/20 0556 11/02/20 1556 11/18/20 0500 06/04/21 0930  NA 135 137 134* 141  K 3.8 4.6 3.6 4.0  CL 99 101 100 104  CO2 _0 --   GLUCOSE 130* 99 107* 95  BUN _1 CREATININE 1.03 1.18 1.10 1.10  CALCIUM 8.8* 9.6 9.1  --   GFRNONAA >60 >60 >60  --     HEMOGLOBIN A1C No results found for: HGBA1C, MPG  External Labs: Collected: 10/25/2019 Creatinine 0.99 mg/dL. eGFR: >60 mL/min per 1.73 m Potassium 4.2 AST 24, ALT 38, alkaline phosphatase 59 Lipid profile: Total cholesterol 105, triglycerides 54, HDL 49, LDL 43, non-HDL 56 Hemoglobin A1c: 5.6 TSH: 0.73,Free T4 1.3, and Free T3 2.8 BNP: 53   IMPRESSION:    ICD-10-CM   1. Asymptomatic stenosis of right carotid artery  I65.21 EKG 12-Lead    2. Nonrheumatic aortic valve insufficiency  I35.1     3. Benign hypertension  I10     4. Hypercholesteremia  E78.00     5. Former smoker  Z87.891     67. History of COVID-19  Z86.16        RECOMMENDATIONS: AADIN GAUT is a 76 y.o. male whose past medical history and cardiac risk factors include: Prostate Cancer (s/p surgery, radiation,  and hormone tx), Hypertension, Hyperlipidemia, Hx of asymptomatic right ICA stenosis, aortic regurgitation, Hx of COVID infection, advanced age, former smoker.  Asymptomatic stenosis of right carotid artery Resolved.  Based on recent carotid duplex no significant hemodynamic  stenosis in bilateral ICAs. We will still recommend aspirin and statin therapy given his other risk factors and history of right ICA stenosis. No additional cardiac duplex scheduled given his age and improvement in overall disease progression. We will monitor clinically.  Nonrheumatic aortic valve insufficiency Patient had an echocardiogram in October 2022 to reevaluate the progression of aortic regurgitation. Patient's overall aortic regurgitation has improved from moderate to mild/moderate. Clinically remains asymptomatic. No significant pulse pressure difference.  Monitor for now  Benign hypertension Office blood pressures are very well controlled. Continue current medical therapy. Currently managed by primary care provider.  Hypercholesteremia Continue statin therapy. Last LDL within acceptable range. Currently managed by primary care provider.  As part of today's office visit discussed management of at least 2 chronic comorbid conditions.  As part of medical decision making reviewed the results of the echocardiogram, carotid duplex, and the last lipid profile results noted above for further reference.  Would like to see him back in 1 year or sooner if change in clinical status.  FINAL MEDICATION LIST END OF ENCOUNTER: No orders of the defined types were placed in this encounter.     Current Outpatient Medications:    acetaminophen (TYLENOL) 500 MG tablet, Take 1,000 mg by mouth every 8 (eight) hours as needed for moderate pain., Disp: , Rfl:    aluminum hydroxide-magnesium carbonate (GAVISCON) 95-358 MG/15ML SUSP, Take 30 mLs by mouth as needed for indigestion or heartburn., Disp: , Rfl:    amLODipine (NORVASC) 10 MG tablet, Take 10 mg by mouth daily., Disp: , Rfl:    ascorbic acid (VITAMIN C) 500 MG tablet, Take 500 mg by mouth daily., Disp: , Rfl:    aspirin 81 MG EC tablet, Take 81 mg by mouth at bedtime., Disp: , Rfl:    atorvastatin (LIPITOR) 40 MG tablet, Take 40 mg by  mouth at bedtime., Disp: ,  Rfl:    azelastine (ASTELIN) 0.1 % nasal spray, Place 2 sprays into both nostrils 2 (two) times daily as needed for rhinitis., Disp: 30 mL, Rfl: 5   b complex vitamins tablet, Take 1 tablet by mouth daily as needed., Disp: , Rfl:    cholecalciferol (VITAMIN D3) 25 MCG (1000 UNIT) tablet, Take 1,000 Units by mouth daily., Disp: , Rfl:    Coenzyme Q10-levOCARNitine (CO Q-10 PLUS PO), Take 1 tablet by mouth daily at 12 noon., Disp: , Rfl:    desloratadine (CLARINEX) 5 MG tablet, Take 1 tablet (5 mg total) by mouth in the morning and at bedtime. once a day as needed for runny nose or itch, Disp: 180 tablet, Rfl: 2   ECHINACEA EXTRACT PO, Take 1 capsule by mouth daily as needed (cold symptoms)., Disp: , Rfl:    famotidine (PEPCID) 40 MG tablet, Take 40 mg by mouth daily., Disp: , Rfl:    fluticasone (FLONASE) 50 MCG/ACT nasal spray, Place 1 spray into both nostrils 2 (two) times daily., Disp: 16 g, Rfl: 2   Magnesium 250 MG TABS, Take 250 mg by mouth at bedtime., Disp: , Rfl:    Melatonin 5 MG CAPS, Take 10 mg by mouth at bedtime., Disp: , Rfl:    nebivolol (BYSTOLIC) 10 MG tablet, TAKE 1 TABLET BY MOUTH EVERY DAY IN THE MORNING (Patient taking differently: Take 10 mg by mouth daily.), Disp: 90 tablet, Rfl: 0   Nutritional Supplements (JUICE PLUS FIBRE PO), Take 2 capsules by mouth daily., Disp: , Rfl:    Polyethylene Glycol 3350 (MIRALAX PO), Take by mouth daily., Disp: , Rfl:    Probiotic Product (PROBIOTIC DAILY PO), Take 1 capsule by mouth daily., Disp: , Rfl:    valsartan-hydrochlorothiazide (DIOVAN-HCT) 320-12.5 MG tablet, Take 1 tablet by mouth daily., Disp: , Rfl:    zinc gluconate 50 MG tablet, Take 50 mg by mouth daily as needed (supplement)., Disp: , Rfl:   Orders Placed This Encounter  Procedures   EKG 12-Lead    There are no Patient Instructions on file for this visit.   --Continue cardiac medications as reconciled in final medication list. --Return in  about 1 year (around 07/03/2022) for Follow up. Or sooner if needed. --Continue follow-up with your primary care physician regarding the management of your other chronic comorbid conditions.  Patient's questions and concerns were addressed to his satisfaction. He voices understanding of the instructions provided during this encounter.   This note was created using a voice recognition software as a result there may be grammatical errors inadvertently enclosed that do not reflect the nature of this encounter. Every attempt is made to correct such errors.  Rex Kras, [place], [facility]  Pager: [phone] Office: [phone]

## 2021-07-03 ENCOUNTER — Ambulatory Visit (HOSPITAL_COMMUNITY)
Admission: EM | Admit: 2021-07-03 | Discharge: 2021-07-03 | Disposition: A | Discharge status: Home / Self Care | Payer: Medicare PPO | Attending: Physician Assistant | Admitting: Physician Assistant

## 2021-07-03 ENCOUNTER — Other Ambulatory Visit: Payer: Self-pay

## 2021-07-03 ENCOUNTER — Emergency Department (HOSPITAL_COMMUNITY): Payer: Medicare PPO

## 2021-07-03 ENCOUNTER — Encounter (HOSPITAL_COMMUNITY): Payer: Self-pay

## 2021-07-03 ENCOUNTER — Emergency Department (HOSPITAL_COMMUNITY)
Admission: EM | Admit: 2021-07-03 | Discharge: 2021-07-03 | Disposition: A | Discharge status: Home / Self Care | Payer: Medicare PPO | Attending: Emergency Medicine | Admitting: Emergency Medicine

## 2021-07-03 DIAGNOSIS — G44209 Tension-type headache, unspecified, not intractable: Secondary | ICD-10-CM | POA: Insufficient documentation

## 2021-07-03 DIAGNOSIS — R519 Headache, unspecified: Secondary | ICD-10-CM

## 2021-07-03 DIAGNOSIS — Z859 Personal history of malignant neoplasm, unspecified: Secondary | ICD-10-CM | POA: Diagnosis not present

## 2021-07-03 DIAGNOSIS — Z7982 Long term (current) use of aspirin: Secondary | ICD-10-CM | POA: Diagnosis not present

## 2021-07-03 LAB — POCT URINALYSIS DIPSTICK, ED / UC
Bilirubin Urine: NEGATIVE
Glucose, UA: NEGATIVE mg/dL
Hgb urine dipstick: NEGATIVE
Ketones, ur: NEGATIVE mg/dL
Leukocytes,Ua: NEGATIVE
Nitrite: NEGATIVE
Protein, ur: NEGATIVE mg/dL
Specific Gravity, Urine: 1.015 (ref 1.005–1.030)
Urobilinogen, UA: 0.2 mg/dL (ref 0.0–1.0)
pH: 7 (ref 5.0–8.0)

## 2021-07-03 LAB — URINALYSIS, ROUTINE W REFLEX MICROSCOPIC
Bilirubin Urine: NEGATIVE
Glucose, UA: NEGATIVE mg/dL
Hgb urine dipstick: NEGATIVE
Ketones, ur: NEGATIVE mg/dL
Leukocytes,Ua: NEGATIVE
Nitrite: NEGATIVE
Protein, ur: NEGATIVE mg/dL
Specific Gravity, Urine: 1.009 (ref 1.005–1.030)
pH: 6 (ref 5.0–8.0)

## 2021-07-03 LAB — COMPREHENSIVE METABOLIC PANEL
ALT: 19 U/L (ref 0–44)
AST: 19 U/L (ref 15–41)
Albumin: 3.7 g/dL (ref 3.5–5.0)
Alkaline Phosphatase: 89 U/L (ref 38–126)
Anion gap: 9 (ref 5–15)
BUN: 12 mg/dL (ref 8–23)
CO2: 28 mmol/L (ref 22–32)
Calcium: 9.4 mg/dL (ref 8.9–10.3)
Chloride: 103 mmol/L (ref 98–111)
Creatinine, Ser: 1.09 mg/dL (ref 0.61–1.24)
GFR, Estimated: >60 mL/min (ref >60)
Glucose, Bld: 139 mg/dL — ABNORMAL HIGH (ref 70–99)
Potassium: 3.9 mmol/L (ref 3.5–5.1)
Sodium: 140 mmol/L (ref 135–145)
Total Bilirubin: 0.5 mg/dL (ref 0.3–1.2)
Total Protein: 7.4 g/dL (ref 6.5–8.1)

## 2021-07-03 LAB — CBC WITH DIFFERENTIAL/PLATELET
Abs Immature Granulocytes: 0.01 10*3/uL (ref 0.00–0.07)
Basophils Absolute: 0 10*3/uL (ref 0.0–0.1)
Basophils Relative: 1 %
Eosinophils Absolute: 0.1 10*3/uL (ref 0.0–0.5)
Eosinophils Relative: 3 %
HCT: 38.2 % — ABNORMAL LOW (ref 39.0–52.0)
Hemoglobin: 12 g/dL — ABNORMAL LOW (ref 13.0–17.0)
Immature Granulocytes: 0 %
Lymphocytes Relative: 15 %
Lymphs Abs: 0.7 10*3/uL (ref 0.7–4.0)
MCH: 28 pg (ref 26.0–34.0)
MCHC: 31.4 g/dL (ref 30.0–36.0)
MCV: 89.3 fL (ref 80.0–100.0)
Monocytes Absolute: 0.4 10*3/uL (ref 0.1–1.0)
Monocytes Relative: 10 %
Neutro Abs: 3.2 10*3/uL (ref 1.7–7.7)
Neutrophils Relative %: 71 %
Platelets: 238 10*3/uL (ref 150–400)
RBC: 4.28 MIL/uL (ref 4.22–5.81)
RDW: 15.9 % — ABNORMAL HIGH (ref 11.5–15.5)
WBC: 4.4 10*3/uL (ref 4.0–10.5)
nRBC: 0 % (ref 0.0–0.2)

## 2021-07-03 MED ORDER — DICLOFENAC SODIUM 1 % EX GEL: 4.0000 g | Freq: Four times a day (QID) | CUTANEOUS | 0 refills | Status: DC

## 2021-07-03 NOTE — Discharge Instructions (Signed)
Use the gel as prescribed.  You can take Tylenol up to 4 g a day which is usually 1000 mg 4 times a day.  Please follow-up with your orthopedic doctor and family doctor.  Please return for one-sided weakness or numbness worsening headache or vomiting. ?

## 2021-07-03 NOTE — ED Provider Notes (Signed)
[facility] EMERGENCY DEPARTMENT Provider Note   CSN: [id number] Arrival date & time: 07/03/21  1513     History  Chief Complaint  Patient presents with   Headache    Kenneth Daniel is a 76 y.o. male.  76 yo M with a chief complaint of a headache.  Is been going on for couple days now.  The patient had been seen at urgent care today and they are concerned about possible intracranial pathology and he was sent over for CT imaging.  He felt like his legs were a little bit heavy at the onset.  He felt like it came on somewhat acutely.  He denies trauma denies one-sided numbness or weakness denies difficulty speech or swallowing.  Has a remote history of cancer.   Headache     Home Medications Prior to Admission medications   Medication Sig Start Date End Date Taking? Authorizing Provider  diclofenac Sodium (VOLTAREN) 1 % GEL Apply 4 g topically 4 (four) times daily. 07/03/21  Yes Deno Etienne, DO  acetaminophen (TYLENOL) 500 MG tablet Take 1,000 mg by mouth every 8 (eight) hours as needed for moderate pain.    [provider]  aluminum hydroxide-magnesium carbonate (GAVISCON) 95-358 MG/15ML SUSP Take 30 mLs by mouth as needed for indigestion or heartburn.    [provider]  amLODipine (NORVASC) 10 MG tablet Take 10 mg by mouth daily.    [provider]  ascorbic acid (VITAMIN C) 500 MG tablet Take 500 mg by mouth daily.    [provider]  aspirin 81 MG EC tablet Take 81 mg by mouth at bedtime.    [provider]  atorvastatin (LIPITOR) 40 MG tablet Take 40 mg by mouth at bedtime.    [provider]  azelastine (ASTELIN) 0.1 % nasal spray Place 2 sprays into both nostrils 2 (two) times daily as needed for rhinitis. 06/03/21   Valentina Shaggy, MD  b complex vitamins tablet Take 1 tablet by mouth daily as needed.    [provider]  cholecalciferol (VITAMIN D3) 25 MCG (1000 UNIT) tablet Take 1,000 Units  by mouth daily.    [provider]  Coenzyme Q10-levOCARNitine (CO Q-10 PLUS PO) Take 1 tablet by mouth daily at 12 noon.    [provider]  desloratadine (CLARINEX) 5 MG tablet Take 1 tablet (5 mg total) by mouth in the morning and at bedtime. once a day as needed for runny nose or itch 06/03/21 09/01/21  Valentina Shaggy, MD  [facility] EXTRACT PO Take 1 capsule by mouth daily as needed (cold symptoms).    [provider]  famotidine (PEPCID) 40 MG tablet Take 40 mg by mouth daily.    [provider]  fluticasone (FLONASE) 50 MCG/ACT nasal spray Place 1 spray into both nostrils 2 (two) times daily. 12/12/20   Dara Hoyer, FNP  Magnesium 250 MG TABS Take 250 mg by mouth at bedtime.    [provider]  Melatonin 5 MG CAPS Take 10 mg by mouth at bedtime.    [provider]  nebivolol (BYSTOLIC) 10 MG tablet TAKE 1 TABLET BY MOUTH EVERY DAY IN THE MORNING Patient taking differently: Take 10 mg by mouth daily. 05/06/21   Tolia, Sunit, DO  Nutritional Supplements (JUICE PLUS FIBRE PO) Take 2 capsules by mouth daily.    [provider]  Polyethylene Glycol 3350 (MIRALAX PO) Take by mouth daily.    [provider]  Probiotic Product (PROBIOTIC DAILY PO) Take 1 capsule by mouth daily.    [provider]  valsartan-hydrochlorothiazide (DIOVAN-HCT) 320-12.5 MG tablet Take 1 tablet by mouth daily. 02/21/20   [provider]  zinc gluconate 50 MG tablet Take 50 mg by mouth daily as needed (supplement).    [provider]      Allergies    Patient has no known allergies.    Review of Systems   Review of Systems  Neurological:  Positive for headaches.   Physical Exam Updated Vital Signs BP (!) 142/84    Pulse 80    Temp 97.9 F (36.6 C)    Resp 10    SpO2 99%  Physical Exam Vitals and nursing note reviewed.  Constitutional:      Appearance: He is well-developed.  HENT:     Head: Normocephalic and  atraumatic.  Eyes:     Pupils: Pupils are equal, round, and reactive to light.  Neck:     Vascular: No JVD.  Cardiovascular:     Rate and Rhythm: Normal rate and regular rhythm.     Heart sounds: No murmur heard.   No friction rub. No gallop.  Pulmonary:     Effort: No respiratory distress.     Breath sounds: No wheezing.  Abdominal:     General: There is no distension.     Tenderness: There is no abdominal tenderness. There is no guarding or rebound.  Musculoskeletal:        General: Normal range of motion.     Cervical back: Normal range of motion and neck supple.  Skin:    Coloration: Skin is not pale.     Findings: No rash.  Neurological:     Mental Status: He is alert and oriented to person, place, and time.     GCS: GCS eye subscore is 4. GCS verbal subscore is 5. GCS motor subscore is 6.     Cranial Nerves: Cranial nerves 2-12 are intact.     Sensory: Sensation is intact.     Motor: Motor function is intact.     Coordination: Coordination is intact.     Comments: Benign neurologic exam  Psychiatric:        Behavior: Behavior normal.    ED Results / Procedures / Treatments   Labs (all labs ordered are listed, but only abnormal results are displayed) Labs Reviewed  URINALYSIS, ROUTINE W REFLEX MICROSCOPIC - Abnormal; Notable for the following components:      Result Value   Color, Urine STRAW (*)    All other components within normal limits  COMPREHENSIVE METABOLIC PANEL - Abnormal; Notable for the following components:   Glucose, Bld 139 (*)    All other components within normal limits  CBC WITH DIFFERENTIAL/PLATELET - Abnormal; Notable for the following components:   Hemoglobin 12.0 (*)    HCT 38.2 (*)    RDW 15.9 (*)    All other components within normal limits    EKG None  Radiology CT Head Wo Contrast  Result Date: 07/03/2021 CLINICAL DATA:  Headache. EXAM: CT HEAD WITHOUT CONTRAST TECHNIQUE: Contiguous axial images were obtained from the base of the  skull through the vertex without intravenous contrast. RADIATION DOSE REDUCTION: This exam was performed according to the departmental dose-optimization program which includes automated exposure control, adjustment of the mA and/or kV according to patient size and/or use of iterative reconstruction technique. COMPARISON:  None. FINDINGS: Brain: No evidence of acute infarction, hemorrhage, hydrocephalus, extra-axial  collection or mass lesion/mass effect. Vascular: No hyperdense vessel or unexpected calcification. Skull: Normal. Negative for fracture or focal lesion. Sinuses/Orbits: No acute finding. Other: None IMPRESSION: 1. No acute intracranial abnormalities. 2. Chronic small vessel ischemic disease. Electronically Signed   By: Kerby Moors M.D.   On: 07/03/2021 16:22    Procedures Procedures    Medications Ordered in ED Medications - No data to display  ED Course/ Medical Decision Making/ A&P                           Medical Decision Making  76 yo M with a chief complaint of a headache.  This was frontal in nature but now he feels it more in the posterior aspect of the head and down into the neck bilaterally.  He was seen in urgent care and sent here with concern for possible intracranial pathology with a history of cancer in the past.  CT scan of the head without intracranial pathology no significant anemia no significant electrolyte abnormality UA was obtained that is independently interpreted by me negative for infection.  Based on history most likely the patient suffered a muscular neck strain based on a strange sleeping position when he had sat in a chair prior to the onset of his headache.  He has some mild discomfort to the posterior aspect of the neck currently.  He has a benign neurologic exam.  I doubt that he has a vertebral artery dissection.  We will treat with topical NSAIDs.  Have him follow-up with his PCP.  9:50 PM:  I have discussed the diagnosis/risks/treatment options with  the patient.  Evaluation and diagnostic testing in the emergency department does not suggest an emergent condition requiring admission or immediate intervention beyond what has been performed at this time.  They will follow up with  PCP. We also discussed returning to the ED immediately if new or worsening sx occur. We discussed the sx which are most concerning (e.g., sudden worsening pain, fever, inability to tolerate by mouth) that necessitate immediate return. Medications administered to the patient during their visit and any new prescriptions provided to the patient are listed below.  Medications given during this visit Medications - No data to display   The patient appears reasonably screen and/or stabilized for discharge and I doubt any other medical condition or other [facility] requiring further screening, evaluation, or treatment in the ED at this time prior to discharge.          Final Clinical Impression(s) / ED Diagnoses Final diagnoses:  Tension headache    Rx / DC Orders ED Discharge Orders          Ordered    diclofenac Sodium (VOLTAREN) 1 % GEL  4 times daily        07/03/21 2138              Deno Etienne, DO 07/03/21 2150

## 2021-07-03 NOTE — ED Triage Notes (Signed)
Pt reports headache and sinus pressure that started yesterday. ?

## 2021-07-03 NOTE — ED Provider Notes (Signed)
[facility]    CSN: [id number] Arrival date & time: 07/03/21  1107      History   Chief Complaint Chief Complaint  Patient presents with   Headache    HPI Kenneth Daniel is a 76 y.o. male.   Patient presents today with a 1 day history of headache.  He reports that headache began when he went from sitting to standing position when he felt tension in his shoulders into his neck that involved his head.  Since that time he has had intermittent headache that is worse with positional changes including going from a sitting to standing position.  He reports pain is rated 6 on a 0-10 pain scale, localized to bitemporal region with radiation around head, described as pressure, no alleviating factors identified.  He denies any history of headaches in himself or his family.  He denies any head injury.  Denies any recent illness or additional symptoms including fever, cough, congestion, nausea, vomiting.  He does have a history of malignancy.  Denies any medication changes.  He reports that headache pain is not very severe but he does not have a history of headaches making him concerned for current symptoms.  He has tried Tylenol without improvement of symptoms.  He has a history of hypertension but reports that this is generally well controlled with current medication regimen.  He denies any recent procedures.  Denies any unilateral clear rhinorrhea.   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, right    per last duplex in epic 06-04-2020  right ICA 16-49% and right ECA >50%   Chronic constipation    GERD (gastroesophageal reflux disease)    History of acute pyelonephritis 09/2020   w/ admission in epic due to severe sepsis   History of COVID-19 11/18/2020   positive result in epic;   per pt mild to moderate symptoms that resolved   History of external beam radiation therapy    completed in 2015 for recurrent prostate cancer  (done in [facility])   History of prostate cancer    (current urologist-- dr Milford Cage, and current treatment lupron injeciton q 52months) ;   per pt in [place], [place] 2004  s/p radical prostatectomy;   recurrence 2015 completed radiation   HLD (hyperlipidemia)    Hypertension    followed by pcp and cardiology---   (05-29-2020  nuclear stress test in epic , low risk no ischemia nuclear ef 63%)   Inguinal hernia, right    Non-rheumatic aortic regurgitation    mild to moderate AR without stenosis per last echo in epic 02-27-2021   Perennial allergic rhinitis    followed by dr gollager ([facility])   Wears dentures    upper   Wears glasses    Wears hearing aid in both ears     Patient Active Problem List   Diagnosis Date Noted   Nonrheumatic aortic valve insufficiency    Perennial allergic rhinitis 12/12/2020   Pruritus 12/12/2020   Seasonal allergic conjunctivitis 12/12/2020   Acute pyelonephritis 09/22/2020   Pyelonephritis 09/22/2020   Myelopathy ([place]) 11/09/2019    Past Surgical History:  Procedure Laterality Date   ANTERIOR CERVICAL DECOMP/DISCECTOMY FUSION N/A 11/09/2019   Procedure: ANTERIOR CERVICAL DECOMPRESSION FUSION CERVICAL 3-4 WITH INSTRUMENTATION AND ALLOGRAFT;  Surgeon: Phylliss Bob, MD;  Location: [facility];  Service: Orthopedics;  Laterality: N/A;   CARDIAC CATHETERIZATION  2002   in [place], [place];  per pt stress test with possible ischemia,  told arteries normal   COLONOSCOPY  2020   CYST EXCISION  2005   per pt from back , was benign   INGUINAL HERNIA REPAIR Right 06/04/2021   Procedure: OPEN RIGHT INGUINAL HERNIA REPAIR;  Surgeon: Clovis Riley, MD;  Location: [facility];  Service: General;  Laterality: Right;   PROSTATECTOMY  2004   in [place], [place] [facility] ENDOSCOPY  2021       Home Medications    Prior to Admission medications   Medication Sig Start Date End Date Taking? Authorizing Provider  acetaminophen (TYLENOL) 500 MG tablet  Take 1,000 mg by mouth every 8 (eight) hours as needed for moderate pain.    [provider]  aluminum hydroxide-magnesium carbonate (GAVISCON) 95-358 MG/15ML SUSP Take 30 mLs by mouth as needed for indigestion or heartburn.    [provider]  amLODipine (NORVASC) 10 MG tablet Take 10 mg by mouth daily.    [provider]  ascorbic acid (VITAMIN C) 500 MG tablet Take 500 mg by mouth daily.    [provider]  aspirin 81 MG EC tablet Take 81 mg by mouth at bedtime.    [provider]  atorvastatin (LIPITOR) 40 MG tablet Take 40 mg by mouth at bedtime.    [provider]  azelastine (ASTELIN) 0.1 % nasal spray Place 2 sprays into both nostrils 2 (two) times daily as needed for rhinitis. 06/03/21   Valentina Shaggy, MD  b complex vitamins tablet Take 1 tablet by mouth daily as needed.    [provider]  cholecalciferol (VITAMIN D3) 25 MCG (1000 UNIT) tablet Take 1,000 Units by mouth daily.    [provider]  Coenzyme Q10-levOCARNitine (CO Q-10 PLUS PO) Take 1 tablet by mouth daily at 12 noon.    [provider]  desloratadine (CLARINEX) 5 MG tablet Take 1 tablet (5 mg total) by mouth in the morning and at bedtime. once a day as needed for runny nose or itch 06/03/21 09/01/21  Valentina Shaggy, MD  [facility] EXTRACT PO Take 1 capsule by mouth daily as needed (cold symptoms).    [provider]  famotidine (PEPCID) 40 MG tablet Take 40 mg by mouth daily.    [provider]  fluticasone (FLONASE) 50 MCG/ACT nasal spray Place 1 spray into both nostrils 2 (two) times daily. 12/12/20   Dara Hoyer, FNP  Magnesium 250 MG TABS Take 250 mg by mouth at bedtime.    [provider]  Melatonin 5 MG CAPS Take 10 mg by mouth at bedtime.    [provider]  nebivolol (BYSTOLIC) 10 MG tablet TAKE 1 TABLET BY MOUTH EVERY DAY IN THE MORNING Patient taking differently: Take 10 mg by mouth daily.  05/06/21   Tolia, Sunit, DO  Nutritional Supplements (JUICE PLUS FIBRE PO) Take 2 capsules by mouth daily.    [provider]  Polyethylene Glycol 3350 (MIRALAX PO) Take by mouth daily.    [provider]  Probiotic Product (PROBIOTIC DAILY PO) Take 1 capsule by mouth daily.    [provider]  valsartan-hydrochlorothiazide (DIOVAN-HCT) 320-12.5 MG tablet Take 1 tablet by mouth daily. 02/21/20   [provider]  zinc gluconate 50 MG tablet Take 50 mg by mouth daily as needed (supplement).    [provider]    Family History Family History  Problem Relation Age of Onset   Pancreatic  cancer Mother    Prostate cancer Father     Social History Social History   Tobacco Use   Smoking status: Former    Years: 4.00    Types: Cigarettes    Quit date: 1973    Years since quitting: 50.1   Smokeless tobacco: Never  Vaping Use   Vaping Use: Never used  Substance Use Topics   Alcohol use: Never   Drug use: Never     Allergies   Patient has no known allergies.   Review of Systems Review of Systems  Constitutional:  Positive for activity change. Negative for appetite change, fatigue, fever and unexpected weight change.  HENT:  Negative for congestion, sinus pressure, sneezing and sore throat.   Eyes:  Negative for photophobia and visual disturbance.  Respiratory:  Negative for cough and shortness of breath.   Cardiovascular:  Negative for chest pain.  Gastrointestinal:  Negative for abdominal pain, diarrhea, nausea and vomiting.  Musculoskeletal:  Positive for neck pain. Negative for arthralgias and myalgias.  Neurological:  Positive for headaches. Negative for dizziness, seizures, syncope, facial asymmetry, speech difficulty, weakness and light-headedness.    Physical Exam Triage Vital Signs ED Triage Vitals [07/03/21 1306]  Enc Vitals Group     BP (!) 164/101     Pulse Rate 90     Resp 16     Temp 98.4 F (36.9 C)     Temp Source  Oral     SpO2 99 %     Weight      Height      Head Circumference      Peak Flow      Pain Score 6     Pain Loc      Pain Edu?      Excl. in [place]?    No data found.  Updated Vital Signs BP (!) 156/83 (BP Location: Left Arm)    Pulse 90    Temp 98.4 F (36.9 C) (Oral)    Resp 16    SpO2 100%   Visual Acuity Right Eye Distance:   Left Eye Distance:   Bilateral Distance:    Right Eye Near:   Left Eye Near:    Bilateral Near:     Physical Exam Vitals reviewed.  Constitutional:      General: He is awake.     Appearance: Normal appearance. He is well-developed. He is not ill-appearing.     Comments: Very pleasant male appears stated age in no acute distress sitting comfortably in exam room  HENT:     Head: Normocephalic and atraumatic.     Right Ear: Tympanic membrane, ear canal and external ear normal. Tympanic membrane is not erythematous or bulging.     Left Ear: Tympanic membrane, ear canal and external ear normal. Tympanic membrane is not erythematous or bulging.     Nose: Nose normal.     Mouth/Throat:     Tongue: Tongue does not deviate from midline.     Pharynx: Uvula midline. No oropharyngeal exudate, posterior oropharyngeal erythema or uvula swelling.  Eyes:     Extraocular Movements: Extraocular movements intact.     Conjunctiva/sclera: Conjunctivae normal.     Pupils: Pupils are equal, round, and reactive to light.     Comments: Arcus senilis bilaterally  Cardiovascular:     Rate and Rhythm: Normal rate and regular rhythm.     Heart sounds: Normal heart sounds, S1 normal and S2 normal. No murmur heard. Pulmonary:  Effort: Pulmonary effort is normal. No accessory muscle usage or respiratory distress.     Breath sounds: Normal breath sounds. No stridor. No wheezing, rhonchi or rales.     Comments: Clear to auscultation bilaterally Musculoskeletal:     Cervical back: Normal range of motion and neck supple.     Comments: Strength 5/5 bilateral upper and lower  extremities  Neurological:     General: No focal deficit present.     Mental Status: He is alert.     Cranial Nerves: Cranial nerves 2-12 are intact.     Motor: Motor function is intact.     Coordination: Coordination is intact. Romberg sign negative.     Gait: Gait is intact.  Psychiatric:        Behavior: Behavior is cooperative.     UC Treatments / Results  Labs (all labs ordered are listed, but only abnormal results are displayed) Labs Reviewed  POCT URINALYSIS DIPSTICK, ED / UC    EKG   Radiology No results found.  Procedures Procedures (including critical care time)  Medications Ordered in UC Medications - No data to display  Initial Impression / Assessment and Plan / UC Course  I have reviewed the triage vital signs and the nursing notes.  Pertinent labs & imaging results that were available during my care of the patient were reviewed by me and considered in my medical decision making (see chart for details).     Physical exam is reassuring today with no focal neurological defect, however, patient has several potential red flags including active cancer, age 36 with acute onset headache, positional nature of headache.  Discussed with patient potential utility of going to the emergency room for further evaluation and management including potential scan particularly as this is the worst headache of his life (though that is only because he has never had a headache before).  He was agreeable to this and will go directly to the ER.  His vital signs were stable at time of discharge and he was safe for private transport.  Final Clinical Impressions(s) / UC Diagnoses   Final diagnoses:  New onset of headache in cancer patient     Discharge Instructions      Please go to the emergency room for further evaluation and management.     ED Prescriptions   None    PDMP not reviewed this encounter.   Terrilee Croak, PA-C 07/03/21 1411

## 2021-07-03 NOTE — ED Notes (Signed)
All discharge instructions including follow up care and prescriptions reviewed with patient and patient verbalized understanding of same. Patient stable and ambulatory at time of discharge.  

## 2021-07-03 NOTE — ED Triage Notes (Signed)
Pt with intermittent mild headache since last night when he got up at [facility] to go get his food. Coming and going throughout the night.  ?

## 2021-07-03 NOTE — ED Provider Triage Note (Signed)
Emergency Medicine Provider Triage Evaluation Note ? ?Kenneth Daniel , a 76 y.o. male  was evaluated in triage.  Pt complains of worst headache of life with neck tightness since 5PM last night.  Pain wraps around head and is constant.  No recent trauma/LOC.  No lightheadedness/dizziness.  No vision changes.  No numbness/tingling.  Seen at [facility] and recommended going to ED for further workup.  Hx active cancer per notes. ? ?Review of Systems  ?Positive: Headache, neck tightness ?Negative: Neurodeficits, trauma ? ?Physical Exam  ?BP (!) 165/98 (BP Location: Right Arm)   Pulse 85   Temp 98.8 ?F (37.1 ?C) (Oral)   Resp 16   SpO2 98%  ?Gen:   Awake, no distress   ?Resp:  Normal effort, CTAB ?MSK:   Moves extremities without difficulty  ?Other:  No neurodeficits or focal deficits observed, no cervical bony tenderness, normal EOMs,  ? ?Medical Decision Making  ?Medically screening exam initiated at 3:45 PM.  Appropriate orders placed.  Kenneth Daniel was informed that the remainder of the evaluation will be completed by another provider, this initial triage assessment does not replace that evaluation, and the importance of remaining in the ED until their evaluation is complete. ? ?Labs and imaging ordered ?  ?Prince Rome, PA-C ?97/41/63 1558 ? ?

## 2021-07-03 NOTE — Discharge Instructions (Signed)
Please go to the emergency room for further evaluation and management. 

## 2021-08-12 ENCOUNTER — Other Ambulatory Visit: Payer: Self-pay | Admitting: Cardiology

## 2021-08-12 DIAGNOSIS — I10 Essential (primary) hypertension: Secondary | ICD-10-CM

## 2021-08-12 DIAGNOSIS — R5383 Other fatigue: Secondary | ICD-10-CM

## 2021-09-02 ENCOUNTER — Ambulatory Visit (INDEPENDENT_AMBULATORY_CARE_PROVIDER_SITE_OTHER): Payer: Medicare PPO | Admitting: Allergy & Immunology

## 2021-09-02 ENCOUNTER — Encounter: Payer: Self-pay | Admitting: Allergy & Immunology

## 2021-09-02 VITALS — BP 106/60 | HR 81 | Temp 97.4°F | Resp 12 | Ht 74.0 in | Wt 192.8 lb

## 2021-09-02 DIAGNOSIS — L299 Pruritus, unspecified: Secondary | ICD-10-CM

## 2021-09-02 DIAGNOSIS — J3089 Other allergic rhinitis: Secondary | ICD-10-CM | POA: Diagnosis not present

## 2021-09-02 MED ORDER — TRIAMCINOLONE ACETONIDE 0.1 % EX CREA: 1.0000 "application " | TOPICAL_CREAM | Freq: Two times a day (BID) | CUTANEOUS | 1 refills | Status: DC

## 2021-09-02 NOTE — Patient Instructions (Addendum)
Allergic rhinitis (grasses, indoor molds, dust mites) ?- Continue fluticasone 1 spray in each nostril twice a day as needed for stuffy nose.   ?- Continue azelastine 2 sprays in each nostril twice a day as needed for runny nose or itch. ?- Continue Clarinex 5 mg 1-2 times daily. ?- Consider saline nasal rinses as needed for nasal symptoms.  ?- Consider starting allergy shots for long term control. ?- DEFINITELY take a Clarinex TWICE DAILY on nail salon days.  ? ?2. Pruritus ?- Continue a twice a day moisturizing routine ?- Continue Clarinex once a day as needed for itch (as listed above). ?- I sent in the triamcinolone cream to use as needed (avoid the face). ? ?3. Return in about 6 months (around 03/05/2022).  ? ? ?Please inform us of any Emergency Department visits, hospitalizations, or changes in symptoms. Call us before going to the ED for breathing or allergy symptoms since we might be able to fit you in for a sick visit. Feel free to contact us anytime with any questions, problems, or concerns. ? ?It was a pleasure to see you again today! ? ?Websites that have reliable patient information: ?1. American Academy of Asthma, Allergy, and Immunology: www.aaaai.org ?2. Food Allergy Research and Education (FARE): foodallergy.org ?3. Mothers of Asthmatics: http://www.asthmacommunitynetwork.org ?4. [employer] of Allergy, Asthma, and Immunology: MonthlyElectricBill.co.uk ? ? ?COVID-19 Vaccine Information can be found at: ShippingScam.co.uk For questions related to vaccine distribution or appointments, please email [email or web address] or call [phone].  ? ?We realize that you might be concerned about having an allergic reaction to the COVID19 vaccines. To help with that concern, WE ARE OFFERING THE COVID19 VACCINES IN OUR OFFICE! Ask the front desk for dates!  ? ? ? ??Like? [place] on Facebook and Instagram for our latest updates!  ?  ? ? ?A healthy democracy works  best when [employer] participate! Make sure you are registered to vote! If you have moved or changed any of your contact information, you will need to get this updated before voting! ? ?In some cases, you MAY be able to register to vote online: [email or web address] ? ? ? ? ? ? ? ? ? ?

## 2021-09-02 NOTE — Progress Notes (Signed)
? ?FOLLOW UP ? ?Date of Service/Encounter:  09/02/21 ? ? ?Assessment:  ? ?Chronic rhinitis (grass, indoor molds, dust mites) - s/p 30 yrs allergen immunotherapy when he lived in [place], but now with worsening allergic rhinitis symptoms as of late ?  ?Itching - improved ? ?Plan/Recommendations:  ? ?Allergic rhinitis (grasses, indoor molds, dust mites) ?- Continue fluticasone 1 spray in each nostril twice a day as needed for stuffy nose.   ?- Continue azelastine 2 sprays in each nostril twice a day as needed for runny nose or itch. ?- Continue Clarinex 5 mg 1-2 times daily. ?- Consider saline nasal rinses as needed for nasal symptoms.  ?- Consider starting allergy shots for long term control. ?- DEFINITELY take a Clarinex TWICE DAILY on nail salon days.  ? ?2. Pruritus ?- Continue a twice a day moisturizing routine ?- Continue Clarinex once a day as needed for itch (as listed above). ?- I sent in the triamcinolone cream to use as needed (avoid the face). ? ?3. Return in about 6 months (around 03/05/2022).  ? ?Subjective:  ? ?Kenneth Daniel is a 76 y.o. male presenting today for follow up of  ?Chief Complaint  ?Patient presents with  ? Follow-up  ?  No issues at the present time. Patient states he doesn't not need any refills at this time.  ? Other  ?  Every time he goes to the nail salon to get his pedicure he gets congested and it last for a couple of days. Thinks the chemicals in the shop may be the cause and wants some advise about how to reduce his sensitivity to the smells.  ? ? ?Jodi Marble has a history of the following: ?Patient Active Problem List  ? Diagnosis Date Noted  ? Nonrheumatic aortic valve insufficiency   ? Perennial allergic rhinitis 12/12/2020  ? Pruritus 12/12/2020  ? Seasonal allergic conjunctivitis 12/12/2020  ? Acute pyelonephritis 09/22/2020  ? Pyelonephritis 09/22/2020  ? Myelopathy ([place]) 11/09/2019  ? ? ?History obtained from: chart review and patient. ? ?Kenneth Daniel is a 76 y.o. male  presenting for a follow up visit.  He was last seen in January 2023.  At that time, we started him on prednisone for allergic rhinitis symptoms.  We continue with Flonase as well as Astelin and Clarinex.  We talked about allergy shots as well.  For his pruritus, we continued with the Clarinex daily as well as moisturizing twice daily. ? ?Since the last visit, he has done well. He did have a hernia surgery and this went well.  ? ?Allergic Rhinitis Symptom History: He is doing well on the desloratadine. He has increased that to twice daily on particularly bad days. He has not had sinus infections or anything at all since the last visit. He did notice that some issues with nail salons. He had pedicures and the chemicals are strong.  ? ?Itching is better controlled with the current regimen.  It has been less of an issue than it has been in the past. ? ?He is going to see his eye doctor due to some eye itching. He has been keeping his face washed and that helps a lot. Besides that, he is doing well. He is awaiting a hearing aid evaluation of a new hearing aid. His appointment is now scheduled for May 21st.  ? ?Otherwise, there have been no changes to his past medical history, surgical history, family history, or social history. ? ? ? ?Review of Systems  ?Constitutional: Negative.  Negative for chills, fever, malaise/fatigue and weight loss.  ?HENT: Negative.  Negative for congestion, ear discharge, ear pain, nosebleeds and sinus pain.   ?Eyes:  Negative for pain, discharge and redness.  ?Respiratory:  Negative for cough, sputum production, shortness of breath and wheezing.   ?Cardiovascular: Negative.  Negative for chest pain and palpitations.  ?Gastrointestinal:  Negative for abdominal pain, constipation, diarrhea, heartburn, nausea and vomiting.  ?Skin: Negative.  Negative for itching and rash.  ?Neurological:  Negative for dizziness and headaches.  ?Endo/Heme/Allergies:  Positive for environmental allergies. Does not  bruise/bleed easily.   ? ? ? ?Objective:  ? ?Blood pressure 106/60, pulse 81, temperature (!) 97.4 ?F (36.3 ?C), temperature source Temporal, resp. rate 12, height '6\' 2"'$  (1.88 m), weight 192 lb 12.8 oz (87.5 kg), SpO2 98 %. ?Body mass index is 24.75 kg/m?. ? ? ? ?Physical Exam ?Vitals reviewed.  ?Constitutional:   ?   Appearance: He is well-developed.  ?HENT:  ?   Head: Normocephalic and atraumatic.  ?   Right Ear: External ear normal.  ?   Left Ear: External ear normal.  ?   Ears:  ?   Comments: Hearing aids in place bilaterally. ?   Nose: Mucosal edema and rhinorrhea present. No nasal deformity or septal deviation.  ?   Right Turbinates: Enlarged and swollen.  ?   Left Turbinates: Enlarged and swollen.  ?   Right Sinus: No maxillary sinus tenderness or frontal sinus tenderness.  ?   Left Sinus: No maxillary sinus tenderness or frontal sinus tenderness.  ?   Comments: Turbinates are erythematous. ?   Mouth/Throat:  ?   Mouth: Mucous membranes are not pale and not dry.  ?   Pharynx: Uvula midline.  ?Eyes:  ?   General: Lids are normal. No allergic shiner.    ?   Right eye: No discharge.     ?   Left eye: No discharge.  ?   Conjunctiva/sclera: Conjunctivae normal.  ?   Right eye: Right conjunctiva is not injected. No chemosis. ?   Left eye: Left conjunctiva is not injected. No chemosis. ?   Pupils: Pupils are equal, round, and reactive to light.  ?Cardiovascular:  ?   Rate and Rhythm: Normal rate and regular rhythm.  ?   Heart sounds: Normal heart sounds.  ?Pulmonary:  ?   Effort: Pulmonary effort is normal. No tachypnea, accessory muscle usage or respiratory distress.  ?   Breath sounds: Normal breath sounds. No wheezing, rhonchi or rales.  ?Chest:  ?   Chest wall: No tenderness.  ?Lymphadenopathy:  ?   Cervical: No cervical adenopathy.  ?Skin: ?   General: Skin is warm.  ?   Capillary Refill: Capillary refill takes less than 2 seconds.  ?   Coloration: Skin is not pale.  ?   Findings: No abrasion, erythema,  petechiae or rash. Rash is not papular, urticarial or vesicular.  ?   Comments: No eczematous or urticarial lesions noted. Some excoriations noted.  ?Neurological:  ?   Mental Status: He is alert.  ?Psychiatric:     ?   Behavior: Behavior is cooperative.  ?  ? ?Diagnostic studies: none ? ? ? ? ?  ?Salvatore Marvel, MD  ?[facility] [place] [facility] [place] ? ? ? ? ? ? ?

## 2021-09-03 ENCOUNTER — Encounter: Payer: Self-pay | Admitting: Allergy & Immunology

## 2021-09-03 MED ORDER — FLUTICASONE PROPIONATE 50 MCG/ACT NA SUSP: 1.0000 | Freq: Two times a day (BID) | NASAL | 2 refills | Status: DC

## 2021-09-03 NOTE — Addendum Note (Signed)
Addended by: Jacqualin Combes on: 09/03/2021 01:59 PM ? ? Modules accepted: Orders ? ?

## 2021-09-20 ENCOUNTER — Other Ambulatory Visit: Payer: Self-pay

## 2021-09-20 ENCOUNTER — Encounter (HOSPITAL_COMMUNITY): Payer: Self-pay | Admitting: *Deleted

## 2021-09-20 ENCOUNTER — Ambulatory Visit (HOSPITAL_COMMUNITY)
Admission: EM | Admit: 2021-09-20 | Discharge: 2021-09-20 | Disposition: A | Discharge status: Home / Self Care | Payer: Medicare PPO | Attending: Emergency Medicine | Admitting: Emergency Medicine

## 2021-09-20 DIAGNOSIS — M549 Dorsalgia, unspecified: Secondary | ICD-10-CM | POA: Diagnosis not present

## 2021-09-20 LAB — POCT URINALYSIS DIPSTICK, ED / UC
Bilirubin Urine: NEGATIVE
Glucose, UA: NEGATIVE mg/dL
Hgb urine dipstick: NEGATIVE
Ketones, ur: NEGATIVE mg/dL
Leukocytes,Ua: NEGATIVE
Nitrite: NEGATIVE
Protein, ur: NEGATIVE mg/dL
Specific Gravity, Urine: 1.01 (ref 1.005–1.030)
Urobilinogen, UA: 0.2 mg/dL (ref 0.0–1.0)
pH: 6 (ref 5.0–8.0)

## 2021-09-20 NOTE — Discharge Instructions (Signed)
Continue tylenol as needed.  You can use ice or hot pad on your low back.  Continue gentle stretching.  Please return to the urgent care or emergency department if symptoms worsen or do not improve.

## 2021-09-20 NOTE — ED Provider Notes (Signed)
[facility]    CSN: [id number] Arrival date & time: 09/20/21  1215      History   Chief Complaint Chief Complaint  Patient presents with   Flank Pain    HPI Kenneth Daniel is a 76 y.o. male.  Presents with 1 day history of left-sided low back pain.  States he woke up yesterday morning and had soreness.  Seem to be worse with movement.  Went to the gym this morning, had no pain.  Soreness is improved with gentle stretching.  States the soreness has been on and off.  A year ago he had a kidney stone and his providers recommended he go to an urgent care anytime he develops any low back pain.  Denies any radiation into the abdomen or groin.  No trauma to the back.  States he may have slept on it funny. Denies fever, chills, abdominal pain, vomiting/diarrhea, dysuria, frequency/urgency.  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, right    per last duplex in epic 06-04-2020  right ICA 16-49% and right ECA >50%   Chronic constipation    GERD (gastroesophageal reflux disease)    History of acute pyelonephritis 09/2020   w/ admission in epic due to severe sepsis   History of COVID-19 11/18/2020   positive result in epic;   per pt mild to moderate symptoms that resolved   History of external beam radiation therapy    completed in 2015 for recurrent prostate cancer (done in [facility])   History of prostate cancer    (current urologist-- dr Milford Cage, and current treatment lupron injeciton q 59month) ;   [place]  [place]/p [place];   recurrence 2015 completed radiation   HLD (hyperlipidemia)    Hypertension    followed by pcp and cardiology---   (05-29-2020  nuclear stress test in epic , low risk no ischemia nuclear ef 63%)   Inguinal hernia, right    Non-rheumatic aortic regurgitation    mild to moderate AR without stenosis per last echo in epic 02-27-2021   Perennial allergic rhinitis     [facility]   Wears dentures    upper   Wears glasses    Wears hearing aid in both ears     Patient Active Problem List   Diagnosis Date Noted   Nonrheumatic aortic valve insufficiency    Perennial allergic rhinitis 12/12/2020   Pruritus 12/12/2020   Seasonal allergic conjunctivitis 12/12/2020   Acute pyelonephritis 09/22/2020   Pyelonephritis 09/22/2020   Myelopathy ([place] 11/09/2019    Past Surgical History:  Procedure Laterality Date   ANTERIOR CERVICAL DECOMP/DISCECTOMY FUSION N/A 11/09/2019   Procedure: ANTERIOR CERVICAL DECOMPRESSION FUSION CERVICAL 3-4 WITH INSTRUMENTATION AND ALLOGRAFT;  Surgeon: DPhylliss Bob MD;  [place] [facility]  Service: Orthopedics;  Laterality: N/A;   CARDIAC CATHETERIZATION  [place]   [place]   per pt stress test with possible ischemia,  told arteries normal   COLONOSCOPY  2020   CYST EXCISION  2005   per pt from back , was benign   INGUINAL HERNIA REPAIR Right 06/04/2021   Procedure: OPEN RIGHT INGUINAL HERNIA REPAIR;  Surgeon: CClovis Riley MD;  [facility]  Service: General;  Laterality: Right;   [place]  [place] [facility]  Home Medications    Prior to Admission medications   Medication Sig Start Date End Date Taking? Authorizing Provider  acetaminophen (TYLENOL) 500 MG tablet Take 1,000 mg by mouth every 8 (eight) hours as needed for moderate pain.    [provider]  aluminum hydroxide-magnesium carbonate (GAVISCON) 95-358 MG/15ML SUSP Take 30 mLs by mouth as needed for indigestion or heartburn.    [provider]  amLODipine (NORVASC) 10 MG tablet Take 10 mg by mouth daily.    [provider]  ascorbic acid (VITAMIN C) 500 MG tablet Take 500 mg by mouth daily.    [provider]  aspirin 81 MG EC tablet Take 81 mg by mouth at bedtime.    [provider]  atorvastatin (LIPITOR) 40 MG  tablet Take 40 mg by mouth at bedtime.    [provider]  azelastine (ASTELIN) 0.1 % nasal spray Place 2 sprays into both nostrils 2 (two) times daily as needed for rhinitis. 06/03/21   Valentina Shaggy, MD  b complex vitamins tablet Take 1 tablet by mouth daily as needed.    [provider]  cholecalciferol (VITAMIN D3) 25 MCG (1000 UNIT) tablet Take 1,000 Units by mouth daily.    [provider]  Coenzyme Q10-levOCARNitine (CO Q-10 PLUS PO) Take 1 tablet by mouth daily at 12 noon.    [provider]  desloratadine (CLARINEX) 5 MG tablet Take 5 mg by mouth 2 (two) times daily.    [provider]  diclofenac Sodium (VOLTAREN) 1 % GEL Apply 4 g topically 4 (four) times daily. 07/03/21   Deno Etienne, DO  ECHINACEA EXTRACT PO Take 1 capsule by mouth daily as needed (cold symptoms).    [provider]  famotidine (PEPCID) 40 MG tablet Take 40 mg by mouth daily.    [provider]  fluticasone (FLONASE) 50 MCG/ACT nasal spray Place 1 spray into both nostrils 2 (two) times daily. 09/03/21   Valentina Shaggy, MD  Magnesium 250 MG TABS Take 250 mg by mouth at bedtime.    [provider]  Melatonin 5 MG CAPS Take 10 mg by mouth at bedtime.    [provider]  nebivolol (BYSTOLIC) 10 MG tablet TAKE 1 TABLET BY MOUTH EVERY DAY IN THE MORNING 08/13/21   Tolia, Sunit, DO  Nutritional Supplements (JUICE PLUS FIBRE PO) Take 2 capsules by mouth daily.    [provider]  Polyethylene Glycol 3350 (MIRALAX PO) Take by mouth daily.    [provider]  Probiotic Product (PROBIOTIC DAILY PO) Take 1 capsule by mouth daily.    [provider]  triamcinolone cream (KENALOG) 0.1 % Apply 1 application. topically 2 (two) times daily. 09/02/21   Valentina Shaggy, MD  valsartan-hydrochlorothiazide (DIOVAN-HCT) 320-12.5 MG tablet Take 1 tablet by mouth daily. 02/21/20   [provider]  zinc gluconate 50  MG tablet Take 50 mg by mouth daily as needed (supplement).    [provider]    Family History Family History  Problem Relation Age of Onset   Pancreatic cancer Mother    Prostate cancer Father     Social History Social History   Tobacco Use   Smoking status: Former    Years: 4.00    Types: Cigarettes    Quit date: 1973    Years since quitting: 50.4   Smokeless tobacco: Never  Vaping Use   Vaping Use: Never used  Substance Use Topics   Alcohol use:  Never   Drug use: Never     Allergies   Patient has no known allergies.   Review of Systems Review of Systems  Genitourinary:  Positive for flank pain.  As per HPI  Physical Exam Triage Vital Signs ED Triage Vitals  Enc Vitals Group     BP 09/20/21 1312 122/69     Pulse Rate 09/20/21 1312 76     Resp 09/20/21 1312 18     Temp 09/20/21 1312 98.5 F (36.9 C)     Temp src --      SpO2 09/20/21 1312 95 %     Weight --      Height --      Head Circumference --      Peak Flow --      Pain Score 09/20/21 1310 1     Pain Loc --      Pain Edu? --      Excl. in [place]? --    No data found.  Updated Vital Signs BP 122/69   Pulse 76   Temp 98.5 F (36.9 C)   Resp 18   SpO2 95%   Visual Acuity Right Eye Distance:   Left Eye Distance:   Bilateral Distance:    Right Eye Near:   Left Eye Near:    Bilateral Near:     Physical Exam Vitals and nursing note reviewed.  Constitutional:      General: He is not in acute distress.    Appearance: He is well-developed.  HENT:     Mouth/Throat:     Mouth: Mucous membranes are moist.     Pharynx: Oropharynx is clear.  Eyes:     Conjunctiva/sclera: Conjunctivae normal.  Cardiovascular:     Rate and Rhythm: Normal rate and regular rhythm.     Heart sounds: Normal heart sounds.  Pulmonary:     Effort: Pulmonary effort is normal. No respiratory distress.     Breath sounds: Normal breath sounds.  Abdominal:     General: Bowel sounds are normal.      Palpations: Abdomen is soft.     Tenderness: There is no abdominal tenderness. There is no right CVA tenderness or left CVA tenderness.  Musculoskeletal:        General: No tenderness.     Cervical back: Normal range of motion.     Comments: Nontender to palpation.  Denies any pain in clinic today.  States soreness is just above the left buttocks.  No flank pain, no CVA tenderness  Lymphadenopathy:     Cervical: No cervical adenopathy.  Neurological:     Mental Status: He is alert and oriented to person, place, and time.     Cranial Nerves: No facial asymmetry.     Sensory: Sensation is intact.     Motor: Motor function is intact.     Coordination: Coordination is intact.     Gait: Gait is intact.     UC Treatments / Results  Labs (all labs ordered are listed, but only abnormal results are displayed) Labs Reviewed  POCT URINALYSIS DIPSTICK, ED / UC    EKG  Radiology No results found.  Procedures Procedures (including critical care time)  Medications Ordered in UC Medications - No data to display  Initial Impression / Assessment and Plan / UC Course  I have reviewed the triage vital signs and the nursing notes.  Pertinent labs & imaging results that were available during my care of the patient were reviewed by  me and considered in my medical decision making (see chart for details).  Urinalysis in clinic today negative.  Patient has no pain today but wanted to get the soreness checked out as he was worried about another kidney stone.  He is nontender to palpation in the abdomen and back, no pain with back range of motion.  Physical exam is very reassuring.  He was very relieved to hear his urine was normal today.  States no other concerns, just wanted to get it checked out.  At this time recommend he can take Tylenol at home if pain or soreness returns, use hot pad or ice to the low back/buttock, and continue gentle stretching.  We discussed he should return or go to the  emergency department if he develops any worsening symptoms.  Patient agrees with plan and is discharged in stable condition.  Final Clinical Impressions(s) / UC Diagnoses   Final diagnoses:  Musculoskeletal back pain     Discharge Instructions      Continue tylenol as needed.  You can use ice or hot pad on your low back.  Continue gentle stretching.  Please return to the urgent care or emergency department if symptoms worsen or do not improve.     ED Prescriptions   None    PDMP not reviewed this encounter.   Jimia Gentles, [place], [place] 09/20/21 1425

## 2021-09-20 NOTE — ED Triage Notes (Signed)
Reports a mild hurting on Lt flank area. Pt reports a previous kidney stone.

## 2021-10-30 ENCOUNTER — Ambulatory Visit: Payer: Medicare PPO | Admitting: Allergy & Immunology

## 2021-11-24 ENCOUNTER — Other Ambulatory Visit: Payer: Self-pay | Admitting: Cardiology

## 2021-11-24 DIAGNOSIS — R5383 Other fatigue: Secondary | ICD-10-CM

## 2021-11-24 DIAGNOSIS — I10 Essential (primary) hypertension: Secondary | ICD-10-CM

## 2021-12-02 ENCOUNTER — Other Ambulatory Visit: Payer: Self-pay | Admitting: Family Medicine

## 2021-12-03 ENCOUNTER — Other Ambulatory Visit: Payer: Self-pay

## 2022-02-16 ENCOUNTER — Other Ambulatory Visit: Payer: Self-pay | Admitting: Cardiology

## 2022-02-16 ENCOUNTER — Other Ambulatory Visit: Payer: Self-pay | Admitting: Allergy & Immunology

## 2022-02-16 DIAGNOSIS — R5383 Other fatigue: Secondary | ICD-10-CM

## 2022-02-16 DIAGNOSIS — I10 Essential (primary) hypertension: Secondary | ICD-10-CM

## 2022-03-05 ENCOUNTER — Ambulatory Visit: Payer: Medicare PPO | Admitting: Allergy & Immunology

## 2022-04-02 ENCOUNTER — Encounter (HOSPITAL_COMMUNITY): Payer: Self-pay

## 2022-04-02 ENCOUNTER — Ambulatory Visit (HOSPITAL_COMMUNITY)
Admission: EM | Admit: 2022-04-02 | Discharge: 2022-04-02 | Disposition: A | Discharge status: Home / Self Care | Payer: Medicare PPO | Attending: Internal Medicine | Admitting: Internal Medicine

## 2022-04-02 DIAGNOSIS — R0981 Nasal congestion: Secondary | ICD-10-CM

## 2022-04-02 NOTE — ED Triage Notes (Signed)
Pt present to the office for sore throat x 3 days. Pt reports feeling like he has a sinus infection.

## 2022-04-02 NOTE — ED Provider Notes (Signed)
[facility] [facility]    CSN: [id number] Arrival date & time: 04/02/22  1940      History   Chief Complaint No chief complaint on file.   HPI Kenneth Daniel is a 76 y.o. male comes to the urgent care with sore throat of 3 days duration.  Patient endorses nasal congestion and mild postnasal drainage several days ago.  Over the past 3 days he has had some sore throat and throat dryness.  He has been using azelastine and Zyrtec with no improvement in his symptoms.  He denies any shortness of breath, wheezing, chest pain, nausea or vomiting.    No fever or chills.    Patient denies any sick contacts but he was at an event with a large number of people.  No nausea, vomiting or diarrhea.  No wheezing.  Patient has a history of seasonal allergies and is currently using azelastine and fluticasone nasal spray as well as Zyrtec.  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, right    per last duplex in epic 06-04-2020  right ICA 16-49% and right ECA >50%   Chronic constipation    GERD (gastroesophageal reflux disease)    History of acute pyelonephritis 09/2020   w/ admission in epic due to severe sepsis   History of COVID-19 11/18/2020   positive result in epic;   per pt mild to moderate symptoms that resolved   History of external beam radiation therapy    completed in 2015 for recurrent prostate cancer (done in [facility])   History of prostate cancer    (current urologist-- dr Milford Cage, and current treatment lupron injeciton q 18month) ;   [place]  s/p [place];   recurrence 2015 completed radiation   HLD (hyperlipidemia)    Hypertension    followed by pcp and cardiology---   (05-29-2020  nuclear stress test in epic , low risk no ischemia nuclear ef 63%)   Inguinal hernia, right    Non-rheumatic aortic regurgitation    mild to moderate AR  without stenosis per last echo in epic 02-27-2021   Perennial allergic rhinitis    [facility] dr [facility])   Wears dentures    upper   Wears glasses    Wears hearing aid in both ears     Patient Active Problem List   Diagnosis Date Noted   Nonrheumatic aortic valve insufficiency    Perennial allergic rhinitis 12/12/2020   Pruritus 12/12/2020   Seasonal allergic conjunctivitis 12/12/2020   Acute pyelonephritis 09/22/2020   Pyelonephritis 09/22/2020   [place]el[place] 11/09/2019    Past Surgical History:  Procedure Laterality Date   ANTERIOR CERVICAL DECOMP/DISCECTOMY FUSION N/A 11/09/2019   Procedure: ANTERIOR CERVICAL DECOMPRESSION FUSION CERVICAL 3-4 WITH INSTRUMENTATION AND ALLOGRAFT;  Surgeon: DPhylliss Bob MD;  [place] [facility]  Service: Orthopedics;  Laterality: N/A;   CARDIAC [place]   per pt stress test with possible ischemia,  told arteries normal   COLONOSCOPY  2020   CYST EXCISION  2005   per pt from back , was benign   INGUINAL HERNIA REPAIR Right 06/04/2021   Procedure: OPEN RIGHT INGUINAL HERNIA REPAIR;  Surgeon: CClovis Riley MD;  [facility]  Service: General;  Laterality: Right;   PROSTATECTOMY  2004  in [place], [place] [facility] ENDOSCOPY  2021       Home Medications    Prior to Admission medications   Medication Sig Start Date End Date Taking? Authorizing Provider  acetaminophen (TYLENOL) 500 MG tablet Take 1,000 mg by mouth every 8 (eight) hours as needed for moderate pain.    [provider]  aluminum hydroxide-magnesium carbonate (GAVISCON) 95-358 MG/15ML SUSP Take 30 mLs by mouth as needed for indigestion or heartburn.    [provider]  amLODipine (NORVASC) 10 MG tablet Take 10 mg by mouth daily.    [provider]  ascorbic acid (VITAMIN C) 500 MG tablet Take 500 mg by mouth daily.    [provider]  aspirin 81 MG EC tablet Take  81 mg by mouth at bedtime.    [provider]  atorvastatin (LIPITOR) 40 MG tablet Take 40 mg by mouth at bedtime.    [provider]  Azelastine HCl 137 MCG/SPRAY SOLN PLACE 2 SPRAYS INTO BOTH NOSTRILS 2 (TWO) TIMES DAILY 02/16/22   Valentina Shaggy, MD  b complex vitamins tablet Take 1 tablet by mouth daily as needed.    [provider]  cholecalciferol (VITAMIN D3) 25 MCG (1000 UNIT) tablet Take 1,000 Units by mouth daily.    [provider]  Coenzyme Q10-levOCARNitine (CO Q-10 PLUS PO) Take 1 tablet by mouth daily at 12 noon.    [provider]  [employer] COQ-10 200 MG capsule Take 400 mg by mouth daily. 07/12/21   [provider]  desloratadine (CLARINEX) 5 MG tablet Take 5 mg by mouth 2 (two) times daily.    [provider]  diclofenac Sodium (VOLTAREN) 1 % GEL Apply 4 g topically 4 (four) times daily. 07/03/21   Deno Etienne, DO  ECHINACEA EXTRACT PO Take 1 capsule by mouth daily as needed (cold symptoms).    [provider]  famotidine (PEPCID) 40 MG tablet Take 40 mg by mouth daily.    [provider]  fluticasone (FLONASE) 50 MCG/ACT nasal spray USE 1 SPRAY IN [facility] NOSTRIL 2 TIMES A DAY 12/03/21   Valentina Shaggy, MD  hydrOXYzine (VISTARIL) 25 MG capsule Take 25 mg by mouth at bedtime as needed. 11/25/21   [provider]  Magnesium 250 MG TABS Take 250 mg by mouth at bedtime.    [provider]  Melatonin 5 MG CAPS Take 10 mg by mouth at bedtime.    [provider]  nebivolol (BYSTOLIC) 10 MG tablet TAKE 1 TABLET BY MOUTH EVERY DAY IN THE MORNING 02/18/22   Tolia, Sunit, DO  Nutritional Supplements (JUICE PLUS FIBRE PO) Take 2 capsules by mouth daily.    [provider]  Polyethylene Glycol 3350 (MIRALAX PO) Take by mouth daily.    [provider]  Probiotic Product (PROBIOTIC DAILY PO) Take 1 capsule by mouth daily.    [provider]  triamcinolone cream  (KENALOG) 0.1 % Apply 1 application. topically 2 (two) times daily. 09/02/21   Valentina Shaggy, MD  valsartan-hydrochlorothiazide (DIOVAN-HCT) 320-12.5 MG tablet Take 1 tablet by mouth daily. 02/21/20   [provider]  zinc gluconate 50 MG tablet Take 50 mg by mouth daily as needed (supplement).    [provider]    Family History Family History  Problem Relation Age of Onset   Pancreatic cancer Mother    Prostate cancer Father     Social History Social History   Tobacco Use  Smoking status: Former    Years: 4.00    Types: Cigarettes    Quit date: 1973    Years since quitting: 50.9   Smokeless tobacco: Never  Vaping Use   Vaping Use: Never used  Substance Use Topics   Alcohol use: Never   Drug use: Never     Allergies   Patient has no known allergies.   Review of Systems Review of Systems  Constitutional: Negative.   HENT:  Positive for congestion, postnasal drip and sore throat. Negative for sinus pressure, sinus pain, trouble swallowing and voice change.   Respiratory: Negative.  Negative for cough, shortness of breath and wheezing.   Gastrointestinal: Negative.   Musculoskeletal: Negative.   Neurological: Negative.      Physical Exam Triage Vital Signs ED Triage Vitals  Enc Vitals Group     BP 04/02/22 1952 (!) 152/85     Pulse Rate 04/02/22 1952 82     Resp 04/02/22 1952 18     Temp 04/02/22 1952 97.8 F (36.6 C)     Temp Source 04/02/22 1952 Oral     SpO2 04/02/22 1952 98 %     Weight --      Height --      Head Circumference --      Peak Flow --      Pain Score 04/02/22 1954 3     Pain Loc --      Pain Edu? --      Excl. in [place]? --    No data found.  Updated Vital Signs BP (!) 152/85 (BP Location: Left Arm)   Pulse 82   Temp 97.8 F (36.6 C) (Oral)   Resp 18   SpO2 98%   Visual Acuity Right Eye Distance:   Left Eye Distance:   Bilateral Distance:    Right Eye Near:   Left Eye Near:    Bilateral Near:      Physical Exam Vitals and nursing note reviewed.  Constitutional:      General: He is not in acute distress.    Appearance: He is not ill-appearing.  HENT:     Right Ear: Tympanic membrane normal.     Left Ear: Tympanic membrane normal.     Nose: Congestion present.     Mouth/Throat:     Mouth: Mucous membranes are moist.     Pharynx: No posterior oropharyngeal erythema.  Cardiovascular:     Rate and Rhythm: Normal rate and regular rhythm.     Pulses: Normal pulses.     Heart sounds: Normal heart sounds.  Pulmonary:     Effort: Pulmonary effort is normal. No respiratory distress.     Breath sounds: Normal breath sounds. No rhonchi or rales.  Abdominal:     General: Bowel sounds are normal.     Palpations: Abdomen is soft.  Neurological:     Mental Status: He is alert.      UC Treatments / Results  Labs (all labs ordered are listed, but only abnormal results are displayed) Labs Reviewed - No data to display  EKG   Radiology No results found.  Procedures Procedures (including critical care time)  Medications Ordered in UC Medications - No data to display  Initial Impression / Assessment and Plan / UC Course  I have reviewed the triage vital signs and the nursing notes.  Pertinent labs & imaging results that were available during my care of the patient were reviewed by me and considered in my  medical decision making (see chart for details).     1.  Nasal congestion with mild sore throat: Patient is advised to continue using his current medications.  Humidified air and VapoRub use will help with throat discomfort and grinding No indication for COVID or flu testing given the duration of symptoms Tylenol/Motrin as needed for pain and/or fever Return precautions given. Final Clinical Impressions(s) / UC Diagnoses   Final diagnoses:  Nasal congestion     Discharge Instructions      Please stay hydrated Humidifier use will help with your throat discomfort  or dryness Continue using Azelastine and Flonase as well as cetirizine Follow-up with your allergist No indication for COVID or flu testing given the duration of symptoms. Return to urgent care if you have any further concerns or worsening symptoms.     ED Prescriptions   None    PDMP not reviewed this encounter.   Chase Picket, MD 04/05/22 2041

## 2022-04-02 NOTE — Discharge Instructions (Addendum)
Please stay hydrated Humidifier use will help with your throat discomfort or dryness Continue using Azelastine and Flonase as well as cetirizine Follow-up with your allergist No indication for COVID or flu testing given the duration of symptoms. Return to urgent care if you have any further concerns or worsening symptoms.

## 2022-04-04 NOTE — Patient Instructions (Incomplete)
Allergic rhinitis (grasses, indoor molds, dust mites) - Continue fluticasone 1 spray in each nostril twice a day as needed for stuffy nose.   - Continue azelastine 2 sprays in each nostril twice a day as needed for runny nose or itch. - Continue Clarinex 5 mg 1-2 times daily. - Consider saline nasal rinses as needed for nasal symptoms.  - Consider starting allergy shots for long term control. - DEFINITELY take a Clarinex TWICE DAILY on nail salon days.   2. Pruritus - Continue a twice a day moisturizing routine - Continue Clarinex once a day as needed for itch (as listed above). - Continue triamcinolone cream sparingly twice a day as  needed to red itchy areas (avoid the face, neck, groin, or armpit region).  3.Acute sinus infection Start Augmentin 875 mg taking 1 tablet twice a day for 7 days Continue medications as above for allergic rhinitis  If you continue to have a sore throat recommend going to urgent care where you can be tested for strep throat   Schedule  a follow up appointment in 3-4 months or sooner if needed

## 2022-04-06 ENCOUNTER — Other Ambulatory Visit: Payer: Self-pay

## 2022-04-06 ENCOUNTER — Ambulatory Visit: Payer: Medicare PPO | Admitting: Family

## 2022-04-06 ENCOUNTER — Encounter: Payer: Self-pay | Admitting: Family

## 2022-04-06 VITALS — BP 110/62 | HR 95 | Temp 98.1°F | Resp 20 | Ht 74.0 in | Wt 189.4 lb

## 2022-04-06 DIAGNOSIS — J3089 Other allergic rhinitis: Secondary | ICD-10-CM

## 2022-04-06 DIAGNOSIS — J302 Other seasonal allergic rhinitis: Secondary | ICD-10-CM

## 2022-04-06 DIAGNOSIS — L299 Pruritus, unspecified: Secondary | ICD-10-CM | POA: Diagnosis not present

## 2022-04-06 DIAGNOSIS — J019 Acute sinusitis, unspecified: Secondary | ICD-10-CM | POA: Diagnosis not present

## 2022-04-06 MED ORDER — AMOXICILLIN-POT CLAVULANATE 875-125 MG PO TABS: 1.0000 | ORAL_TABLET | Freq: Two times a day (BID) | ORAL | 0 refills | Status: DC

## 2022-04-06 NOTE — Progress Notes (Signed)
[place] [address] Dept: [phone]  FOLLOW UP NOTE  Patient ID: Kenneth Daniel, male    DOB: [birth year]  Age: 76 y.o. MRN: [id number] Date of Office Visit: 04/06/2022  Assessment  Chief Complaint: Nasal Congestion (Started on 03/28/22) and Cough  HPI Kenneth Daniel is a 76 year old male who presents today for an acute visit of cold/cough/sore throat.  Symptoms started March 28, 2022.  He was last seen on Sep 02, 2021 by Dr. Ernst Bowler for allergic rhinitis and pruritus.  He denies any new diagnosis or surgery since his last office visit.  Allergic rhinitis: He reports for the past 8 to 9 days he has had sore throat, cough, runny nose, mild headache that stopped Saturday, nasal congestion in the morning, and feeling a little weak.  He is noticed his temperature has been around 99.1 F.  He denies postnasal drip.  He reports the post nasal drip is dry now.  On April 03, 2022 he did go to urgent care due to these symptoms and was instructed to continue his fluticasone and azelastine nasal spray.  He has not been tested for COVID-19.  He also continues to take Clarinex 5 mg twice a day.  He has not had any sinus infections since we last saw him.  He reports that he feels like his body is under attack.  He denies any allergies to antibiotics.  Pruritus is reported as doing better with Clarinex twice a day and triamcinolone cream as needed.   Drug Allergies:  No Known Allergies  Review of Systems: Review of Systems  Constitutional:        Reports temperatures around 99.1 degrees Fahrenheit  HENT:         Reports sore throat, runny nose with sometimes thick yellow/clear with a little bit of blood drainage, nasal congestion in the morning and denies postnasal drip.  Eyes:        Denies itchy watery eyes  Respiratory:  Positive for cough. Negative for shortness of breath and wheezing.        Reports cough.  Denies any history of asthma.  Cardiovascular:  Negative  for chest pain and palpitations.  Gastrointestinal:        Denies heartburn or reflux symptoms while on reflux medicine once a day  Genitourinary:  Positive for frequency.       Reports he has had his prostate removed  Skin:        Reports itching is much better  Neurological:  Positive for headaches.       Reports mild headaches, but has not had one since Saturday.  Endo/Heme/Allergies:  Positive for environmental allergies.     Physical Exam: BP 110/62   Pulse 95   Temp 98.1 F (36.7 C)   Resp 20   Ht '6\' 2"'$  (1.88 m)   Wt 189 lb 6.4 oz (85.9 kg)   SpO2 97%   BMI 24.32 kg/m    Physical Exam Constitutional:      Appearance: Normal appearance.     Comments: Wears hearing aids  HENT:     Head: Normocephalic and atraumatic.     Comments: Pharynx normal, eyes normal, ears: Unable to see left tympanic membrane due to cerumen.  Right ear normal, nose: Bilateral lower turbinates moderately edematous and slightly erythematous with clear drainage noted    Right Ear: Tympanic membrane, ear canal and external ear normal.     Left Ear: Ear canal and external ear normal.  Mouth/Throat:     Mouth: Mucous membranes are moist.     Pharynx: Oropharynx is clear.  Eyes:     Conjunctiva/sclera: Conjunctivae normal.  Cardiovascular:     Rate and Rhythm: Regular rhythm.     Heart sounds: Normal heart sounds.  Pulmonary:     Effort: Pulmonary effort is normal.     Breath sounds: Normal breath sounds.     Comments: Lungs clear to auscultation Musculoskeletal:     Cervical back: Neck supple.  Skin:    General: Skin is warm.  Neurological:     Mental Status: He is alert and oriented to person, place, and time.  Psychiatric:        Mood and Affect: Mood normal.        Behavior: Behavior normal.        Thought Content: Thought content normal.        Judgment: Judgment normal.     Diagnostics:  None  Assessment and Plan: 1. Acute non-recurrent sinusitis, unspecified location    2. Seasonal and perennial allergic rhinitis   3. Itching     Meds ordered this encounter  Medications   amoxicillin-clavulanate (AUGMENTIN) 875-125 MG tablet    Sig: Take 1 tablet by mouth 2 (two) times daily.    Dispense:  14 tablet    Refill:  0    Patient Instructions  Allergic rhinitis (grasses, indoor molds, dust mites) - Continue fluticasone 1 spray in each nostril twice a day as needed for stuffy nose.   - Continue azelastine 2 sprays in each nostril twice a day as needed for runny nose or itch. - Continue Clarinex 5 mg 1-2 times daily. - Consider saline nasal rinses as needed for nasal symptoms.  - Consider starting allergy shots for long term control. - DEFINITELY take a Clarinex TWICE DAILY on nail salon days.   2. Pruritus - Continue a twice a day moisturizing routine - Continue Clarinex once a day as needed for itch (as listed above). - Continue triamcinolone cream sparingly twice a day as  needed to red itchy areas (avoid the face, neck, groin, or armpit region).  3.Acute sinus infection Start Augmentin 875 mg taking 1 tablet twice a day for 7 days Continue medications as above for allergic rhinitis  If you continue to have a sore throat recommend going to urgent care where you can be tested for strep throat   Schedule  a follow up appointment in 3-4 months or sooner if needed     Return in about 3 months (around 07/06/2022), or if symptoms worsen or fail to improve.    Thank you for the opportunity to care for this patient.  Please do not hesitate to contact me with questions.  Althea Charon, FNP [facility] [place] [facility]

## 2022-04-09 ENCOUNTER — Ambulatory Visit: Payer: Medicare PPO | Admitting: Allergy & Immunology

## 2022-04-09 ENCOUNTER — Encounter: Payer: Self-pay | Admitting: Allergy & Immunology

## 2022-04-09 VITALS — BP 110/68 | HR 80 | Temp 97.8°F | Resp 18

## 2022-04-09 DIAGNOSIS — L299 Pruritus, unspecified: Secondary | ICD-10-CM

## 2022-04-09 DIAGNOSIS — J3089 Other allergic rhinitis: Secondary | ICD-10-CM

## 2022-04-09 MED ORDER — FLUTICASONE PROPIONATE 50 MCG/ACT NA SUSP: 2.0000 | Freq: Every day | NASAL | 5 refills | Status: DC

## 2022-04-09 MED ORDER — AZELASTINE HCL 137 MCG/SPRAY NA SOLN: NASAL | 5 refills | Status: DC

## 2022-04-09 NOTE — Progress Notes (Signed)
FOLLOW UP  Date of Service/Encounter:  04/09/22   Assessment:   Chronic rhinitis (grass, indoor molds, dust mites) - s/p 30 yrs allergen immunotherapy when he lived in [place], but now with worsening allergic rhinitis symptoms as of late and interested in starting allergy shots once again   Itching - improved  Plan/Recommendations:   Allergic rhinitis (grasses, indoor molds, dust mites) - Continue fluticasone 1 spray in each nostril twice a day as needed for stuffy nose.   - Continue azelastine 2 sprays in each nostril twice a day as needed for runny nose or itch. - Continue Clarinex 5 mg 1-2 times daily. - Consider saline nasal rinses as needed for nasal symptoms.  - Consider starting allergy shots for long term control. - Allergy shot consent signed today. - Make an appointment to start shots in the next few weeks.   2. Pruritus - Continue a twice a day moisturizing routine - Continue Clarinex once a day as needed for itch (as listed above). - Continue with triamcinolone cream to use as needed (avoid the face).  3. Return in about 6 months (around 10/09/2022). Make an appointment to start allergy shots as well.   Subjective:   Kenneth Daniel is a 76 y.o. male presenting today for follow up of  Chief Complaint  Patient presents with   Allergic Rhinitis    Pruritus   Other    Is doing better from his sinus infection but he is still having a cough here and there.     Kenneth Daniel has a history of the following: Patient Active Problem List   Diagnosis Date Noted   Nonrheumatic aortic valve insufficiency    Perennial allergic rhinitis 12/12/2020   Pruritus 12/12/2020   Seasonal allergic conjunctivitis 12/12/2020   Acute pyelonephritis 09/22/2020   Pyelonephritis 09/22/2020   Myelopathy ([place]) 11/09/2019    History obtained from: chart review and patient.  Kenneth Daniel is a 76 y.o. male presenting for a follow up visit. He saw Althea Charon on Monday. He was  continued on fluticasone as well azelastine and Clarinea. Allergy shots were discussed. For his pruritus, he was continued on the moisturizing as well as Clarinex and the TAC. He was started on Augmentin twice daily for 7 days.   In the interim, he has done well. He feels that the Augmentin did the trick. It started working right away and the fatigue improved. He did not have any more headaches or sinus problems. He does have some dryness in his throat but nothing like what he had before.   This appointment today was already scheduled which is why he is here today. He wanted all of the follow up he could get. This is the worst he has felt since he moved here from [place].   His last antibiotic was from last November. It was around Thanksgiving and he was diagnosed with a sinus infection.   He is not on allergy shots, but he was on them for years when he lived in [place]. It was around two months from when he came here and he felt fine, so we made the decision to stop the shots. He is interested in starting them once again.   Otherwise, there have been no changes to his past medical history, surgical history, family history, or social history.    Review of Systems  Constitutional: Negative.  Negative for chills, fever, malaise/fatigue and weight loss.  HENT:  Positive for congestion. Negative for ear discharge, ear pain,  nosebleeds and sinus pain.        Positive for postnasal drip.   Eyes:  Negative for pain, discharge and redness.  Respiratory:  Negative for cough, sputum production, shortness of breath and wheezing.   Cardiovascular: Negative.  Negative for chest pain and palpitations.  Gastrointestinal:  Negative for abdominal pain, constipation, diarrhea, heartburn, nausea and vomiting.  Skin: Negative.  Negative for itching and rash.  Neurological:  Negative for dizziness and headaches.  Endo/Heme/Allergies:  Positive for environmental allergies. Does not bruise/bleed easily.        Objective:   Blood pressure 110/68, pulse 80, temperature 97.8 F (36.6 C), resp. rate 18, SpO2 97 %. There is no height or weight on file to calculate BMI.    Physical Exam Vitals reviewed.  Constitutional:      Appearance: He is well-developed.     Comments: Talkative and pleasant.   HENT:     Head: Normocephalic and atraumatic.     Right Ear: External ear normal.     Left Ear: External ear normal.     Ears:     Comments: Hearing aids in place bilaterally.    Nose: Mucosal edema and rhinorrhea present. No nasal deformity or septal deviation.     Right Turbinates: Enlarged, swollen and pale.     Left Turbinates: Enlarged, swollen and pale.     Right Sinus: No maxillary sinus tenderness or frontal sinus tenderness.     Left Sinus: No maxillary sinus tenderness or frontal sinus tenderness.     Comments: Turbinates are erythematous.    Mouth/Throat:     Mouth: Mucous membranes are not pale and not dry.     Pharynx: Uvula midline.  Eyes:     General: Lids are normal. No allergic shiner.       Right eye: No discharge.        Left eye: No discharge.     Conjunctiva/sclera: Conjunctivae normal.     Right eye: Right conjunctiva is not injected. No chemosis.    Left eye: Left conjunctiva is not injected. No chemosis.    Pupils: Pupils are equal, round, and reactive to light.  Cardiovascular:     Rate and Rhythm: Normal rate and regular rhythm.     Heart sounds: Normal heart sounds.  Pulmonary:     Effort: Pulmonary effort is normal. No tachypnea, accessory muscle usage or respiratory distress.     Breath sounds: Normal breath sounds. No wheezing, rhonchi or rales.  Chest:     Chest wall: No tenderness.  Lymphadenopathy:     Cervical: No cervical adenopathy.  Skin:    General: Skin is warm.     Capillary Refill: Capillary refill takes less than 2 seconds.     Coloration: Skin is not pale.     Findings: No abrasion, erythema, petechiae or rash. Rash is not papular,  urticarial or vesicular.     Comments: No eczematous or urticarial lesions noted.  Neurological:     Mental Status: He is alert.  Psychiatric:        Behavior: Behavior is cooperative.      Diagnostic studies: none     Salvatore Marvel, MD  [facility] [facility] [place]

## 2022-04-09 NOTE — Patient Instructions (Addendum)
Allergic rhinitis (grasses, indoor molds, dust mites) - Continue fluticasone 1 spray in each nostril twice a day as needed for stuffy nose.   - Continue azelastine 2 sprays in each nostril twice a day as needed for runny nose or itch. - Continue Clarinex 5 mg 1-2 times daily. - Consider saline nasal rinses as needed for nasal symptoms.  - Consider starting allergy shots for long term control. - Allergy shot consent signed today. - Make an appointment to start shots in the next few weeks.   2. Pruritus - Continue a twice a day moisturizing routine - Continue Clarinex once a day as needed for itch (as listed above). - Continue with triamcinolone cream to use as needed (avoid the face).  3. Return in about 6 months (around 10/09/2022). Make an appointment to start allergy shots as well.    Please inform us of any Emergency Department visits, hospitalizations, or changes in symptoms. Call us before going to the ED for breathing or allergy symptoms since we might be able to fit you in for a sick visit. Feel free to contact us anytime with any questions, problems, or concerns.  It was a pleasure to see you again today!  Websites that have reliable patient information: 1. American Academy of Asthma, Allergy, and Immunology: www.aaaai.org 2. Food Allergy Research and Education (FARE): foodallergy.org 3. Mothers of Asthmatics: http://www.asthmacommunitynetwork.org 4. American College of Allergy, Asthma, and Immunology: www.acaai.org   COVID-19 Vaccine Information can be found at: [email or web address].co.uk For questions related to vaccine distribution or appointments, please email [email or web address] or call [phone].   We realize that you might be concerned about having an allergic reaction to the COVID19 vaccines. To help with that concern, WE ARE OFFERING THE COVID19 VACCINES IN OUR OFFICE! Ask the front desk for dates!     "Like" [place]  on Facebook and Instagram for our latest updates!      A healthy democracy works best when [employer] participate! Make sure you are registered to vote! If you have moved or changed any of your contact information, you will need to get this updated before voting!  In some cases, you MAY be able to register to vote online: [email or web address]     Allergy Shots   Allergies are the result of a chain reaction that starts in the immune system. Your immune system controls how your body defends itself. For instance, if you have an allergy to pollen, your immune system identifies pollen as an invader or allergen. Your immune system overreacts by producing antibodies called Immunoglobulin E (IgE). These antibodies travel to cells that release chemicals, causing an allergic reaction.  The concept behind allergy immunotherapy, whether it is received in the form of shots or tablets, is that the immune system can be desensitized to specific allergens that trigger allergy symptoms. Although it requires time and patience, the payback can be long-term relief.  How Do Allergy Shots Work?  Allergy shots work much like a vaccine. Your body responds to injected amounts of a particular allergen given in increasing doses, eventually developing a resistance and tolerance to it. Allergy shots can lead to decreased, minimal or no allergy symptoms.  There generally are two phases: build-up and maintenance. Build-up often ranges from three to six months and involves receiving injections with increasing amounts of the allergens. The shots are typically given once or twice a week, though more rapid build-up schedules are sometimes used.  The maintenance phase begins when the most effective dose is reached. This dose  is different for each person, depending on how allergic you are and your response to the build-up injections. Once the maintenance dose is reached, there are longer periods between  injections, typically two to four weeks.  Occasionally doctors give cortisone-type shots that can temporarily reduce allergy symptoms. These types of shots are different and should not be confused with allergy immunotherapy shots.  Who Can Be Treated with Allergy Shots?  Allergy shots may be a good treatment approach for people with allergic rhinitis (hay fever), allergic asthma, conjunctivitis (eye allergy) or stinging insect allergy.   Before deciding to begin allergy shots, you should consider:   The length of allergy season and the severity of your symptoms  Whether medications and/or changes to your environment can control your symptoms  Your desire to avoid long-term medication use  Time: allergy immunotherapy requires a major time commitment  Cost: may vary depending on your insurance coverage  Allergy shots for children age 34 and older are effective and often well tolerated. They might prevent the onset of new allergen sensitivities or the progression to asthma.  Allergy shots are not started on patients who are pregnant but can be continued on patients who become pregnant while receiving them. In some patients with other medical conditions or who take certain common medications, allergy shots may be of risk. It is important to mention other medications you talk to your allergist.   When Will I Feel Better?  Some may experience decreased allergy symptoms during the build-up phase. For others, it may take as long as 12 months on the maintenance dose. If there is no improvement after a year of maintenance, your allergist will discuss other treatment options with you.  If you aren't responding to allergy shots, it may be because there is not enough dose of the allergen in your vaccine or there are missing allergens that were not identified during your allergy testing. Other reasons could be that there are high levels of the allergen in your environment or major exposure to  non-allergic triggers like tobacco smoke.  What Is the Length of Treatment?  Once the maintenance dose is reached, allergy shots are generally continued for three to five years. The decision to stop should be discussed with your allergist at that time. Some people may experience a permanent reduction of allergy symptoms. Others may relapse and a longer course of allergy shots can be considered.  What Are the Possible Reactions?  The two types of adverse reactions that can occur with allergy shots are local and systemic. Common local reactions include very mild redness and swelling at the injection site, which can happen immediately or several hours after. A systemic reaction, which is less common, affects the entire body or a particular body system. They are usually mild and typically respond quickly to medications. Signs include increased allergy symptoms such as sneezing, a stuffy nose or hives.  Rarely, a serious systemic reaction called anaphylaxis can develop. Symptoms include swelling in the throat, wheezing, a feeling of tightness in the chest, nausea or dizziness. Most serious systemic reactions develop within 30 minutes of allergy shots. This is why it is strongly recommended you wait in your doctor's office for 30 minutes after your injections. Your allergist is trained to watch for reactions, and his or her staff is trained and equipped with the proper medications to identify and treat them.  Who Should Administer Allergy Shots?  The preferred location for receiving shots is your prescribing allergist's office. Injections can sometimes  be given at another facility where the physician and staff are trained to recognize and treat reactions, and have received instructions by your prescribing allergist.

## 2022-04-13 ENCOUNTER — Encounter: Payer: Self-pay | Admitting: Allergy & Immunology

## 2022-04-13 DIAGNOSIS — J302 Other seasonal allergic rhinitis: Secondary | ICD-10-CM

## 2022-04-13 NOTE — Progress Notes (Deleted)
Aeroallergen Immunotherapy  Ordering Provider: Dr. Salvatore Marvel  Patient Details Name: Kenneth Daniel MRN: [id number] Date of Birth: Jul 10, 1945  Order 1 of 2  Vial Label: Molds  0.2 ml (Volume)  1:20 Concentration -- Cladosporium herbarum 0.2 ml (Volume)  1:10 Concentration -- Aspergillus mix 0.2 ml (Volume)  1:10 Concentration -- Penicillium mix 0.2 ml (Volume)  1:40 Concentration -- Phoma betae   0.8  ml Extract Subtotal 4.2  ml Diluent 5.0  ml Maintenance Total  Schedule:  B  Blue Vial (1:100,000): Schedule B (6 doses) Yellow Vial (1:10,000): Schedule B (6 doses) Green Vial (1:1,000): Schedule B (6 doses) Red Vial (1:100): Schedule A (14 doses)  Special Instructions: none

## 2022-04-13 NOTE — Progress Notes (Signed)
Aeroallergen Immunotherapy  Ordering Provider: Dr. Salvatore Marvel  Patient Details Name: Kenneth Daniel MRN: [id number] Date of Birth: February 06, 1946  Order 2 of 2  Vial Label: G/DM  0.3 ml (Volume)  BAU Concentration -- 7 Grass Mix* 100,000 ([id number] [place], [place], [place], Perennial [place], RedTop, Sweet Vernal, Timothy) 0.2 ml (Volume)  1:20 Concentration -- Johnson 0.7 ml (Volume)   AU Concentration -- Mite Mix (DF 5,000 & DP 5,000)    1.2 ml Extract Subtotal 3.8  ml Diluent 5.0  ml Maintenance Total  Schedule:  B  Blue Vial (1:100,000): Schedule B (6 doses) Yellow Vial (1:10,000): Schedule B (6 doses) Green Vial (1:1,000): Schedule B (6 doses) Red Vial (1:100): Schedule A (14 doses)  Special Instructions: none

## 2022-04-13 NOTE — Progress Notes (Signed)
Aeroallergen Immunotherapy  Ordering Provider: Dr. Salvatore Marvel  Patient Details Name: Kenneth Daniel MRN: [id number] Date of Birth: [birth year]  Order 1 of 2  Vial Label: Molds  0.2 ml (Volume)  1:20 Concentration -- Cladosporium herbarum 0.2 ml (Volume)  1:10 Concentration -- Aspergillus mix 0.2 ml (Volume)  1:10 Concentration -- Penicillium mix 0.2 ml (Volume)  1:40 Concentration -- Phoma betae   0.8  ml Extract Subtotal 4.2  ml Diluent 5.0  ml Maintenance Total  Schedule:  B  Blue Vial (1:100,000): Schedule B (6 doses) Yellow Vial (1:10,000): Schedule B (6 doses) Green Vial (1:1,000): Schedule B (6 doses) Red Vial (1:100): Schedule A (14 doses)  Special Instructions: none

## 2022-04-13 NOTE — Progress Notes (Signed)
VIALS EXP 04-14-23

## 2022-04-14 DIAGNOSIS — J3089 Other allergic rhinitis: Secondary | ICD-10-CM

## 2022-05-01 ENCOUNTER — Ambulatory Visit (INDEPENDENT_AMBULATORY_CARE_PROVIDER_SITE_OTHER): Payer: Medicare PPO | Admitting: *Deleted

## 2022-05-01 DIAGNOSIS — J309 Allergic rhinitis, unspecified: Secondary | ICD-10-CM

## 2022-05-01 MED ORDER — EPINEPHRINE 0.3 MG/0.3ML IJ SOAJ: INTRAMUSCULAR | 3 refills | Status: DC

## 2022-05-01 NOTE — Progress Notes (Signed)
Immunotherapy   Patient Details  Name: Kenneth Daniel MRN: [id number] Date of Birth: 1945-07-08  05/01/2022  Kenneth Daniel started injections for  G/DM and MOLD  Following schedule: B  Frequency:1 time per week Epi-Pen:Prescription for Epi-Pen given Consent signed and patient instructions given.   Kenneth Daniel 05/01/2022, 2:20 PM

## 2022-05-07 ENCOUNTER — Ambulatory Visit (INDEPENDENT_AMBULATORY_CARE_PROVIDER_SITE_OTHER): Payer: Medicare PPO

## 2022-05-07 DIAGNOSIS — J309 Allergic rhinitis, unspecified: Secondary | ICD-10-CM

## 2022-05-15 ENCOUNTER — Ambulatory Visit (INDEPENDENT_AMBULATORY_CARE_PROVIDER_SITE_OTHER): Payer: Medicare PPO

## 2022-05-15 DIAGNOSIS — J309 Allergic rhinitis, unspecified: Secondary | ICD-10-CM | POA: Diagnosis not present

## 2022-05-20 ENCOUNTER — Other Ambulatory Visit: Payer: Self-pay | Admitting: Cardiology

## 2022-05-20 ENCOUNTER — Ambulatory Visit (INDEPENDENT_AMBULATORY_CARE_PROVIDER_SITE_OTHER): Payer: Medicare PPO

## 2022-05-20 DIAGNOSIS — J309 Allergic rhinitis, unspecified: Secondary | ICD-10-CM | POA: Diagnosis not present

## 2022-05-20 DIAGNOSIS — R5383 Other fatigue: Secondary | ICD-10-CM

## 2022-05-20 DIAGNOSIS — I10 Essential (primary) hypertension: Secondary | ICD-10-CM

## 2022-05-25 DIAGNOSIS — E7800 Pure hypercholesterolemia, unspecified: Secondary | ICD-10-CM | POA: Diagnosis not present

## 2022-05-25 DIAGNOSIS — K219 Gastro-esophageal reflux disease without esophagitis: Secondary | ICD-10-CM | POA: Diagnosis not present

## 2022-05-25 DIAGNOSIS — F1310 Sedative, hypnotic or anxiolytic abuse, uncomplicated: Secondary | ICD-10-CM | POA: Diagnosis not present

## 2022-05-25 DIAGNOSIS — I5030 Unspecified diastolic (congestive) heart failure: Secondary | ICD-10-CM | POA: Diagnosis not present

## 2022-05-25 DIAGNOSIS — C61 Malignant neoplasm of prostate: Secondary | ICD-10-CM | POA: Diagnosis not present

## 2022-05-25 DIAGNOSIS — I110 Hypertensive heart disease with heart failure: Secondary | ICD-10-CM | POA: Diagnosis not present

## 2022-05-25 DIAGNOSIS — M5000 Cervical disc disorder with myelopathy, unspecified cervical region: Secondary | ICD-10-CM | POA: Diagnosis not present

## 2022-05-25 DIAGNOSIS — R7303 Prediabetes: Secondary | ICD-10-CM | POA: Diagnosis not present

## 2022-05-25 DIAGNOSIS — Z0000 Encounter for general adult medical examination without abnormal findings: Secondary | ICD-10-CM | POA: Diagnosis not present

## 2022-05-28 ENCOUNTER — Ambulatory Visit (INDEPENDENT_AMBULATORY_CARE_PROVIDER_SITE_OTHER): Payer: Medicare PPO

## 2022-05-28 ENCOUNTER — Telehealth: Payer: Self-pay

## 2022-05-28 DIAGNOSIS — J309 Allergic rhinitis, unspecified: Secondary | ICD-10-CM

## 2022-05-28 NOTE — Telephone Encounter (Signed)
Error

## 2022-06-04 ENCOUNTER — Ambulatory Visit (INDEPENDENT_AMBULATORY_CARE_PROVIDER_SITE_OTHER): Payer: Medicare PPO

## 2022-06-04 DIAGNOSIS — J309 Allergic rhinitis, unspecified: Secondary | ICD-10-CM | POA: Diagnosis not present

## 2022-06-05 DIAGNOSIS — F40248 Other situational type phobia: Secondary | ICD-10-CM | POA: Diagnosis not present

## 2022-06-09 ENCOUNTER — Ambulatory Visit (INDEPENDENT_AMBULATORY_CARE_PROVIDER_SITE_OTHER): Payer: Medicare PPO

## 2022-06-09 DIAGNOSIS — J309 Allergic rhinitis, unspecified: Secondary | ICD-10-CM

## 2022-06-10 DIAGNOSIS — R208 Other disturbances of skin sensation: Secondary | ICD-10-CM | POA: Diagnosis not present

## 2022-06-10 DIAGNOSIS — L728 Other follicular cysts of the skin and subcutaneous tissue: Secondary | ICD-10-CM | POA: Diagnosis not present

## 2022-06-10 DIAGNOSIS — L538 Other specified erythematous conditions: Secondary | ICD-10-CM | POA: Diagnosis not present

## 2022-06-10 DIAGNOSIS — D1801 Hemangioma of skin and subcutaneous tissue: Secondary | ICD-10-CM | POA: Diagnosis not present

## 2022-06-16 ENCOUNTER — Ambulatory Visit (INDEPENDENT_AMBULATORY_CARE_PROVIDER_SITE_OTHER): Payer: Medicare PPO

## 2022-06-16 DIAGNOSIS — J309 Allergic rhinitis, unspecified: Secondary | ICD-10-CM | POA: Diagnosis not present

## 2022-06-23 ENCOUNTER — Ambulatory Visit (INDEPENDENT_AMBULATORY_CARE_PROVIDER_SITE_OTHER): Payer: Medicare PPO

## 2022-06-23 DIAGNOSIS — J309 Allergic rhinitis, unspecified: Secondary | ICD-10-CM

## 2022-06-28 ENCOUNTER — Other Ambulatory Visit: Payer: Self-pay | Admitting: Cardiology

## 2022-06-28 ENCOUNTER — Other Ambulatory Visit: Payer: Self-pay | Admitting: Allergy & Immunology

## 2022-06-28 DIAGNOSIS — I10 Essential (primary) hypertension: Secondary | ICD-10-CM

## 2022-06-28 DIAGNOSIS — R5383 Other fatigue: Secondary | ICD-10-CM

## 2022-06-30 ENCOUNTER — Ambulatory Visit (INDEPENDENT_AMBULATORY_CARE_PROVIDER_SITE_OTHER): Payer: Medicare PPO

## 2022-06-30 DIAGNOSIS — J309 Allergic rhinitis, unspecified: Secondary | ICD-10-CM

## 2022-07-03 ENCOUNTER — Encounter: Payer: Self-pay | Admitting: Cardiology

## 2022-07-03 ENCOUNTER — Other Ambulatory Visit: Payer: Self-pay | Admitting: Family Medicine

## 2022-07-03 ENCOUNTER — Ambulatory Visit: Payer: Medicare PPO | Admitting: Cardiology

## 2022-07-03 VITALS — BP 121/68 | HR 77 | Resp 18 | Ht 74.0 in | Wt 186.2 lb

## 2022-07-03 DIAGNOSIS — I10 Essential (primary) hypertension: Secondary | ICD-10-CM | POA: Diagnosis not present

## 2022-07-03 DIAGNOSIS — E7800 Pure hypercholesterolemia, unspecified: Secondary | ICD-10-CM

## 2022-07-03 DIAGNOSIS — Z8616 Personal history of COVID-19: Secondary | ICD-10-CM | POA: Diagnosis not present

## 2022-07-03 DIAGNOSIS — I351 Nonrheumatic aortic (valve) insufficiency: Secondary | ICD-10-CM

## 2022-07-03 DIAGNOSIS — Z87891 Personal history of nicotine dependence: Secondary | ICD-10-CM

## 2022-07-03 NOTE — Progress Notes (Signed)
ID:  Kenneth Daniel, DOB Jan 04, 1946, MRN [id number]  PCP:  Lucianne Lei, MD  Cardiologist:  Rex Kras, DO, [facility] (established care 05/22/2020) Former Cardiology Providers: Dr. Orie Rout [facility])  Date: 07/03/22 Last Office Visit: 07/02/2021  Chief Complaint  Patient presents with   Follow-up    1 year Carotid disease and valvular heart disease    HPI  Kenneth Daniel is a 77 y.o. male whose past medical history and cardiovascular risk factors include: Prostate Cancer (s/p surgery, radiation,  and hormone tx), Hypertension, Hyperlipidemia, asymptomatic right ICA stenosis, aortic regurgitation, Hx of COVID infection, advanced age, former smoker.  In the past referred to the practice for evaluation for decreased physical endurance and given his multiple cardiovascular risk factors for possible ischemic etiology.  Patient underwent an echocardiogram which noted preserved LVEF with mild to moderate aortic regurgitation.  And stress test was overall low risk.  His carotid duplex initially noted disease less than 50% stenosis and follow-up study in 2023 noted improvement in overall disease burden.  He now presents for 1 year follow-up visit.  Since last office visit he is doing well from a cardiovascular standpoint.  He denies anginal discomfort or heart failure symptoms.  His overall functional capacity remains relatively stable and he is still a very functional member of society.   FUNCTIONAL STATUS: Walks 1.5 miles 2-3 days a week  ALLERGIES: No Known Allergies  MEDICATION LIST PRIOR TO VISIT: Current Meds  Medication Sig   acetaminophen (TYLENOL) 500 MG tablet Take 1,000 mg by mouth every 8 (eight) hours as needed for moderate pain.   ALPRAZolam (XANAX) 0.25 MG tablet Take 0.25 mg by mouth daily as needed.   aluminum hydroxide-magnesium carbonate (GAVISCON) 95-358 MG/15ML SUSP Take 30 mLs by mouth as needed for indigestion or heartburn.   amLODipine (NORVASC) 10 MG tablet Take 10  mg by mouth daily.   ascorbic acid (VITAMIN C) 500 MG tablet Take 500 mg by mouth daily.   aspirin 81 MG EC tablet Take 81 mg by mouth at bedtime.   atorvastatin (LIPITOR) 40 MG tablet Take 40 mg by mouth at bedtime.   Azelastine HCl 137 MCG/SPRAY SOLN PLACE 2 SPRAYS INTO BOTH NOSTRILS 2 (TWO) TIMES DAILY   b complex vitamins tablet Take 1 tablet by mouth daily as needed.   cholecalciferol (VITAMIN D3) 25 MCG (1000 UNIT) tablet Take 1,000 Units by mouth daily.   Coenzyme Q10-levOCARNitine (CO Q-10 PLUS PO) Take 1 tablet by mouth daily at 12 noon.   desloratadine (CLARINEX) 5 MG tablet Take 1 tablet (5 mg total) by mouth 2 (two) times daily as needed.   diclofenac Sodium (VOLTAREN) 1 % GEL Apply 4 g topically 4 (four) times daily.   ECHINACEA EXTRACT PO Take 1 capsule by mouth daily as needed (cold symptoms).   EPINEPHrine 0.3 mg/0.3 mL IJ SOAJ injection Use as directed for life threatening allergic reactions   famotidine (PEPCID) 40 MG tablet Take 40 mg by mouth daily.   fluticasone (FLONASE) 50 MCG/ACT nasal spray Place 2 sprays into both nostrils daily.   hydrOXYzine (VISTARIL) 25 MG capsule Take 25 mg by mouth at bedtime as needed.   Magnesium 250 MG TABS Take 250 mg by mouth at bedtime.   Melatonin 5 MG CAPS Take 10 mg by mouth at bedtime.   nebivolol (BYSTOLIC) 10 MG tablet TAKE 1 TABLET BY MOUTH EVERY DAY IN THE MORNING   Nutritional Supplements (JUICE PLUS FIBRE PO) Take 2 capsules by mouth  daily.   Polyethylene Glycol 3350 (MIRALAX PO) Take by mouth daily.   Probiotic Product (PROBIOTIC DAILY PO) Take 1 capsule by mouth daily.   triamcinolone cream (KENALOG) 0.1 % Apply 1 application. topically 2 (two) times daily.   valsartan-hydrochlorothiazide (DIOVAN-HCT) 320-12.5 MG tablet Take 1 tablet by mouth daily.   zinc gluconate 50 MG tablet Take 50 mg by mouth daily as needed (supplement).     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, right    per last duplex in epic 06-04-2020  right ICA 16-49% and right ECA >50%   Chronic constipation    GERD (gastroesophageal reflux disease)    History of acute pyelonephritis 09/2020   w/ admission in epic due to severe sepsis   History of COVID-19 11/18/2020   positive result in epic;   per pt mild to moderate symptoms that resolved   History of external beam radiation therapy    completed in 2015 for recurrent prostate cancer (done in [facility])   History of prostate cancer    (current urologist-- dr Milford Cage, and current treatment lupron injeciton q 52month) ;   [place]  s/p [place] prostate[place];   recurrence 2015 completed radiation   HLD (hyperlipidemia)    Hypertension    followed by pcp and cardiology---   (05-29-2020  nuclear stress test in epic , low risk no ischemia nuclear ef 63%)   Inguinal hernia, right    Non-rheumatic aortic regurgitation    mild to moderate AR without stenosis per last echo in epic 02-27-2021   Perennial allergic rhinitis    followed by dr gollager ([facility])   Wears dentures    upper   Wears glasses    Wears hearing aid in both ears     PAST SURGICAL HISTORY: Past Surgical History:  Procedure Laterality Date   ANTERIOR CERVICAL DECOMP/DISCECTOMY FUSION N/A 11/09/2019   Procedure: ANTERIOR CERVICAL DECOMPRESSION FUSION CERVICAL 3-4 WITH INSTRUMENTATION AND ALLOGRAFT;  Surgeon: DPhylliss Bob MD;  [facility]  Service: Orthopedics;  Laterality: N/A;   [place]ARDI[place]   per pt stress test with possible ischemia,  told arteries normal   COLONOSCOPY  2020   CYST EXCISION  2005   per pt from back , was benign   INGUINAL HERNIA REPAIR Right 06/04/2021   Procedure: OPEN RIGHT INGUINAL HERNIA REPAIR;  Surgeon: CClovis Riley MD;  [facility]  Service: General;  Laterality: Right;   [place]   UPPER GI ENDOSCOPY  2021    FAMILY HISTORY: The patient family history includes Pancreatic cancer in his mother; Prostate cancer in his father.  SOCIAL HISTORY:  The patient  reports that he quit smoking about 51 years ago. His smoking use included cigarettes. He has never used smokeless tobacco. He reports that he does not drink alcohol and does not use drugs.  REVIEW OF SYSTEMS: Review of Systems  Cardiovascular:  Negative for chest pain, dyspnea on exertion, leg swelling, orthopnea, palpitations, paroxysmal nocturnal dyspnea and syncope.  Respiratory:  Negative for shortness of breath.     PHYSICAL EXAM:    07/03/2022   10:21 AM 04/09/2022    3:28 PM 04/06/2022    9:43 AM  Vitals with BMI  Height '6\' 2"'$   '6\' 2"'$   Weight 186 lbs  3 oz  189 lbs 6 oz  BMI [id number]  0000000  Systolic [id number]  Diastolic 68 68 62  Pulse 77 80 95   Physical Exam  Constitutional: No distress.  Age appropriate, hemodynamically stable.   HENT:  Hearing aids  Neck: No JVD present.  Cardiovascular: Normal rate, regular rhythm, S1 normal, S2 normal, intact distal pulses and normal pulses. Exam reveals no gallop, no S3 and no S4.  No murmur heard. Pulses:      Dorsalis pedis pulses are 2+ on the right side and 2+ on the left side.       Posterior tibial pulses are 2+ on the right side and 2+ on the left side.  Pulmonary/Chest: Effort normal and breath sounds normal. No stridor. He has no wheezes. He has no rales.  Abdominal: Soft. Bowel sounds are normal. He exhibits no distension. There is no abdominal tenderness.  Musculoskeletal:        General: No edema.     Cervical back: Neck supple.  Neurological: He is alert and oriented to person, place, and time. He has intact cranial nerves (2-12).  Skin: Skin is warm and moist.   CARDIAC DATABASE: EKG: 07/02/2021: NSR, 78 bpm, normal axis without underlying ischemia injury pattern.    Echocardiogram: 02/27/2021 1. Left ventricular ejection fraction, by  estimation, is 60 to 65%. The left ventricle has normal function. The left ventricle has no regional wall motion abnormalities. Left ventricular diastolic parameters were normal. 2. Right ventricular systolic function is normal. The right ventricular size is normal. There is normal pulmonary artery systolic pressure. 3. The mitral valve is normal in structure. Trivial mitral valve regurgitation. 4. The aortic valve is tricuspid. Aortic valve regurgitation is mild to moderate. No aortic stenosis is present. 5. Large simple cyst in the liver measuring 7.7x10.2 cm.   Comparison(s): Compared to prior study 06/04/2020: Moderate AR is now mild to moderate otherwise no significant change. Liver cyst is seen again on current study consider ultrasound of the liver/abdomen to further evaluate if clinical indicated.   Stress Testing: Lexiscan Tetrofosmin Stress Test 05/29/2020: Nondiagnostic ECG stress. Myocardial perfusion is normal. Overall LV systolic function is normal without regional wall motion abnormalities. Stress LV EF: 63%.  No previous exam available for comparison. Low risk.   Heart Catheterization: 18 years ago w/ Dr. Orie Rout back in [facility]. No records to review.   Carotid artery duplex 06/12/2021: No hemodynamically significant arterial disease in the internal carotid artery bilaterally. Minimal soft plaque noted in bilateral carotid arteries.  Compared to 06/04/2020, right ICA stenosis of 15-49% not seen. Antegrade right vertebral artery flow. Antegrade left vertebral artery flow.  LABORATORY DATA:    Latest Ref Rng & Units 07/03/2021    4:22 PM 06/04/2021    9:30 AM 11/18/2020    5:00 AM  CBC  WBC 4.0 - 10.5 K/uL 4.4   3.6   Hemoglobin 13.0 - 17.0 g/dL 12.0  14.6  11.3   Hematocrit 39.0 - 52.0 % 38.2  43.0  35.1   Platelets 150 - 400 K/uL 238   223        Latest Ref Rng & Units 07/03/2021    4:22 PM 06/04/2021    9:30 AM 11/18/2020    5:00 AM  CMP  Glucose 70 - 99 mg/dL 139  95   107   BUN 8 - 23 mg/dL '12  22  12   '$ Creatinine 0.61 - 1.24 mg/dL 1.09  1.10  1.10   Sodium 135 - 145 mmol/L 140  141  134   Potassium 3.5 - 5.1 mmol/L 3.9  4.0  3.6   Chloride 98 - 111 mmol/L 103  104  100   CO2 22 - 32 mmol/L 28   26   Calcium 8.9 - 10.3 mg/dL 9.4   9.1   Total Protein 6.5 - 8.1 g/dL 7.4   7.9   Total Bilirubin 0.3 - 1.2 mg/dL 0.5   0.7   Alkaline Phos 38 - 126 U/L 89   74   AST 15 - 41 U/L 19   26   ALT 0 - 44 U/L 19   28     Lipid Panel  No results found for: "CHOL", "TRIG", "HDL", "CHOLHDL", "VLDL", "LDLCALC", "LDLDIRECT", "LABVLDL"  No components found for: "NTPROBNP" No results for input(s): "PROBNP" in the last 8760 hours. No results for input(s): "TSH" in the last 8760 hours.  BMP Recent Labs    07/03/21 1622  NA 140  K 3.9  CL 103  CO2 28  GLUCOSE 139*  BUN 12  CREATININE 1.09  CALCIUM 9.4  GFRNONAA >60    HEMOGLOBIN A1C No results found for: "HGBA1C", "MPG"  External Labs: Collected: 10/25/2019 Creatinine 0.99 mg/dL. eGFR: >60 mL/min per 1.73 m Potassium 4.2 AST 24, ALT 38, alkaline phosphatase 59 Lipid profile: Total cholesterol 105, triglycerides 54, HDL 49, LDL 43, non-HDL 56 Hemoglobin A1c: 5.6 TSH: 0.73,Free T4 1.3, and Free T3 2.8 BNP: 53   IMPRESSION:    ICD-10-CM   1. Nonrheumatic aortic valve insufficiency  I35.1 PCV ECHOCARDIOGRAM COMPLETE    2. Benign hypertension  I10     3. Hypercholesteremia  E78.00     4. Former smoker  Z87.891     5. History of COVID-19  Z86.16        RECOMMENDATIONS: QUASON LAURITSEN is a 77 y.o. male whose past medical history and cardiac risk factors include: Prostate Cancer (s/p surgery, radiation,  and hormone tx), Hypertension, Hyperlipidemia, Hx of asymptomatic right ICA stenosis, aortic regurgitation, Hx of COVID infection, advanced age, former smoker.  Nonrheumatic aortic valve insufficiency Asymptomatic. Last study in October 2022 which noted mild to moderate aortic  regurgitation. Blood pressures are well-controlled. Will repeat surface echocardiogram prior to the next year's office visit in 2025.  Monitor for now.  Benign hypertension Office blood pressures are well-controlled. Continue current medical therapy. No changes warranted.  Hypercholesteremia Continue statin therapy. Currently managed by primary care provider. No recent labs able for review.  Patient states that he has an appointment to see his PCP later this month.  He will send [place] a copy of his labs for reference after they have been performed and resulted.  FINAL MEDICATION LIST END OF ENCOUNTER: No orders of the defined types were placed in this encounter.     Current Outpatient Medications:    acetaminophen (TYLENOL) 500 MG tablet, Take 1,000 mg by mouth every 8 (eight) hours as needed for moderate pain., Disp: , Rfl:    ALPRAZolam (XANAX) 0.25 MG tablet, Take 0.25 mg by mouth daily as needed., Disp: , Rfl:    aluminum hydroxide-magnesium carbonate (GAVISCON) 95-358 MG/15ML SUSP, Take 30 mLs by mouth as needed for indigestion or heartburn., Disp: , Rfl:    amLODipine (NORVASC) 10 MG tablet, Take 10 mg by mouth daily., Disp: , Rfl:    ascorbic acid (VITAMIN C) 500 MG tablet, Take 500 mg by mouth daily., Disp: , Rfl:  aspirin 81 MG EC tablet, Take 81 mg by mouth at bedtime., Disp: , Rfl:    atorvastatin (LIPITOR) 40 MG tablet, Take 40 mg by mouth at bedtime., Disp: , Rfl:    Azelastine HCl 137 MCG/SPRAY SOLN, PLACE 2 SPRAYS INTO BOTH NOSTRILS 2 (TWO) TIMES DAILY, Disp: 30 mL, Rfl: 5   b complex vitamins tablet, Take 1 tablet by mouth daily as needed., Disp: , Rfl:    cholecalciferol (VITAMIN D3) 25 MCG (1000 UNIT) tablet, Take 1,000 Units by mouth daily., Disp: , Rfl:    Coenzyme Q10-levOCARNitine (CO Q-10 PLUS PO), Take 1 tablet by mouth daily at 12 noon., Disp: , Rfl:    desloratadine (CLARINEX) 5 MG tablet, Take 1 tablet (5 mg total) by mouth 2 (two) times daily as needed.,  Disp: 180 tablet, Rfl: 0   diclofenac Sodium (VOLTAREN) 1 % GEL, Apply 4 g topically 4 (four) times daily., Disp: 100 g, Rfl: 0   ECHINACEA EXTRACT PO, Take 1 capsule by mouth daily as needed (cold symptoms)., Disp: , Rfl:    EPINEPHrine 0.3 mg/0.3 mL IJ SOAJ injection, Use as directed for life threatening allergic reactions, Disp: 2 each, Rfl: 3   famotidine (PEPCID) 40 MG tablet, Take 40 mg by mouth daily., Disp: , Rfl:    fluticasone (FLONASE) 50 MCG/ACT nasal spray, Place 2 sprays into both nostrils daily., Disp: 48 mL, Rfl: 5   hydrOXYzine (VISTARIL) 25 MG capsule, Take 25 mg by mouth at bedtime as needed., Disp: , Rfl:    Magnesium 250 MG TABS, Take 250 mg by mouth at bedtime., Disp: , Rfl:    Melatonin 5 MG CAPS, Take 10 mg by mouth at bedtime., Disp: , Rfl:    nebivolol (BYSTOLIC) 10 MG tablet, TAKE 1 TABLET BY MOUTH EVERY DAY IN THE MORNING, Disp: 90 tablet, Rfl: 0   Nutritional Supplements (JUICE PLUS FIBRE PO), Take 2 capsules by mouth daily., Disp: , Rfl:    Polyethylene Glycol 3350 (MIRALAX PO), Take by mouth daily., Disp: , Rfl:    Probiotic Product (PROBIOTIC DAILY PO), Take 1 capsule by mouth daily., Disp: , Rfl:    triamcinolone cream (KENALOG) 0.1 %, Apply 1 application. topically 2 (two) times daily., Disp: 454 g, Rfl: 1   valsartan-hydrochlorothiazide (DIOVAN-HCT) 320-12.5 MG tablet, Take 1 tablet by mouth daily., Disp: , Rfl:    zinc gluconate 50 MG tablet, Take 50 mg by mouth daily as needed (supplement)., Disp: , Rfl:   Orders Placed This Encounter  Procedures   PCV ECHOCARDIOGRAM COMPLETE    There are no Patient Instructions on file for this visit.   --Continue cardiac medications as reconciled in final medication list. --Return in about 1 year (around 07/03/2023) for Annual follow up visit AR. Or sooner if needed. --Continue follow-up with your primary care physician regarding the management of your other chronic comorbid conditions.  Patient's questions and  concerns were addressed to his satisfaction. He voices understanding of the instructions provided during this encounter.   This note was created using a voice recognition software as a result there may be grammatical errors inadvertently enclosed that do not reflect the nature of this encounter. Every attempt is made to correct such errors.  Rex Kras, [place], [facility]  Pager: [phone] Office: [phone]

## 2022-07-07 ENCOUNTER — Ambulatory Visit (INDEPENDENT_AMBULATORY_CARE_PROVIDER_SITE_OTHER): Payer: Medicare PPO

## 2022-07-07 ENCOUNTER — Ambulatory Visit: Payer: Medicare PPO | Admitting: Allergy & Immunology

## 2022-07-07 DIAGNOSIS — F064 Anxiety disorder due to known physiological condition: Secondary | ICD-10-CM | POA: Diagnosis not present

## 2022-07-07 DIAGNOSIS — M130 Polyarthritis, unspecified: Secondary | ICD-10-CM | POA: Diagnosis not present

## 2022-07-07 DIAGNOSIS — E559 Vitamin D deficiency, unspecified: Secondary | ICD-10-CM | POA: Diagnosis not present

## 2022-07-07 DIAGNOSIS — J309 Allergic rhinitis, unspecified: Secondary | ICD-10-CM | POA: Diagnosis not present

## 2022-07-07 DIAGNOSIS — E785 Hyperlipidemia, unspecified: Secondary | ICD-10-CM | POA: Diagnosis not present

## 2022-07-07 DIAGNOSIS — I10 Essential (primary) hypertension: Secondary | ICD-10-CM | POA: Diagnosis not present

## 2022-07-16 ENCOUNTER — Ambulatory Visit (INDEPENDENT_AMBULATORY_CARE_PROVIDER_SITE_OTHER): Payer: Medicare PPO

## 2022-07-16 DIAGNOSIS — J309 Allergic rhinitis, unspecified: Secondary | ICD-10-CM

## 2022-07-21 ENCOUNTER — Ambulatory Visit (INDEPENDENT_AMBULATORY_CARE_PROVIDER_SITE_OTHER): Payer: Medicare PPO

## 2022-07-21 DIAGNOSIS — J309 Allergic rhinitis, unspecified: Secondary | ICD-10-CM | POA: Diagnosis not present

## 2022-07-28 ENCOUNTER — Ambulatory Visit (INDEPENDENT_AMBULATORY_CARE_PROVIDER_SITE_OTHER): Payer: Medicare PPO

## 2022-07-28 DIAGNOSIS — J309 Allergic rhinitis, unspecified: Secondary | ICD-10-CM

## 2022-08-04 ENCOUNTER — Ambulatory Visit (INDEPENDENT_AMBULATORY_CARE_PROVIDER_SITE_OTHER): Payer: Medicare PPO

## 2022-08-04 DIAGNOSIS — J309 Allergic rhinitis, unspecified: Secondary | ICD-10-CM | POA: Diagnosis not present

## 2022-08-11 ENCOUNTER — Ambulatory Visit (INDEPENDENT_AMBULATORY_CARE_PROVIDER_SITE_OTHER): Payer: Medicare PPO

## 2022-08-11 DIAGNOSIS — J309 Allergic rhinitis, unspecified: Secondary | ICD-10-CM | POA: Diagnosis not present

## 2022-08-17 DIAGNOSIS — Z6823 Body mass index (BMI) 23.0-23.9, adult: Secondary | ICD-10-CM | POA: Diagnosis not present

## 2022-08-17 DIAGNOSIS — K219 Gastro-esophageal reflux disease without esophagitis: Secondary | ICD-10-CM | POA: Diagnosis not present

## 2022-08-17 DIAGNOSIS — E7800 Pure hypercholesterolemia, unspecified: Secondary | ICD-10-CM | POA: Diagnosis not present

## 2022-08-17 DIAGNOSIS — R7303 Prediabetes: Secondary | ICD-10-CM | POA: Diagnosis not present

## 2022-08-17 DIAGNOSIS — C61 Malignant neoplasm of prostate: Secondary | ICD-10-CM | POA: Diagnosis not present

## 2022-08-17 DIAGNOSIS — I5030 Unspecified diastolic (congestive) heart failure: Secondary | ICD-10-CM | POA: Diagnosis not present

## 2022-08-17 DIAGNOSIS — I110 Hypertensive heart disease with heart failure: Secondary | ICD-10-CM | POA: Diagnosis not present

## 2022-08-18 ENCOUNTER — Ambulatory Visit (INDEPENDENT_AMBULATORY_CARE_PROVIDER_SITE_OTHER): Payer: Medicare PPO

## 2022-08-18 DIAGNOSIS — J309 Allergic rhinitis, unspecified: Secondary | ICD-10-CM | POA: Diagnosis not present

## 2022-08-18 DIAGNOSIS — R9721 Rising PSA following treatment for malignant neoplasm of prostate: Secondary | ICD-10-CM | POA: Diagnosis not present

## 2022-08-24 DIAGNOSIS — C61 Malignant neoplasm of prostate: Secondary | ICD-10-CM | POA: Diagnosis not present

## 2022-08-25 ENCOUNTER — Ambulatory Visit (INDEPENDENT_AMBULATORY_CARE_PROVIDER_SITE_OTHER): Payer: Medicare PPO

## 2022-08-25 DIAGNOSIS — J309 Allergic rhinitis, unspecified: Secondary | ICD-10-CM

## 2022-09-01 ENCOUNTER — Other Ambulatory Visit: Payer: Self-pay | Admitting: Allergy & Immunology

## 2022-09-02 ENCOUNTER — Ambulatory Visit (INDEPENDENT_AMBULATORY_CARE_PROVIDER_SITE_OTHER): Payer: Medicare PPO | Admitting: *Deleted

## 2022-09-02 DIAGNOSIS — J309 Allergic rhinitis, unspecified: Secondary | ICD-10-CM

## 2022-09-04 ENCOUNTER — Other Ambulatory Visit (HOSPITAL_COMMUNITY): Payer: Self-pay | Admitting: Urology

## 2022-09-04 DIAGNOSIS — N281 Cyst of kidney, acquired: Secondary | ICD-10-CM

## 2022-09-05 DIAGNOSIS — G4733 Obstructive sleep apnea (adult) (pediatric): Secondary | ICD-10-CM | POA: Diagnosis not present

## 2022-09-09 ENCOUNTER — Ambulatory Visit (INDEPENDENT_AMBULATORY_CARE_PROVIDER_SITE_OTHER): Payer: Medicare PPO

## 2022-09-09 DIAGNOSIS — J309 Allergic rhinitis, unspecified: Secondary | ICD-10-CM

## 2022-09-15 ENCOUNTER — Ambulatory Visit (INDEPENDENT_AMBULATORY_CARE_PROVIDER_SITE_OTHER): Payer: Medicare PPO

## 2022-09-15 DIAGNOSIS — J309 Allergic rhinitis, unspecified: Secondary | ICD-10-CM | POA: Diagnosis not present

## 2022-09-17 IMAGING — CT CT HEAD W/O CM
3 series · 15 of 47 positions shown, 18 images · non-contrast
Comparison: None.

CLINICAL DATA: Headache.



[Series 3: head 5.0 h30s · axial · 0.41mm/px · z∈[+1395,+1525]mm · 9 of 32 slices shown, 12 images]
[im 3/32  brain]
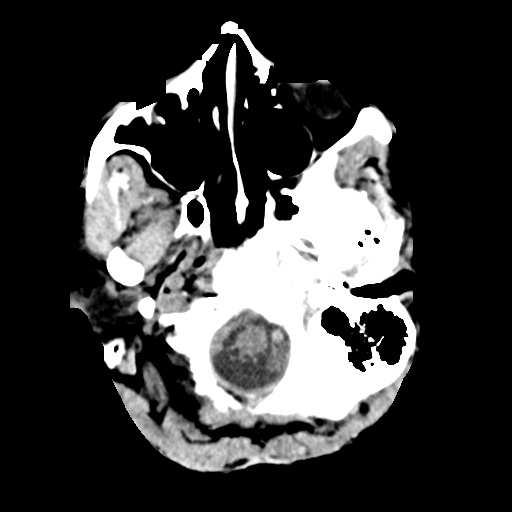
[im 3/32  bone]
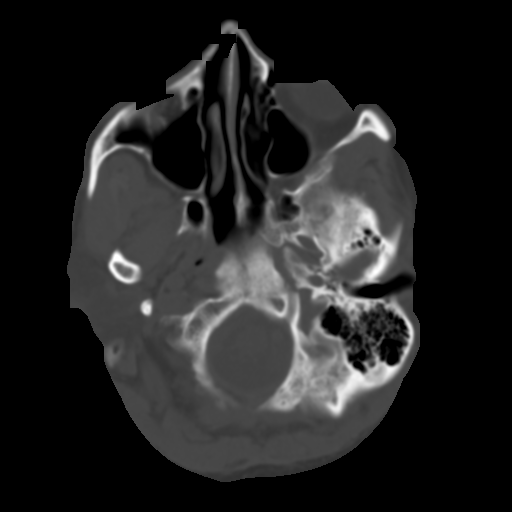
[im 6/32  brain]
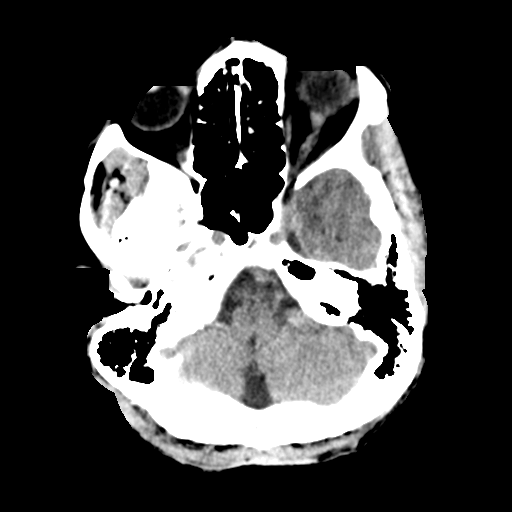
[im 9/32  brain]
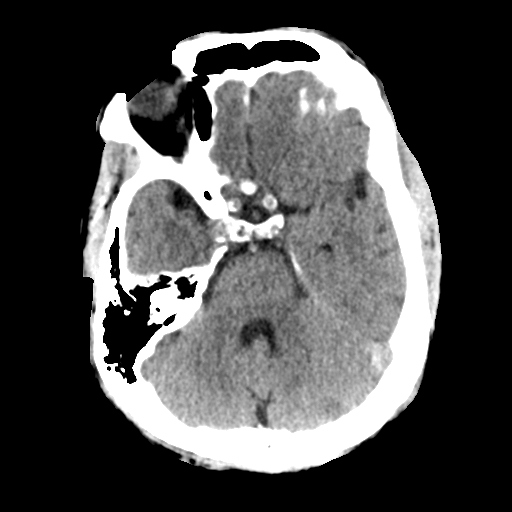
[im 12/32  brain]
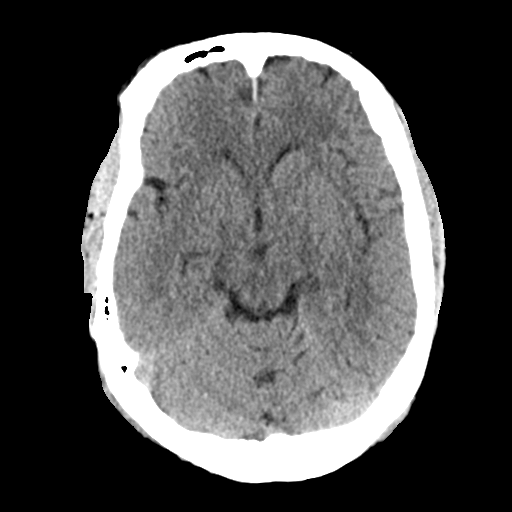
[im 17/32  brain]
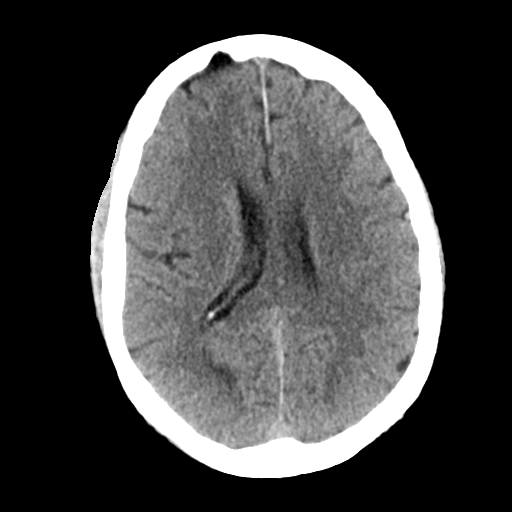
[im 17/32  bone]
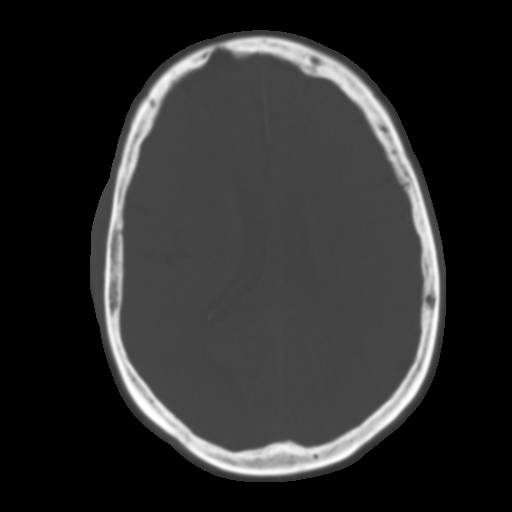
[im 20/32  brain]
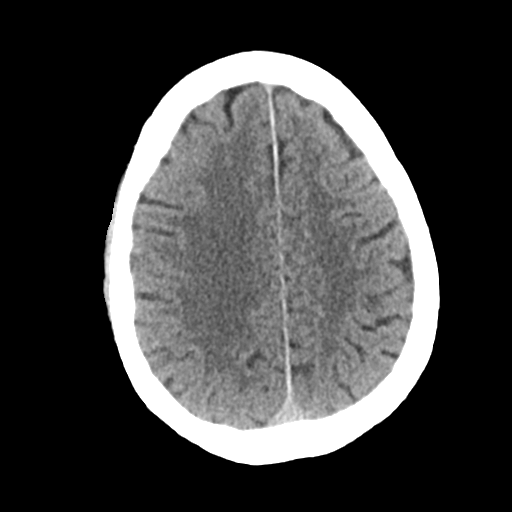
[im 23/32  brain]
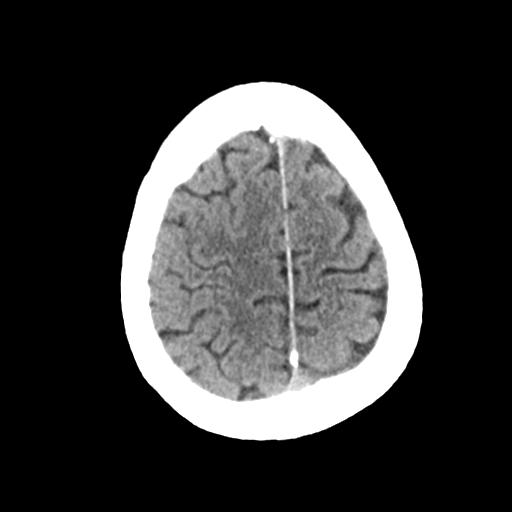
[im 26/32  brain]
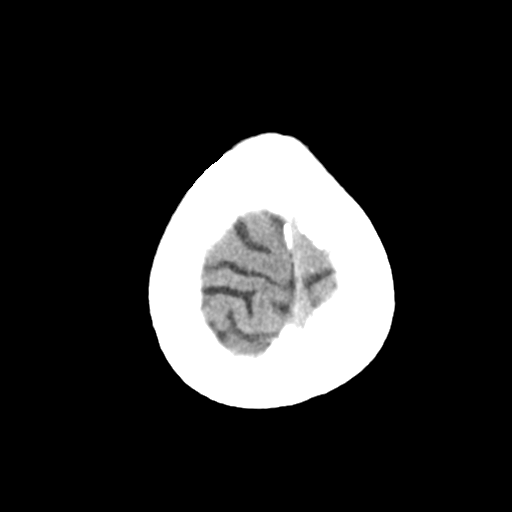
[im 29/32  brain]
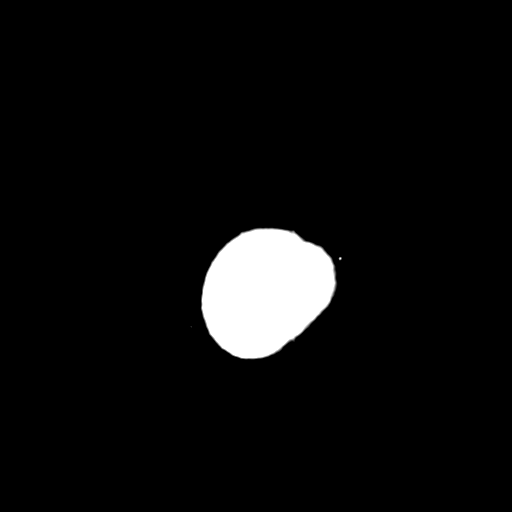
[im 29/32  bone]
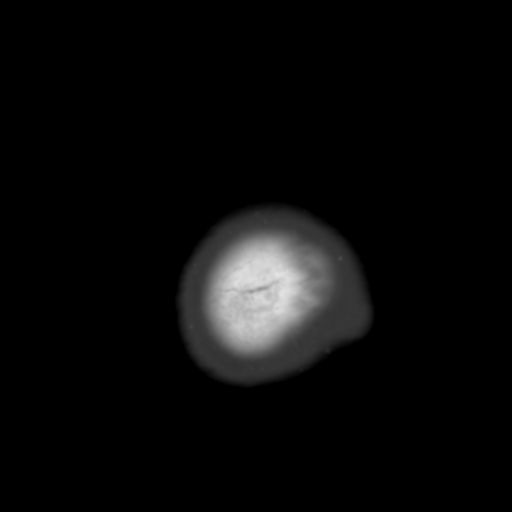

[Series 5: head 3.0 mpr cor · coronal · 0.33mm/px · 3 of 70 slices shown]
[im 24/70  brain]
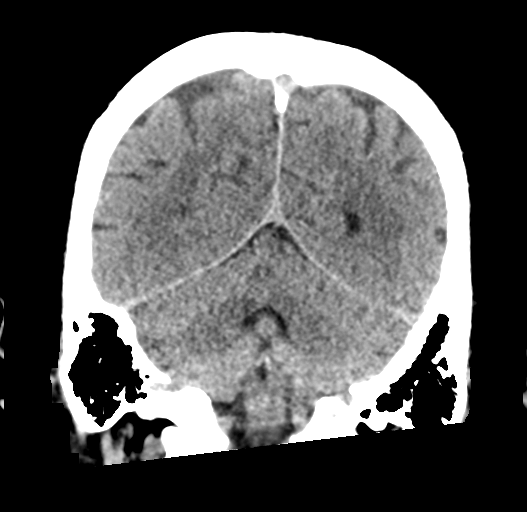
[im 31/70  brain]
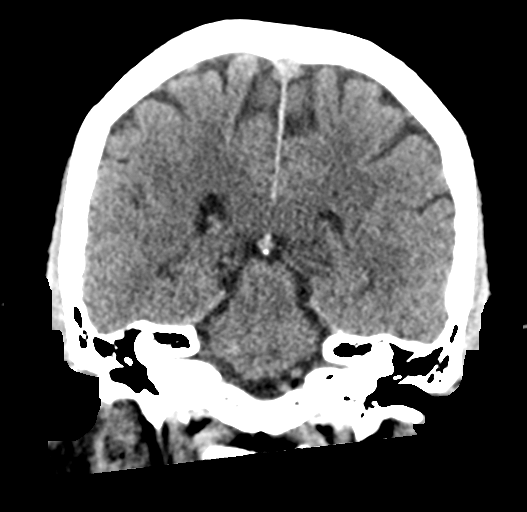
[im 39/70  brain]
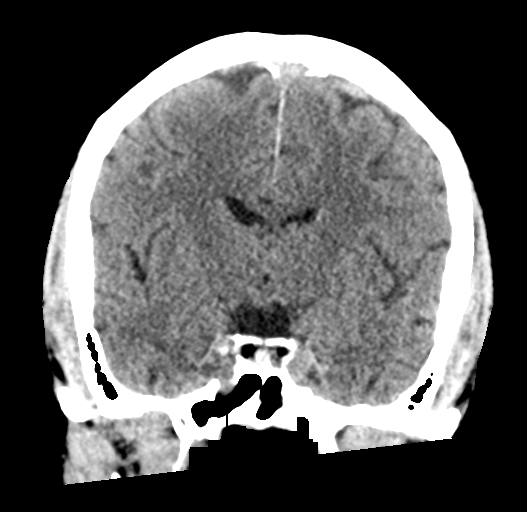

[Series 6: head 3.0 mpr sag · sagittal · 0.31mm/px · 3 of 63 slices shown]
[im 27/63  brain]
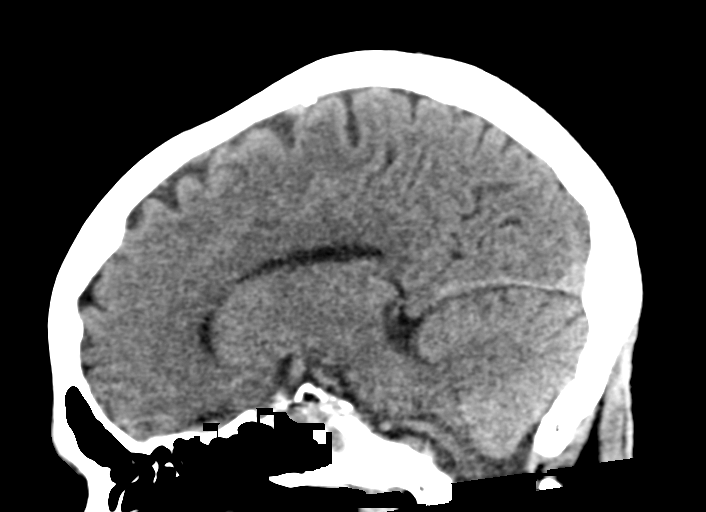
[im 32/63  brain]
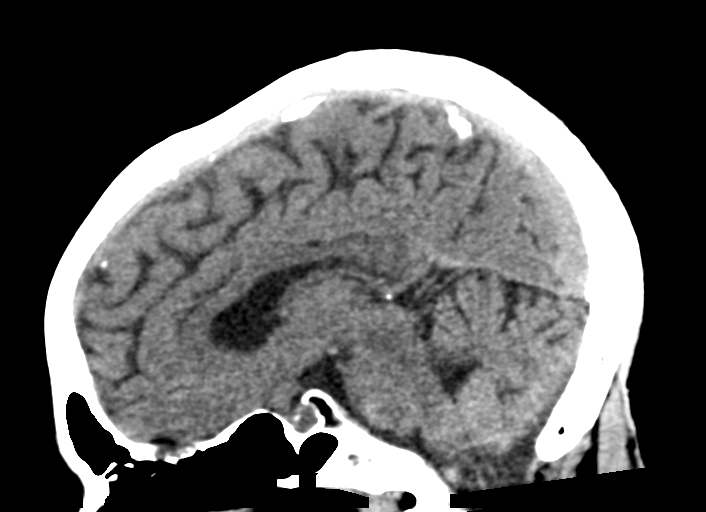
[im 36/63  brain]
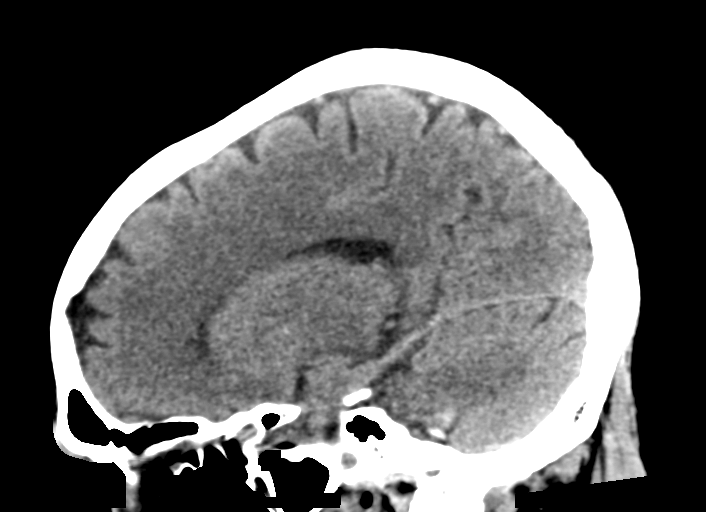

[15 of 47 positions shown; findings below may reference images not displayed]

FINDINGS: Brain: No evidence of acute infarction, hemorrhage, hydrocephalus,
extra-axial collection or mass lesion/mass effect.

Vascular: No hyperdense vessel or unexpected calcification.

Skull: Normal. Negative for fracture or focal lesion.

Sinuses/Orbits: No acute finding.

Other: None
IMPRESSION: 1. No acute intracranial abnormalities.
2. Chronic small vessel ischemic disease.

## 2022-09-22 ENCOUNTER — Ambulatory Visit (INDEPENDENT_AMBULATORY_CARE_PROVIDER_SITE_OTHER): Payer: Medicare PPO

## 2022-09-22 DIAGNOSIS — J309 Allergic rhinitis, unspecified: Secondary | ICD-10-CM

## 2022-09-24 DIAGNOSIS — C61 Malignant neoplasm of prostate: Secondary | ICD-10-CM | POA: Diagnosis not present

## 2022-10-05 ENCOUNTER — Ambulatory Visit (INDEPENDENT_AMBULATORY_CARE_PROVIDER_SITE_OTHER): Payer: Medicare PPO | Admitting: *Deleted

## 2022-10-05 DIAGNOSIS — J309 Allergic rhinitis, unspecified: Secondary | ICD-10-CM | POA: Diagnosis not present

## 2022-10-08 DIAGNOSIS — C61 Malignant neoplasm of prostate: Secondary | ICD-10-CM | POA: Diagnosis not present

## 2022-10-08 DIAGNOSIS — F4381 Prolonged grief disorder: Secondary | ICD-10-CM | POA: Diagnosis not present

## 2022-10-08 DIAGNOSIS — R609 Edema, unspecified: Secondary | ICD-10-CM | POA: Diagnosis not present

## 2022-10-08 DIAGNOSIS — R7303 Prediabetes: Secondary | ICD-10-CM | POA: Diagnosis not present

## 2022-10-08 DIAGNOSIS — F064 Anxiety disorder due to known physiological condition: Secondary | ICD-10-CM | POA: Diagnosis not present

## 2022-10-08 DIAGNOSIS — I10 Essential (primary) hypertension: Secondary | ICD-10-CM | POA: Diagnosis not present

## 2022-10-08 DIAGNOSIS — I358 Other nonrheumatic aortic valve disorders: Secondary | ICD-10-CM | POA: Diagnosis not present

## 2022-10-12 ENCOUNTER — Ambulatory Visit (INDEPENDENT_AMBULATORY_CARE_PROVIDER_SITE_OTHER): Payer: Medicare PPO | Admitting: *Deleted

## 2022-10-12 DIAGNOSIS — J309 Allergic rhinitis, unspecified: Secondary | ICD-10-CM | POA: Diagnosis not present

## 2022-10-13 ENCOUNTER — Ambulatory Visit: Payer: Medicare PPO | Admitting: Allergy & Immunology

## 2022-10-16 ENCOUNTER — Other Ambulatory Visit: Payer: Self-pay | Admitting: Allergy & Immunology

## 2022-10-20 DIAGNOSIS — E1169 Type 2 diabetes mellitus with other specified complication: Secondary | ICD-10-CM | POA: Diagnosis not present

## 2022-10-21 DIAGNOSIS — K30 Functional dyspepsia: Secondary | ICD-10-CM | POA: Diagnosis not present

## 2022-10-22 ENCOUNTER — Encounter (HOSPITAL_COMMUNITY): Payer: Self-pay | Admitting: Emergency Medicine

## 2022-10-22 ENCOUNTER — Ambulatory Visit (INDEPENDENT_AMBULATORY_CARE_PROVIDER_SITE_OTHER): Payer: Medicare PPO

## 2022-10-22 ENCOUNTER — Other Ambulatory Visit: Payer: Self-pay

## 2022-10-22 ENCOUNTER — Ambulatory Visit (HOSPITAL_COMMUNITY)
Admission: EM | Admit: 2022-10-22 | Discharge: 2022-10-22 | Disposition: A | Discharge status: Home / Self Care | Payer: Medicare PPO | Attending: Family Medicine | Admitting: Family Medicine

## 2022-10-22 ENCOUNTER — Other Ambulatory Visit: Payer: Self-pay | Admitting: Cardiology

## 2022-10-22 ENCOUNTER — Telehealth: Payer: Self-pay

## 2022-10-22 DIAGNOSIS — J309 Allergic rhinitis, unspecified: Secondary | ICD-10-CM

## 2022-10-22 DIAGNOSIS — R5383 Other fatigue: Secondary | ICD-10-CM

## 2022-10-22 DIAGNOSIS — Z20822 Contact with and (suspected) exposure to covid-19: Secondary | ICD-10-CM | POA: Diagnosis not present

## 2022-10-22 DIAGNOSIS — F411 Generalized anxiety disorder: Secondary | ICD-10-CM | POA: Diagnosis not present

## 2022-10-22 DIAGNOSIS — I10 Essential (primary) hypertension: Secondary | ICD-10-CM

## 2022-10-22 MED ORDER — DESLORATADINE 5 MG PO TABS: 5.0000 mg | ORAL_TABLET | Freq: Two times a day (BID) | ORAL | 0 refills | Status: DC | PRN

## 2022-10-22 NOTE — Discharge Instructions (Signed)
You have been tested for COVID-19 today. °If your test returns positive, you will receive a phone call from [place] regarding your results. °Negative test results are not called. °Both positive and negative results area always visible on MyChart. °If you do not have a MyChart account, sign up instructions are provided in your discharge papers. °Please do not hesitate to contact us should you have questions or concerns. ° °

## 2022-10-22 NOTE — Telephone Encounter (Signed)
Patient called in - DOB/Pharmacy verified - requested medication refill on Desloratadine (Clarinex) 5 mg  sent to [facility].  Patient advised last office visit: 04/09/22  - 6 mth f/u has been scheduled for: 11/10/22 @ 5 pm w/Dr. Dellis Anes.  Patient advised a courtesy refill be sent in - he will need to keep his appt for future refills.  Patient verbalized understanding, no further questions.

## 2022-10-22 NOTE — ED Provider Notes (Signed)
[facility]   [id number] 10/22/22 Arrival Time: 1859  ASSESSMENT & PLAN:  1. Exposure to COVID-19 virus    COVID testing sent upon pt request. OTC symptom care as needed.   Follow-up Information     Renaye Rakers, MD.   Specialty: Family Medicine Why: As needed. Contact information: [address] [place] [address] [phone]                 Reviewed expectations re: course of current medical issues. Questions answered. Outlined signs and symptoms indicating need for more acute intervention. Understanding verbalized. After Visit Summary given.   SUBJECTIVE: History from: Patient. Kenneth Daniel is a 77 y.o. male. Sinus drainage for 2 days.  Saw pcp yesterday.  Was told this is a cold.  Saw allergist today, told to take allergy spray and should be ok.    Patient reports he recently went to a reunion in [place].  Patient flew to [place].  Drove to [place].  Patient also rode a bus for part of this trip.    6/11 left [place], returned to [place] on 6/16  Reports the reunion coordinators sent word out that there had been confirmed covid illness at reunion.    Patient requesting covid testing   OBJECTIVE:  Vitals:   10/22/22 1925 10/22/22 1928  BP: (!) 151/92 (!) 149/84  Pulse: 92   Resp: 20   Temp: 99.6 F (37.6 C)   TempSrc: Oral   SpO2: 94%     General appearance: alert; no distress Eyes: PERRLA; EOMI; conjunctiva normal HENT: [place]; AT; with mild nasal congestion Neck: supple  Lungs: speaks full sentences without difficulty; unlabored Extremities: no edema Skin: warm and dry Neurologic: normal gait Psychological: alert and cooperative; normal mood and affect  Labs: Results for orders placed or performed during the hospital encounter of 09/20/21  POC Urinalysis dipstick  Result Value Ref Range   Glucose, UA NEGATIVE NEGATIVE mg/dL   Bilirubin Urine NEGATIVE NEGATIVE   Ketones, ur NEGATIVE NEGATIVE mg/dL    Specific Gravity, Urine 1.010 1.005 - 1.030   Hgb urine dipstick NEGATIVE NEGATIVE   pH 6.0 5.0 - 8.0   Protein, ur NEGATIVE NEGATIVE mg/dL   Urobilinogen, UA 0.2 0.0 - 1.0 mg/dL   Nitrite NEGATIVE NEGATIVE   Leukocytes,Ua NEGATIVE NEGATIVE   Labs Reviewed  SARS CORONAVIRUS 2 (TAT 6-24 HRS)    Imaging: No results found.  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, right    per last duplex in epic 06-04-2020  right ICA 16-49% and right ECA >50%   Chronic constipation    GERD (gastroesophageal reflux disease)    History of acute pyelonephritis 09/2020   w/ admission in epic due to severe sepsis   History of COVID-19 11/18/2020   positive result in epic;   per pt mild to moderate symptoms that resolved   History of external beam radiation therapy    completed in 2015 for recurrent prostate cancer (done in [facility])   History of prostate cancer    (current urologist-- dr Benancio Deeds, and current treatment lupron injeciton q 6months) ;   per pt in [place], [place] 2004  s/p radical prostatectomy;   recurrence 2015 completed radiation   HLD (hyperlipidemia)    Hypertension    followed by pcp and cardiology---   (05-29-2020  nuclear stress test in epic , low risk no ischemia  nuclear ef 63%)   Inguinal hernia, right    Non-rheumatic aortic regurgitation    mild to moderate AR without stenosis per last echo in epic 02-27-2021   Perennial allergic rhinitis    followed by dr gollager ([facility])   Wears dentures    upper   Wears glasses    Wears hearing aid in both ears    Social History   Socioeconomic History   Marital status: Single    Spouse name: Not on file   Number of children: Not on file   Years of education: Not on file   Highest education level: Not on file  Occupational History   Not on file  Tobacco Use   Smoking status: Former    Years: 4    Types: Cigarettes    Quit date:  58    Years since quitting: 51.5   Smokeless tobacco: Never  Vaping Use   Vaping Use: Never used  Substance and Sexual Activity   Alcohol use: Never   Drug use: Never   Sexual activity: Not Currently  Other Topics Concern   Not on file  Social History Narrative   Not on file   Social Determinants of Health   Financial Resource Strain: Not on file  Food Insecurity: Not on file  Transportation Needs: Not on file  Physical Activity: Not on file  Stress: Not on file  Social Connections: Not on file  Intimate Partner Violence: Not on file   Family History  Problem Relation Age of Onset   Pancreatic cancer Mother    Prostate cancer Father    Past Surgical History:  Procedure Laterality Date   ANTERIOR CERVICAL DECOMP/DISCECTOMY FUSION N/A 11/09/2019   Procedure: ANTERIOR CERVICAL DECOMPRESSION FUSION CERVICAL 3-4 WITH INSTRUMENTATION AND ALLOGRAFT;  Surgeon: Estill Bamberg, MD;  Location: [facility];  Service: Orthopedics;  Laterality: N/A;   CARDIAC CATHETERIZATION  2002   in [place], [place];   per pt stress test with possible ischemia,  told arteries normal   COLONOSCOPY  2020   CYST EXCISION  2005   per pt from back , was benign   INGUINAL HERNIA REPAIR Right 06/04/2021   Procedure: OPEN RIGHT INGUINAL HERNIA REPAIR;  Surgeon: Berna Bue, MD;  Location: [facility];  Service: General;  Laterality: Right;   PROSTATECTOMY  2004   in [place], [place]   UPPER GI ENDOSCOPY  2021     Mardella Layman, MD 10/22/22 2015

## 2022-10-22 NOTE — ED Triage Notes (Addendum)
Sinus drainage for 2 days.  Saw pcp yesterday.  Was told this is a cold.  Saw allergist today, told to take allergy spray and should be ok.    Patient reports he recently went to a reunion in [place].  Patient flew to [place].  Drove to [place].  Patient also rode a bus for part of this trip.    6/11 left [place], returned to [place] on 6/16  Reports the reunion coordinators sent word out that there had been confirmed covid illness at reunion.    Patient requesting covid testing

## 2022-10-23 LAB — SARS CORONAVIRUS 2 (TAT 6-24 HRS): SARS Coronavirus 2: POSITIVE — AB

## 2022-10-28 DIAGNOSIS — K7689 Other specified diseases of liver: Secondary | ICD-10-CM | POA: Diagnosis not present

## 2022-10-28 DIAGNOSIS — R1033 Periumbilical pain: Secondary | ICD-10-CM | POA: Diagnosis not present

## 2022-10-28 DIAGNOSIS — R112 Nausea with vomiting, unspecified: Secondary | ICD-10-CM | POA: Diagnosis not present

## 2022-11-04 ENCOUNTER — Ambulatory Visit (INDEPENDENT_AMBULATORY_CARE_PROVIDER_SITE_OTHER): Payer: Medicare PPO | Admitting: *Deleted

## 2022-11-04 DIAGNOSIS — J309 Allergic rhinitis, unspecified: Secondary | ICD-10-CM

## 2022-11-09 ENCOUNTER — Ambulatory Visit (HOSPITAL_COMMUNITY)
Admission: EM | Admit: 2022-11-09 | Discharge: 2022-11-09 | Disposition: A | Discharge status: Home / Self Care | Payer: Medicare PPO | Attending: Internal Medicine | Admitting: Internal Medicine

## 2022-11-09 ENCOUNTER — Encounter (HOSPITAL_COMMUNITY): Payer: Self-pay | Admitting: Internal Medicine

## 2022-11-09 DIAGNOSIS — K219 Gastro-esophageal reflux disease without esophagitis: Secondary | ICD-10-CM

## 2022-11-09 DIAGNOSIS — R110 Nausea: Secondary | ICD-10-CM

## 2022-11-09 MED ORDER — DIGESTIVE ENZYMES PO CAPS: ORAL_CAPSULE | ORAL | 0 refills | Status: DC

## 2022-11-09 MED ORDER — ESOMEPRAZOLE MAGNESIUM 40 MG PO CPDR: 40.0000 mg | DELAYED_RELEASE_CAPSULE | Freq: Every day | ORAL | 0 refills | Status: DC

## 2022-11-09 NOTE — ED Triage Notes (Signed)
Patient here today with c/o upset stomach X 1 week after coming back from [place] and off his normal diet. He has tried taking Tums and Omeprazole with no relief. Gaviscon usually helps. Doing a bland diet with some relief. He has a h/o acid reflux X 25 years.

## 2022-11-09 NOTE — ED Provider Notes (Signed)
[facility]    CSN: [id number] Arrival date & time: 11/09/22  1634      History   Chief Complaint Chief Complaint  Patient presents with   Gastroesophageal Reflux    HPI Kenneth Daniel is a 77 y.o. male who presents with having nausea x 1 week. States he was in [place] and got off his usual diet. Has hx of GERD. Has tried TUMs, Omeprazole, and Gaviscon and has not helped. He feels food comes up his throat. He denies any abdominal pain, constipation, vomiting or diarrhea. Has not had an endoscopy in 3 years when he lived in [place]. Has been on Pepsid 40 mg at bedtime for years and been doing well with this medication. He could not get in to see his PCP til 07/18 and thought he best come here today. Has had some intermittent bloating, but feels fine today, and thinks is because he only ate this am, and nothing since.     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, right    per last duplex in epic 06-04-2020  right ICA 16-49% and right ECA >50%   Chronic constipation    GERD (gastroesophageal reflux disease)    History of acute pyelonephritis 09/2020   w/ admission in epic due to severe sepsis   History of COVID-19 11/18/2020   positive result in epic;   per pt mild to moderate symptoms that resolved   History of external beam radiation therapy    completed in 2015 for recurrent prostate cancer (done in [facility])   History of prostate cancer    (current urologist-- dr Benancio Deeds, and current treatment lupron injeciton q 6months) ;   per pt in [place], [place] 2004  s/p radical prostatectomy;   recurrence 2015 completed radiation   HLD (hyperlipidemia)    Hypertension    followed by pcp and cardiology---   (05-29-2020  nuclear stress test in epic , low risk no ischemia nuclear ef 63%)   Inguinal hernia, right    Non-rheumatic aortic regurgitation    mild to moderate AR without stenosis per last echo in epic 02-27-2021    Perennial allergic rhinitis    followed by dr gollager ([facility])   Wears dentures    upper   Wears glasses    Wears hearing aid in both ears     Patient Active Problem List   Diagnosis Date Noted   Nonrheumatic aortic valve insufficiency    Perennial allergic rhinitis 12/12/2020   Pruritus 12/12/2020   Seasonal allergic conjunctivitis 12/12/2020   Acute pyelonephritis 09/22/2020   Pyelonephritis 09/22/2020   Myelopathy (HCC) 11/09/2019    Past Surgical History:  Procedure Laterality Date   ANTERIOR CERVICAL DECOMP/DISCECTOMY FUSION N/A 11/09/2019   Procedure: ANTERIOR CERVICAL DECOMPRESSION FUSION CERVICAL 3-4 WITH INSTRUMENTATION AND ALLOGRAFT;  Surgeon: Estill Bamberg, MD;  Location: [facility];  Service: Orthopedics;  Laterality: N/A;   CARDIAC CATHETERIZATION  2002   in [place], [place];   per pt stress test with possible ischemia,  told arteries normal   COLONOSCOPY  2020   CYST EXCISION  2005   per pt from back , was benign   INGUINAL HERNIA REPAIR Right 06/04/2021   Procedure: OPEN RIGHT INGUINAL HERNIA REPAIR;  Surgeon: Berna Bue, MD;  Location: [facility] ;  Service: General;  Laterality: Right;   PROSTATECTOMY  2004   in [place], [place]   UPPER GI  ENDOSCOPY  2021       Home Medications    Prior to Admission medications   Medication Sig Start Date End Date Taking? Authorizing Provider  Digestive Enzymes CAPS Take one 20 minutes before meals 11/09/22  Yes Rodriguez-Southworth, Nettie Elm, PA-C  esomeprazole (NEXIUM) 40 MG capsule Take 1 capsule (40 mg total) by mouth daily. 11/09/22  Yes Rodriguez-Southworth, Nettie Elm, PA-C  acetaminophen (TYLENOL) 500 MG tablet Take 1,000 mg by mouth every 8 (eight) hours as needed for moderate pain.    [provider]  ALPRAZolam Prudy Feeler) 0.25 MG tablet Take 0.25 mg by mouth daily as needed. 12/26/21   [provider]  aluminum hydroxide-magnesium carbonate (GAVISCON) 95-358 MG/15ML SUSP Take 30 mLs by  mouth as needed for indigestion or heartburn.    [provider]  amLODipine (NORVASC) 10 MG tablet Take 10 mg by mouth daily.    [provider]  ascorbic acid (VITAMIN C) 500 MG tablet Take 500 mg by mouth daily.    [provider]  aspirin 81 MG EC tablet Take 81 mg by mouth at bedtime.    [provider]  atorvastatin (LIPITOR) 40 MG tablet Take 40 mg by mouth at bedtime.    [provider]  Azelastine HCl 137 MCG/SPRAY SOLN PLACE 2 SPRAYS INTO BOTH NOSTRILS 2 (TWO) TIMES DAILY 09/01/22   Alfonse Spruce, MD  b complex vitamins tablet Take 1 tablet by mouth daily as needed.    [provider]  cholecalciferol (VITAMIN D3) 25 MCG (1000 UNIT) tablet Take 1,000 Units by mouth daily.    [provider]  Coenzyme Q10-levOCARNitine (CO Q-10 PLUS PO) Take 1 tablet by mouth daily at 12 noon.    [provider]  desloratadine (CLARINEX) 5 MG tablet Take 1 tablet (5 mg total) by mouth 2 (two) times daily as needed. 10/22/22   Alfonse Spruce, MD  diclofenac Sodium (VOLTAREN) 1 % GEL Apply 4 g topically 4 (four) times daily. 07/03/21   Melene Plan, DO  ECHINACEA EXTRACT PO Take 1 capsule by mouth daily as needed (cold symptoms).    [provider]  EPINEPHrine 0.3 mg/0.3 mL IJ SOAJ injection Use as directed for life threatening allergic reactions 05/01/22   Birder Robson, MD  famotidine (PEPCID) 40 MG tablet Take 40 mg by mouth daily.    [provider]  fluticasone (FLONASE) 50 MCG/ACT nasal spray Place 2 sprays into both nostrils daily. 04/09/22   Alfonse Spruce, MD  hydrOXYzine (VISTARIL) 25 MG capsule Take 25 mg by mouth at bedtime as needed. 11/25/21   [provider]  Magnesium 250 MG TABS Take 250 mg by mouth at bedtime.    [provider]  Melatonin 5 MG CAPS Take 10 mg by mouth at bedtime.    [provider]  nebivolol (BYSTOLIC) 10 MG tablet TAKE 1 TABLET BY MOUTH  EVERY DAY IN THE MORNING 10/22/22   Tolia, Sunit, DO  Nutritional Supplements (JUICE PLUS FIBRE PO) Take 2 capsules by mouth daily.    [provider]  Polyethylene Glycol 3350 (MIRALAX PO) Take by mouth daily.    [provider]  Probiotic Product (PROBIOTIC DAILY PO) Take 1 capsule by mouth daily.    [provider]  triamcinolone cream (KENALOG) 0.1 % Apply 1 application. topically 2 (two) times daily. 09/02/21   Alfonse Spruce, MD  valsartan-hydrochlorothiazide (DIOVAN-HCT) 320-12.5 MG tablet Take 1 tablet by mouth daily. 02/21/20   [provider]  XTANDI 40 MG tablet Take 80 mg by mouth 2 (two) times daily. 10/19/22   [provider]  zinc gluconate 50 MG tablet Take 50 mg by mouth daily as needed (supplement).    [provider]    Family History Family History  Problem Relation Age of Onset   Pancreatic cancer Mother    Prostate cancer Father     Social History Social History   Tobacco Use   Smoking status: Former    Years: 4    Types: Cigarettes    Quit date: 1973    Years since quitting: 51.5   Smokeless tobacco: Never  Vaping Use   Vaping Use: Never used  Substance Use Topics   Alcohol use: Never   Drug use: Never     Allergies   Patient has no known allergies.   Review of Systems Review of Systems  Constitutional:  Positive for appetite change. Negative for activity change and fever.  Gastrointestinal:  Positive for abdominal distention and abdominal pain. Negative for constipation, diarrhea, nausea and vomiting.     Physical Exam Triage Vital Signs ED Triage Vitals  Enc Vitals Group     BP 11/09/22 1811 106/81     Pulse Rate 11/09/22 1811 73     Resp 11/09/22 1811 18     Temp 11/09/22 1811 98.1 F (36.7 C)     Temp Source 11/09/22 1811 Oral     SpO2 11/09/22 1811 94 %     Weight 11/09/22 1810 180 lb (81.6 kg)     Height 11/09/22 1810 6\' 2"  (1.88 m)     Head Circumference --      Peak Flow  --      Pain Score 11/09/22 1809 8     Pain Loc --      Pain Edu? --      Excl. in GC? --    No data found.  Updated Vital Signs BP 106/81 (BP Location: Left Arm)   Pulse 73   Temp 98.1 F (36.7 C) (Oral)   Resp 18   Ht 6\' 2"  (1.88 m)   Wt 180 lb (81.6 kg)   SpO2 94%   BMI 23.11 kg/m   Visual Acuity Right Eye Distance:   Left Eye Distance:   Bilateral Distance:    Right Eye Near:   Left Eye Near:    Bilateral Near:     Physical Exam Vitals and nursing note reviewed.  Constitutional:      General: He is not in acute distress.    Appearance: He is normal weight. He is not toxic-appearing.  HENT:     Right Ear: External ear normal.     Left Ear: External ear normal.  Eyes:     General: No scleral icterus.    Conjunctiva/sclera: Conjunctivae normal.  Pulmonary:     Effort: Pulmonary effort is normal.  Abdominal:     General: Abdomen is flat. Bowel sounds are normal. There is no distension.     Palpations: Abdomen is soft. There is no mass.     Tenderness: There is no abdominal tenderness. There is no guarding or rebound.  Musculoskeletal:        General: Normal range of motion.  Skin:    General: Skin is warm and dry.  Neurological:     Mental Status: He is alert and oriented to person, place, and time.     Gait: Gait normal.  Psychiatric:  Mood and Affect: Mood normal.        Behavior: Behavior normal.        Thought Content: Thought content normal.        Judgment: Judgment normal.      UC Treatments / Results  Labs (all labs ordered are listed, but only abnormal results are displayed) Labs Reviewed - No data to display  EKG   Radiology No results found.  Procedures Procedures (including critical care time)  Medications Ordered in UC Medications - No data to display  Initial Impression / Assessment and Plan / UC Course  I have reviewed the triage vital signs and the nursing notes.  GERD   I advised him to take digestive enzymes  before meals, d.c the pepsid and take Nexium for 2 weeks.  Final Clinical Impressions(s) / UC Diagnoses   Final diagnoses:  Gastroesophageal reflux disease without esophagitis  Nausea without vomiting     Discharge Instructions      Call your primary care doctor tomorrow morning and make a follow up appointment for next week  Stop the Famontidine while on the Nexium   Take a digestive enzyme 20 minutes before a big meal to help you digest your food better, and you may have papaya and pineapple  with your meals as well and this will help your digestion as well.      ED Prescriptions     Medication Sig Dispense Auth. Provider   esomeprazole (NEXIUM) 40 MG capsule Take 1 capsule (40 mg total) by mouth daily. 14 capsule Rodriguez-Southworth, Nettie Elm, PA-C   Digestive Enzymes CAPS Take one 20 minutes before meals 90 capsule Rodriguez-Southworth, Nettie Elm, PA-C      PDMP not reviewed this encounter.   Garey Ham, [place] 11/09/22 1610

## 2022-11-09 NOTE — Discharge Instructions (Signed)
Call your primary care doctor tomorrow morning and make a follow up appointment for next week  Stop the Famontidine while on the Nexium   Take a digestive enzyme 20 minutes before a big meal to help you digest your food better, and you may have papaya and pineapple  with your meals as well and this will help your digestion as well.

## 2022-11-10 ENCOUNTER — Other Ambulatory Visit: Payer: Self-pay

## 2022-11-10 ENCOUNTER — Encounter: Payer: Self-pay | Admitting: Internal Medicine

## 2022-11-10 ENCOUNTER — Ambulatory Visit: Payer: Medicare PPO | Admitting: Internal Medicine

## 2022-11-10 VITALS — BP 138/82 | HR 80 | Temp 98.6°F | Resp 18 | Ht 74.0 in | Wt 176.8 lb

## 2022-11-10 DIAGNOSIS — I10 Essential (primary) hypertension: Secondary | ICD-10-CM | POA: Diagnosis not present

## 2022-11-10 DIAGNOSIS — F064 Anxiety disorder due to known physiological condition: Secondary | ICD-10-CM | POA: Diagnosis not present

## 2022-11-10 DIAGNOSIS — J309 Allergic rhinitis, unspecified: Secondary | ICD-10-CM

## 2022-11-10 DIAGNOSIS — R9721 Rising PSA following treatment for malignant neoplasm of prostate: Secondary | ICD-10-CM | POA: Diagnosis not present

## 2022-11-10 DIAGNOSIS — E785 Hyperlipidemia, unspecified: Secondary | ICD-10-CM | POA: Diagnosis not present

## 2022-11-10 DIAGNOSIS — L299 Pruritus, unspecified: Secondary | ICD-10-CM

## 2022-11-10 DIAGNOSIS — K30 Functional dyspepsia: Secondary | ICD-10-CM | POA: Diagnosis not present

## 2022-11-10 DIAGNOSIS — J3089 Other allergic rhinitis: Secondary | ICD-10-CM

## 2022-11-10 DIAGNOSIS — Z8616 Personal history of COVID-19: Secondary | ICD-10-CM | POA: Diagnosis not present

## 2022-11-10 DIAGNOSIS — R109 Unspecified abdominal pain: Secondary | ICD-10-CM | POA: Diagnosis not present

## 2022-11-10 DIAGNOSIS — N2889 Other specified disorders of kidney and ureter: Secondary | ICD-10-CM | POA: Diagnosis not present

## 2022-11-10 DIAGNOSIS — J302 Other seasonal allergic rhinitis: Secondary | ICD-10-CM

## 2022-11-10 MED ORDER — AZELASTINE HCL 137 MCG/SPRAY NA SOLN: 2.0000 | Freq: Two times a day (BID) | NASAL | 5 refills | Status: DC | PRN

## 2022-11-10 MED ORDER — EPINEPHRINE 0.3 MG/0.3ML IJ SOAJ: INTRAMUSCULAR | 3 refills | Status: DC

## 2022-11-10 MED ORDER — FLUTICASONE PROPIONATE 50 MCG/ACT NA SUSP: 2.0000 | Freq: Every day | NASAL | 5 refills | Status: DC

## 2022-11-10 NOTE — Patient Instructions (Addendum)
Allergic rhinitis (grasses, indoor molds, dust mites) - Continue fluticasone 1 spray in each nostril twice a day as needed for stuffy nose.   - Continue azelastine 2 sprays in each nostril twice a day as needed for runny nose or itch. - Continue Clarinex 5 mg 1-2 times daily. - Consider saline nasal rinses as needed for nasal symptoms.  - Continue allergy injections and bring your uptodate epinephrine auoinjector with you to all appointments.   2. Pruritus - Continue a twice a day moisturizing routine - Continue Clarinex once a day as needed for itch (as listed above). - Continue with triamcinolone cream to use as needed (avoid the face).  3. Return in about 1 year (around 11/10/2023).    Please inform us of any Emergency Department visits, hospitalizations, or changes in symptoms. Call us before going to the ED for breathing or allergy symptoms since we might be able to fit you in for a sick visit. Feel free to contact us anytime with any questions, problems, or concerns.  It was a pleasure to see you again today!  Websites that have reliable patient information: 1. American Academy of Asthma, Allergy, and Immunology: www.aaaai.org 2. Food Allergy Research and Education (FARE): foodallergy.org 3. Mothers of Asthmatics: http://www.asthmacommunitynetwork.org 4. American College of Allergy, Asthma, and Immunology: www.acaai.org

## 2022-11-10 NOTE — Progress Notes (Unsigned)
FOLLOW UP Date of Service/Encounter:  11/10/22   Subjective:  Kenneth Daniel (DOB: [birth year]) is a 77 y.o. male who [facility] in re-evaluation of the following: allergic rhinitis, pruritus History obtained from: chart review and patient.  For Review, LV was on 04/09/22  with Dr. Dellis Anes seen for routine follow-up. See below for summary of history and diagnostics.  Therapeutic plans/changes recommended: continue fluticasone, azelastine, clarinex and start injections. Continue with triamcinolone as needed.  ----------------------------------------------------- Pertinent History/Diagnostics:  On AIT-2 vials; vial 1 grass pollen, dust mites, and vial 2: mold --------------------------------------------------- Today presents for follow-up. He is doing well on his allergy injections, in red vial/maintenance dose, but has not yet reached 0.5 mL. Due for an injection today. Last injection prior to today was on 11/04/22 and received 0.4 mL of each of his two maintenance vials. Tolerating injections well.  He feels the injections are helping him. He does continue taking his clarinex twice daily.  He uses nasal sprays as needed.  He has had no issues with his itching. This has now resolved. He is pleased with his progress. No steroids or antibiotics since last visit.   Allergies as of 11/10/2022   No Known Allergies      Medication List        Accurate as of November 10, 2022  9:28 PM. If you have any questions, ask your nurse or doctor.          acetaminophen 500 MG tablet Commonly known as: TYLENOL Take 1,000 mg by mouth every 8 (eight) hours as needed for moderate pain.   ALPRAZolam 0.25 MG tablet Commonly known as: XANAX Take 0.25 mg by mouth daily as needed.   aluminum hydroxide-magnesium carbonate 95-358 MG/15ML Susp Commonly known as: GAVISCON Take 30 mLs by mouth as needed for indigestion or heartburn.   amLODipine 10 MG  tablet Commonly known as: NORVASC Take 10 mg by mouth daily.   ascorbic acid 500 MG tablet Commonly known as: VITAMIN C Take 500 mg by mouth daily.   aspirin EC 81 MG tablet Take 81 mg by mouth at bedtime.   atorvastatin 40 MG tablet Commonly known as: LIPITOR Take 40 mg by mouth at bedtime.   Azelastine HCl 137 MCG/SPRAY Soln Place 2 sprays into the nose 2 (two) times daily as needed. What changed: See the new instructions. Changed by: Verlee Monte, MD   b complex vitamins tablet Take 1 tablet by mouth daily as needed.   cholecalciferol 25 MCG (1000 UNIT) tablet Commonly known as: VITAMIN D3 Take 1,000 Units by mouth daily.   CO Q-10 PLUS PO Take 1 tablet by mouth daily at 12 noon.   desloratadine 5 MG tablet Commonly known as: CLARINEX Take 1 tablet (5 mg total) by mouth 2 (two) times daily as needed.   diclofenac Sodium 1 % Gel Commonly known as: VOLTAREN Apply 4 g topically 4 (four) times daily.   Digestive Enzymes Caps Take one 20 minutes before meals   ECHINACEA EXTRACT PO Take 1 capsule by mouth daily as needed (cold symptoms).   EPINEPHrine 0.3 mg/0.3 mL Soaj injection Commonly known as: EPI-PEN Use as directed for life threatening allergic reactions   esomeprazole 40 MG capsule Commonly known as: NEXIUM Take 1 capsule (40 mg total) by mouth daily.   famotidine 40 MG tablet Commonly known as: PEPCID Take 40 mg by mouth daily.   fluticasone 50 MCG/ACT nasal spray Commonly known as: FLONASE  Place 2 sprays into both nostrils daily.   hydrOXYzine 25 MG capsule Commonly known as: VISTARIL Take 25 mg by mouth at bedtime as needed.   JUICE PLUS FIBRE PO Take 2 capsules by mouth daily.   Magnesium 250 MG Tabs Take 250 mg by mouth at bedtime.   Melatonin 5 MG Caps Take 10 mg by mouth at bedtime.   MIRALAX PO Take by mouth daily.   nebivolol 10 MG tablet Commonly known as: BYSTOLIC TAKE 1 TABLET BY MOUTH EVERY DAY IN THE MORNING    PROBIOTIC DAILY PO Take 1 capsule by mouth daily.   triamcinolone cream 0.1 % Commonly known as: KENALOG Apply 1 application. topically 2 (two) times daily.   valsartan-hydrochlorothiazide 320-12.5 MG tablet Commonly known as: DIOVAN-HCT Take 1 tablet by mouth daily.   Xtandi 40 MG tablet Generic drug: enzalutamide Take 80 mg by mouth 2 (two) times daily.   zinc gluconate 50 MG tablet Take 50 mg by mouth daily as needed (supplement).       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, right    per last duplex in epic 06-04-2020  right ICA 16-49% and right ECA >50%   Chronic constipation    GERD (gastroesophageal reflux disease)    History of acute pyelonephritis 09/2020   w/ admission in epic due to severe sepsis   History of COVID-19 11/18/2020   positive result in epic;   per pt mild to moderate symptoms that resolved   History of external beam radiation therapy    completed in 2015 for recurrent prostate cancer (done in [facility])   History of prostate cancer    (current urologist-- dr Benancio Deeds, and current treatment lupron injeciton q 6months) ;   per pt in [place], [place] 2004  s/p radical prostatectomy;   recurrence 2015 completed radiation   HLD (hyperlipidemia)    Hypertension    followed by pcp and cardiology---   (05-29-2020  nuclear stress test in epic , low risk no ischemia nuclear ef 63%)   Inguinal hernia, right    Non-rheumatic aortic regurgitation    mild to moderate AR without stenosis per last echo in epic 02-27-2021   Perennial allergic rhinitis    followed by dr gollager ([facility])   Wears dentures    upper   Wears glasses    Wears hearing aid in both ears    Past Surgical History:  Procedure Laterality Date   ANTERIOR CERVICAL DECOMP/DISCECTOMY FUSION N/A 11/09/2019   Procedure: ANTERIOR CERVICAL DECOMPRESSION FUSION CERVICAL 3-4 WITH INSTRUMENTATION AND ALLOGRAFT;  Surgeon:  Estill Bamberg, MD;  [facility]: [facility];  Service: Orthopedics;  Laterality: N/A;   CARDIAC CATHETERIZATION  2002   in [place], [place];   per pt stress test with possible ischemia,  told arteries normal   COLONOSCOPY  2020   CYST EXCISION  2005   per pt from back , was benign   INGUINAL HERNIA REPAIR Right 06/04/2021   Procedure: OPEN RIGHT INGUINAL HERNIA REPAIR;  Surgeon: Berna Bue, MD;  Location: [facility];  Service: General;  Laterality: Right;   PROSTATECTOMY  2004   in [place], [place]   UPPER GI ENDOSCOPY  2021   Otherwise, there have been no changes to his past medical history, surgical history, family history, or social history.  ROS: All others negative except as noted per HPI.   Objective:  BP 138/82  Pulse 80   Temp 98.6 F (37 C)   Resp 18   Ht 6\' 2"  (1.88 m)   Wt 176 lb 12.8 oz (80.2 kg)   SpO2 96%   BMI 22.70 kg/m  Body mass index is 22.7 kg/m. Physical Exam: General Appearance:  Alert, cooperative, no distress, appears stated age  Head:  Normocephalic, without obvious abnormality, atraumatic  Eyes:  Conjunctiva clear, EOM's intact  Nose: Nares normal, hypertrophic turbinates and normal mucosa  Throat: Lips, tongue normal; teeth and gums normal, normal posterior oropharynx  Neck: Supple, symmetrical  Lungs:   clear to auscultation bilaterally, Respirations unlabored, no coughing  Heart:  regular rate and rhythm and no murmur, Appears well perfused  Extremities: No edema  Skin: Skin color, texture, turgor normal and no rashes or lesions on visualized portions of skin  Neurologic: No gross deficits   Assessment/Plan   Allergic rhinitis (grasses, indoor molds, dust mites)-controlled - Continue fluticasone 1 spray in each nostril twice a day as needed for stuffy nose.   - Continue azelastine 2 sprays in each nostril twice a day as needed for runny nose or itch. - Continue Clarinex 5 mg 1-2 times daily. - Consider saline nasal rinses as needed for  nasal symptoms.  - Continue allergy injections and bring your uptodate epinephrine auoinjector with you to all appointments.  Injection given in clinic today.  2. Pruritus-controlled - Continue a twice a day moisturizing routine - Continue Clarinex once a day as needed for itch (as listed above). - Continue with triamcinolone cream to use as needed (avoid the face).  3. Return in about 1 year (around 11/10/2023).   Tonny Bollman, MD  [facility]

## 2022-11-13 ENCOUNTER — Other Ambulatory Visit: Payer: Self-pay | Admitting: Allergy & Immunology

## 2022-11-13 DIAGNOSIS — R109 Unspecified abdominal pain: Secondary | ICD-10-CM | POA: Diagnosis not present

## 2022-11-20 ENCOUNTER — Encounter (HOSPITAL_COMMUNITY): Payer: Self-pay | Admitting: *Deleted

## 2022-11-20 ENCOUNTER — Ambulatory Visit (HOSPITAL_COMMUNITY)
Admission: EM | Admit: 2022-11-20 | Discharge: 2022-11-20 | Disposition: A | Discharge status: Home / Self Care | Payer: Medicare PPO

## 2022-11-20 DIAGNOSIS — R110 Nausea: Secondary | ICD-10-CM | POA: Diagnosis not present

## 2022-11-20 DIAGNOSIS — A048 Other specified bacterial intestinal infections: Secondary | ICD-10-CM

## 2022-11-20 MED ORDER — CLARITHROMYCIN 500 MG PO TABS: 500.0000 mg | ORAL_TABLET | Freq: Two times a day (BID) | ORAL | 0 refills | Status: AC

## 2022-11-20 MED ORDER — AMOXICILLIN 500 MG PO CAPS: 1000.0000 mg | ORAL_CAPSULE | Freq: Two times a day (BID) | ORAL | 0 refills | Status: AC

## 2022-11-20 MED ORDER — PANTOPRAZOLE SODIUM 20 MG PO TBEC: 20.0000 mg | DELAYED_RELEASE_TABLET | Freq: Every day | ORAL | 0 refills | Status: DC

## 2022-11-20 NOTE — ED Triage Notes (Signed)
Pt states he has taken a H-pylori test at his PCP office Dr Parke Simmers and they called him this morning and advised him he was positive but didn't treat him advised him since he was having nausea to come to UC. Pt states he has had on going nausea he does take Nexium and Carafate everyday.

## 2022-11-20 NOTE — ED Provider Notes (Signed)
[facility]    CSN: [id number] Arrival date & time: 11/20/22  4782      History   Chief Complaint Chief Complaint  Patient presents with   Nausea    HPI Kenneth Daniel is a 77 y.o. male.   Patient presents to clinic for abdominal discomfort and nausea that have been ongoing for the past 1.5 weeks, but his symptoms got much worse this AM. Reports testing positive for H. Pylori yesterday, and has not heard back from his PCP to start him on treatment. He spoke with them today and they advised him to go to the UC for his nasuea and adominal discomfort. Reports nausea w/o emesis. No diarrhea. No chest pain.     The history is provided by the patient and medical records.    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, right    per last duplex in epic 06-04-2020  right ICA 16-49% and right ECA >50%   Chronic constipation    GERD (gastroesophageal reflux disease)    History of acute pyelonephritis 09/2020   w/ admission in epic due to severe sepsis   History of COVID-19 11/18/2020   positive result in epic;   per pt mild to moderate symptoms that resolved   History of external beam radiation therapy    completed in 2015 for recurrent prostate cancer (done in [facility])   History of prostate cancer    (current urologist-- dr Benancio Deeds, and current treatment lupron injeciton q 6months) ;   per pt in [place], [place] 2004  s/p radical prostatectomy;   recurrence 2015 completed radiation   HLD (hyperlipidemia)    Hypertension    followed by pcp and cardiology---   (05-29-2020  nuclear stress test in epic , low risk no ischemia nuclear ef 63%)   Inguinal hernia, right    Non-rheumatic aortic regurgitation    mild to moderate AR without stenosis per last echo in epic 02-27-2021   Perennial allergic rhinitis    followed by dr gollager ([facility])   Wears dentures    upper   Wears glasses    Wears hearing  aid in both ears     Patient Active Problem List   Diagnosis Date Noted   Nonrheumatic aortic valve insufficiency    Perennial allergic rhinitis 12/12/2020   Pruritus 12/12/2020   Seasonal allergic conjunctivitis 12/12/2020   Acute pyelonephritis 09/22/2020   Pyelonephritis 09/22/2020   Myelopathy (HCC) 11/09/2019    Past Surgical History:  Procedure Laterality Date   ANTERIOR CERVICAL DECOMP/DISCECTOMY FUSION N/A 11/09/2019   Procedure: ANTERIOR CERVICAL DECOMPRESSION FUSION CERVICAL 3-4 WITH INSTRUMENTATION AND ALLOGRAFT;  Surgeon: Estill Bamberg, MD;  [facility]: [facility];  Service: Orthopedics;  Laterality: N/A;   CARDIAC CATHETERIZATION  2002   in [place], [place];   per pt stress test with possible ischemia,  told arteries normal   COLONOSCOPY  2020   CYST EXCISION  2005   per pt from back , was benign   INGUINAL HERNIA REPAIR Right 06/04/2021   Procedure: OPEN RIGHT INGUINAL HERNIA REPAIR;  Surgeon: Berna Bue, MD;  Location: [facility];  Service: General;  Laterality: Right;   PROSTATECTOMY  2004   in [place], [place]   UPPER GI ENDOSCOPY  2021       Home Medications    Prior to Admission medications   Medication Sig Start Date End Date Taking? Authorizing Provider  acetaminophen (TYLENOL) 500 MG tablet Take 1,000 mg by mouth every 8 (eight) hours as needed for moderate pain.   Yes [provider]  ALPRAZolam (XANAX) 0.25 MG tablet Take 0.25 mg by mouth daily as needed. 12/26/21  Yes [provider]  aluminum hydroxide-magnesium carbonate (GAVISCON) 95-358 MG/15ML SUSP Take 30 mLs by mouth as needed for indigestion or heartburn.   Yes [provider]  amLODipine (NORVASC) 10 MG tablet Take 10 mg by mouth daily.   Yes [provider]  amoxicillin (AMOXIL) 500 MG capsule Take 2 capsules (1,000 mg total) by mouth 2 (two) times daily for 10 days. 11/20/22 11/30/22 Yes Rinaldo Ratel, [place], FNP  ascorbic acid (VITAMIN C) 500 MG tablet  Take 500 mg by mouth daily.   Yes [provider]  aspirin 81 MG EC tablet Take 81 mg by mouth at bedtime.   Yes [provider]  atorvastatin (LIPITOR) 40 MG tablet Take 40 mg by mouth at bedtime.   Yes [provider]  Azelastine HCl 137 MCG/SPRAY SOLN PLACE 2 SPRAYS INTO BOTH NOSTRILS 2 (TWO) TIMES DAILY 11/13/22  Yes Alfonse Spruce, MD  b complex vitamins tablet Take 1 tablet by mouth daily as needed.   Yes [provider]  cholecalciferol (VITAMIN D3) 25 MCG (1000 UNIT) tablet Take 1,000 Units by mouth daily.   Yes [provider]  clarithromycin (BIAXIN) 500 MG tablet Take 1 tablet (500 mg total) by mouth 2 (two) times daily for 14 days. 11/20/22 12/04/22 Yes Rinaldo Ratel, [place], FNP  Coenzyme Q10-levOCARNitine (CO Q-10 PLUS PO) Take 1 tablet by mouth daily at 12 noon.   Yes [provider]  desloratadine (CLARINEX) 5 MG tablet TAKE 1 TABLET BY MOUTH 2 TIMES DAILY AS NEEDED. 11/13/22  Yes Alfonse Spruce, MD  diclofenac Sodium (VOLTAREN) 1 % GEL Apply 4 g topically 4 (four) times daily. 07/03/21  Yes Melene Plan, DO  Digestive Enzymes CAPS Take one 20 minutes before meals 11/09/22  Yes Rodriguez-Southworth, Nettie Elm, PA-C  ECHINACEA EXTRACT PO Take 1 capsule by mouth daily as needed (cold symptoms).   Yes [provider]  esomeprazole (NEXIUM) 40 MG capsule Take 1 capsule (40 mg total) by mouth daily. 11/09/22  Yes Rodriguez-Southworth, Nettie Elm, PA-C  famotidine (PEPCID) 40 MG tablet Take 40 mg by mouth daily.   Yes [provider]  fluticasone (FLONASE) 50 MCG/ACT nasal spray Place 2 sprays into both nostrils daily. 11/10/22  Yes Verlee Monte, MD  hydrOXYzine (VISTARIL) 25 MG capsule Take 25 mg by mouth at bedtime as needed. 11/25/21  Yes [provider]  Magnesium 250 MG TABS Take 250 mg by mouth at bedtime.   Yes [provider]  megestrol (MEGACE) 20 MG tablet Take 20 mg by mouth daily. 10/28/22  Yes  [provider]  Melatonin 5 MG CAPS Take 10 mg by mouth at bedtime.   Yes [provider]  nebivolol (BYSTOLIC) 10 MG tablet TAKE 1 TABLET BY MOUTH EVERY DAY IN THE MORNING 10/22/22  Yes Tolia, Sunit, DO  Nutritional Supplements (JUICE PLUS FIBRE PO) Take 2 capsules by mouth daily.   Yes [provider]  pantoprazole (PROTONIX) 20 MG tablet Take 1 tablet (20 mg total) by mouth daily for 14 days. 11/20/22 12/04/22 Yes Rinaldo Ratel, [place], FNP  Polyethylene Glycol 3350 (MIRALAX PO) Take by mouth daily.   Yes [provider]  Probiotic Product (PROBIOTIC DAILY PO) Take 1 capsule by mouth daily.  Yes [provider]  sucralfate (CARAFATE) 1 GM/10ML suspension SMARTSIG:2 Teaspoon By Mouth 4 Times Daily 11/10/22  Yes [provider]  triamcinolone cream (KENALOG) 0.1 % Apply 1 application. topically 2 (two) times daily. 09/02/21  Yes Alfonse Spruce, MD  valsartan-hydrochlorothiazide (DIOVAN-HCT) 320-12.5 MG tablet Take 1 tablet by mouth daily. 02/21/20  Yes [provider]  Vitamin D, Ergocalciferol, (DRISDOL) 1.25 MG (50000 UNIT) CAPS capsule Take 50,000 Units by mouth once a week. 09/25/22  Yes [provider]  XTANDI 40 MG tablet Take 80 mg by mouth 2 (two) times daily. 10/19/22  Yes [provider]  zinc gluconate 50 MG tablet Take 50 mg by mouth daily as needed (supplement).   Yes [provider]  EPINEPHrine 0.3 mg/0.3 mL IJ SOAJ injection Use as directed for life threatening allergic reactions 11/10/22   Verlee Monte, MD    Family History Family History  Problem Relation Age of Onset   Pancreatic cancer Mother    Prostate cancer Father     Social History Social History   Tobacco Use   Smoking status: Former    Current packs/day: 0.00    Types: Cigarettes    Start date: 1969    Quit date: 1973    Years since quitting: 51.5   Smokeless tobacco: Never  Vaping Use   Vaping status: Never Used   Substance Use Topics   Alcohol use: Never   Drug use: Never     Allergies   Patient has no known allergies.   Review of Systems Review of Systems  Constitutional:  Negative for fever.  Respiratory:  Negative for cough.   Cardiovascular:  Negative for chest pain.  Gastrointestinal:  Positive for abdominal pain and nausea. Negative for blood in stool, constipation, diarrhea and vomiting.     Physical Exam Triage Vital Signs ED Triage Vitals  Encounter Vitals Group     BP 11/20/22 1125 (!) 161/91     Systolic BP Percentile --      Diastolic BP Percentile --      Pulse Rate 11/20/22 1125 77     Resp 11/20/22 1125 18     Temp 11/20/22 1125 98.1 F (36.7 C)     Temp Source 11/20/22 1125 Oral     SpO2 11/20/22 1125 96 %     Weight --      Height --      Head Circumference --      Peak Flow --      Pain Score 11/20/22 1123 0     Pain Loc --      Pain Education --      Exclude from Growth Chart --    No data found.  Updated Vital Signs BP (!) 161/91 (BP Location: Left Arm)   Pulse 77   Temp 98.1 F (36.7 C) (Oral)   Resp 18   SpO2 96%   Visual Acuity Right Eye Distance:   Left Eye Distance:   Bilateral Distance:    Right Eye Near:   Left Eye Near:    Bilateral Near:     Physical Exam Vitals and nursing note reviewed.  Constitutional:      Appearance: Normal appearance.  HENT:     Head: Normocephalic and atraumatic.     Right Ear: External ear normal.     Left Ear: External ear normal.     Nose: Nose normal.     Mouth/Throat:     Mouth: Mucous membranes are  moist.  Eyes:     Conjunctiva/sclera: Conjunctivae normal.  Cardiovascular:     Rate and Rhythm: Normal rate and regular rhythm.  Pulmonary:     Effort: Pulmonary effort is normal. No respiratory distress.  Abdominal:     General: Abdomen is flat. Bowel sounds are normal. There is no distension.     Palpations: Abdomen is soft. There is no mass.     Tenderness: There is no abdominal  tenderness. There is no guarding.     Hernia: No hernia is present.  Musculoskeletal:        General: Normal range of motion.  Skin:    General: Skin is warm and dry.  Neurological:     General: No focal deficit present.     Mental Status: He is alert.  Psychiatric:        Mood and Affect: Mood normal.        Behavior: Behavior is cooperative.      UC Treatments / Results  Labs (all labs ordered are listed, but only abnormal results are displayed) Labs Reviewed - No data to display  EKG   Radiology No results found.  Procedures Procedures (including critical care time)  Medications Ordered in UC Medications - No data to display  Initial Impression / Assessment and Plan / UC Course  I have reviewed the triage vital signs and the nursing notes.  Pertinent labs & imaging results that were available during my care of the patient were reviewed by me and considered in my medical decision making (see chart for details).  Vitals and triage reviewed, patient is hemodynamically stable. Abdomen is soft and non-tender w/ active BS. Patient is symptomatic w/ H. Pylori symptoms, has failed PPI. Will treat w/ triple therapy and advised to avoid atorvastatin for the next 14 days d/t potential for liver injury versus rhabdo. Encouraged to f/u w/ GI or PCP. POC, f/u care and return precautions given, no questions at this time.      Final Clinical Impressions(s) / UC Diagnoses   Final diagnoses:  Nausea  H. pylori infection     Discharge Instructions      I am starting you on treatment for H. Pylori infection. This treatment consists of three different medications, amoxicillin (antibiotic), clarithromycin (antibiotic) and pantoprazole (proton pump inhibitor). Due to medication interactions, please stop taking your atorvastatin over the next 14 days to avoid any liver injury. Take antibiotics with food. If you are taking the pantoprazole, do not take any more of the Nexium, as these  are the same class of medications.   It is important that you follow-up with either your primary care provider or with a gastroenterologist within the next week or so for re-evaluation.       ED Prescriptions     Medication Sig Dispense Auth. Provider   clarithromycin (BIAXIN) 500 MG tablet Take 1 tablet (500 mg total) by mouth 2 (two) times daily for 14 days. 28 tablet Rinaldo Ratel, [place] N, [place]   amoxicillin (AMOXIL) 500 MG capsule Take 2 capsules (1,000 mg total) by mouth 2 (two) times daily for 10 days. 40 capsule Rinaldo Ratel, [place] N, [place]   pantoprazole (PROTONIX) 20 MG tablet Take 1 tablet (20 mg total) by mouth daily for 14 days. 14 tablet Domenique Southers, [place] N, [place]      PDMP not reviewed this encounter.   Natia Fahmy, [place] N, [place] 11/20/22 [phone]

## 2022-11-20 NOTE — Discharge Instructions (Addendum)
I am starting you on treatment for H. Pylori infection. This treatment consists of three different medications, amoxicillin (antibiotic), clarithromycin (antibiotic) and pantoprazole (proton pump inhibitor). Due to medication interactions, please stop taking your atorvastatin over the next 14 days to avoid any liver injury. Take antibiotics with food. If you are taking the pantoprazole, do not take any more of the Nexium, as these are the same class of medications.   It is important that you follow-up with either your primary care provider or with a gastroenterologist within the next week or so for re-evaluation.

## 2022-11-21 ENCOUNTER — Ambulatory Visit (HOSPITAL_COMMUNITY)
Admission: EM | Admit: 2022-11-21 | Discharge: 2022-11-21 | Disposition: A | Discharge status: Home / Self Care | Payer: Medicare PPO | Attending: Emergency Medicine | Admitting: Emergency Medicine

## 2022-11-21 ENCOUNTER — Encounter (HOSPITAL_COMMUNITY): Payer: Self-pay

## 2022-11-21 DIAGNOSIS — R519 Headache, unspecified: Secondary | ICD-10-CM | POA: Insufficient documentation

## 2022-11-21 DIAGNOSIS — B9681 Helicobacter pylori [H. pylori] as the cause of diseases classified elsewhere: Secondary | ICD-10-CM | POA: Insufficient documentation

## 2022-11-21 DIAGNOSIS — R110 Nausea: Secondary | ICD-10-CM | POA: Diagnosis not present

## 2022-11-21 DIAGNOSIS — Z1152 Encounter for screening for COVID-19: Secondary | ICD-10-CM | POA: Insufficient documentation

## 2022-11-21 DIAGNOSIS — A048 Other specified bacterial intestinal infections: Secondary | ICD-10-CM | POA: Diagnosis not present

## 2022-11-21 NOTE — Discharge Instructions (Addendum)
Please keep your scheduled appointment with your primary care provider on Monday.  I advise continuing on the triple therapy for your H. pylori infection and following up with a gastroenterologist afterwards to ensure therapy was efficient.  Your EKG looked normal today in clinic.  Please ensure you are eating small meals that are not overly processed or acidic in nature, as this may worsen your nausea and bloating.  Please seek immediate care if you develop worsening of chest pain, shortness of breath, vomiting, or any new concerning symptoms.

## 2022-11-21 NOTE — ED Triage Notes (Signed)
Here for nausea and headache that started this morning. Pt states he is taken medication for H-pylori.

## 2022-11-21 NOTE — ED Provider Notes (Signed)
[facility]    CSN: [id number] Arrival date & time: 11/21/22  1057      History   Chief Complaint Chief Complaint  Patient presents with   Headache    HPI Kenneth Daniel is a 77 y.o. male.   Patient returns to clinic today reporting continuous nausea and a mild headache.  He is also having intermittent left-sided chest discomfort,  'feels like my heart muscle is pulled.'   He started on triple therapy for H. pylori infection yesterday, after his initial dose he felt much better.  When he took the evening dose he developed nausea.  He did have half a [employer] breakfast sandwich this morning and afterwards developed nausea and bloating.  He did call his primary care office, he has an appointment on Monday.      The history is provided by the patient and medical records.  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, right    per last duplex in epic 06-04-2020  right ICA 16-49% and right ECA >50%   Chronic constipation    GERD (gastroesophageal reflux disease)    History of acute pyelonephritis 09/2020   w/ admission in epic due to severe sepsis   History of COVID-19 11/18/2020   positive result in epic;   per pt mild to moderate symptoms that resolved   History of external beam radiation therapy    completed in 2015 for recurrent prostate cancer (done in [facility])   History of prostate cancer    (current urologist-- dr Benancio Deeds, and current treatment lupron injeciton q 6months) ;   per pt in [place], [place] 2004  s/p radical prostatectomy;   recurrence 2015 completed radiation   HLD (hyperlipidemia)    Hypertension    followed by pcp and cardiology---   (05-29-2020  nuclear stress test in epic , low risk no ischemia nuclear ef 63%)   Inguinal hernia, right    Non-rheumatic aortic regurgitation    mild to moderate AR without stenosis per last echo in epic 02-27-2021   Perennial allergic rhinitis     followed by dr gollager ([facility])   Wears dentures    upper   Wears glasses    Wears hearing aid in both ears     Patient Active Problem List   Diagnosis Date Noted   Nonrheumatic aortic valve insufficiency    Perennial allergic rhinitis 12/12/2020   Pruritus 12/12/2020   Seasonal allergic conjunctivitis 12/12/2020   Acute pyelonephritis 09/22/2020   Pyelonephritis 09/22/2020   Myelopathy (HCC) 11/09/2019    Past Surgical History:  Procedure Laterality Date   ANTERIOR CERVICAL DECOMP/DISCECTOMY FUSION N/A 11/09/2019   Procedure: ANTERIOR CERVICAL DECOMPRESSION FUSION CERVICAL 3-4 WITH INSTRUMENTATION AND ALLOGRAFT;  Surgeon: Estill Bamberg, MD;  [facility];  Service: Orthopedics;  Laterality: N/A;   CARDIAC CATHETERIZATION  2002   in [place], [place];   per pt stress test with possible ischemia,  told arteries normal   COLONOSCOPY  2020   CYST EXCISION  2005   per pt from back , was benign   INGUINAL HERNIA REPAIR Right 06/04/2021   Procedure: OPEN RIGHT INGUINAL HERNIA REPAIR;  Surgeon: Berna Bue, MD;  Location: [facility];  Service: General;  Laterality: Right;   PROSTATECTOMY  2004   in [place], [place]   UPPER GI ENDOSCOPY  2021       Home Medications  Prior to Admission medications   Medication Sig Start Date End Date Taking? Authorizing Provider  ALPRAZolam (XANAX) 0.25 MG tablet Take 0.25 mg by mouth daily as needed. 12/26/21  Yes [provider]  amLODipine (NORVASC) 10 MG tablet Take 10 mg by mouth daily.   Yes [provider]  ascorbic acid (VITAMIN C) 500 MG tablet Take 500 mg by mouth daily.   Yes [provider]  aspirin 81 MG EC tablet Take 81 mg by mouth at bedtime.   Yes [provider]  atorvastatin (LIPITOR) 40 MG tablet Take 40 mg by mouth at bedtime.   Yes [provider]  Azelastine HCl 137 MCG/SPRAY SOLN PLACE 2 SPRAYS INTO BOTH NOSTRILS 2 (TWO) TIMES DAILY 11/13/22  Yes  Alfonse Spruce, MD  cholecalciferol (VITAMIN D3) 25 MCG (1000 UNIT) tablet Take 1,000 Units by mouth daily.   Yes [provider]  desloratadine (CLARINEX) 5 MG tablet TAKE 1 TABLET BY MOUTH 2 TIMES DAILY AS NEEDED. 11/13/22  Yes Alfonse Spruce, MD  hydrOXYzine (VISTARIL) 25 MG capsule Take 25 mg by mouth at bedtime as needed. 11/25/21  Yes [provider]  Probiotic Product (PROBIOTIC DAILY PO) Take 1 capsule by mouth daily.   Yes [provider]  valsartan-hydrochlorothiazide (DIOVAN-HCT) 320-12.5 MG tablet Take 1 tablet by mouth daily. 02/21/20  Yes [provider]  acetaminophen (TYLENOL) 500 MG tablet Take 1,000 mg by mouth every 8 (eight) hours as needed for moderate pain.    [provider]  aluminum hydroxide-magnesium carbonate (GAVISCON) 95-358 MG/15ML SUSP Take 30 mLs by mouth as needed for indigestion or heartburn.    [provider]  amoxicillin (AMOXIL) 500 MG capsule Take 2 capsules (1,000 mg total) by mouth 2 (two) times daily for 10 days. 11/20/22 11/30/22  Demon Volante, [place] N, FNP  b complex vitamins tablet Take 1 tablet by mouth daily as needed.    [provider]  clarithromycin (BIAXIN) 500 MG tablet Take 1 tablet (500 mg total) by mouth 2 (two) times daily for 14 days. 11/20/22 12/04/22  Taytem Ghattas, [place] N, FNP  Coenzyme Q10-levOCARNitine (CO Q-10 PLUS PO) Take 1 tablet by mouth daily at 12 noon.    [provider]  diclofenac Sodium (VOLTAREN) 1 % GEL Apply 4 g topically 4 (four) times daily. 07/03/21   Melene Plan, DO  Digestive Enzymes CAPS Take one 20 minutes before meals 11/09/22   Rodriguez-Southworth, Nettie Elm, PA-C  ECHINACEA EXTRACT PO Take 1 capsule by mouth daily as needed (cold symptoms).    [provider]  EPINEPHrine 0.3 mg/0.3 mL IJ SOAJ injection Use as directed for life threatening allergic reactions 11/10/22   Verlee Monte, MD  esomeprazole (NEXIUM) 40 MG capsule Take 1 capsule  (40 mg total) by mouth daily. 11/09/22   Rodriguez-Southworth, Nettie Elm, PA-C  famotidine (PEPCID) 40 MG tablet Take 40 mg by mouth daily.    [provider]  fluticasone (FLONASE) 50 MCG/ACT nasal spray Place 2 sprays into both nostrils daily. 11/10/22   Verlee Monte, MD  Magnesium 250 MG TABS Take 250 mg by mouth at bedtime.    [provider]  megestrol (MEGACE) 20 MG tablet Take 20 mg by mouth daily. 10/28/22   [provider]  Melatonin 5 MG CAPS Take 10 mg by mouth at bedtime.    [provider]  nebivolol (BYSTOLIC) 10 MG tablet TAKE 1 TABLET BY MOUTH EVERY DAY IN THE MORNING 10/22/22   Odis Hollingshead, [place],  DO  Nutritional Supplements (JUICE PLUS FIBRE PO) Take 2 capsules by mouth daily.    [provider]  pantoprazole (PROTONIX) 20 MG tablet Take 1 tablet (20 mg total) by mouth daily for 14 days. 11/20/22 12/04/22  Emarie Paul, [place] N, FNP  Polyethylene Glycol 3350 (MIRALAX PO) Take by mouth daily.    [provider]  sucralfate (CARAFATE) 1 GM/10ML suspension SMARTSIG:2 Teaspoon By Mouth 4 Times Daily 11/10/22   [provider]  triamcinolone cream (KENALOG) 0.1 % Apply 1 application. topically 2 (two) times daily. 09/02/21   Alfonse Spruce, MD  Vitamin D, Ergocalciferol, (DRISDOL) 1.25 MG (50000 UNIT) CAPS capsule Take 50,000 Units by mouth once a week. 09/25/22   [provider]  XTANDI 40 MG tablet Take 80 mg by mouth 2 (two) times daily. 10/19/22   [provider]  zinc gluconate 50 MG tablet Take 50 mg by mouth daily as needed (supplement).    [provider]    Family History Family History  Problem Relation Age of Onset   Pancreatic cancer Mother    Prostate cancer Father     Social History Social History   Tobacco Use   Smoking status: Former    Current packs/day: 0.00    Types: Cigarettes    Start date: 1969    Quit date: 1973    Years since quitting: 51.5   Smokeless tobacco: Never   Vaping Use   Vaping status: Never Used  Substance Use Topics   Alcohol use: Never   Drug use: Never     Allergies   Patient has no known allergies.   Review of Systems Review of Systems  Neurological:  Positive for headaches.     Physical Exam Triage Vital Signs ED Triage Vitals [11/21/22 1131]  Encounter Vitals Group     BP (!) 166/83     Systolic BP Percentile      Diastolic BP Percentile      Pulse Rate 88     Resp 16     Temp 97.7 F (36.5 C)     Temp Source Oral     SpO2 94 %     Weight      Height      Head Circumference      Peak Flow      Pain Score      Pain Loc      Pain Education      Exclude from Growth Chart    No data found.  Updated Vital Signs BP (!) 166/83 (BP Location: Left Arm)   Pulse 88   Temp 97.7 F (36.5 C) (Oral)   Resp 16   SpO2 94%   Visual Acuity Right Eye Distance:   Left Eye Distance:   Bilateral Distance:    Right Eye Near:   Left Eye Near:    Bilateral Near:     Physical Exam   UC Treatments / Results  Labs (all labs ordered are listed, but only abnormal results are displayed) Labs Reviewed  SARS CORONAVIRUS 2 (TAT 6-24 HRS)    EKG   Radiology No results found.  Procedures Procedures (including critical care time)  Medications Ordered in UC Medications - No data to display  Initial Impression / Assessment and Plan / UC Course  I have reviewed the triage vital signs and the nursing notes.  Pertinent labs & imaging results that were available during my care of the patient were reviewed by me and considered in  my medical decision making (see chart for details).  Vitals and triage reviewed, patient is hemodynamically stable.  Has been having intermittent left-sided chest tightness.  EKG shows normal sinus rhythm with a rate of 77 bpm, without ST elevation or ST depression.  Low concern for acute cardiac etiology.  Nausea and headache, will swab for COVID-19.  Encouraged to continue on triple therapy  and follow-up with primary care as scheduled on Monday.  Plan of care, follow-up care and return precautions given, no questions at this time.    Final Clinical Impressions(s) / UC Diagnoses   Final diagnoses:  H. pylori infection  Nausea  Acute intractable headache, unspecified headache type     Discharge Instructions      Please keep your scheduled appointment with your primary care provider on Monday.  I advise continuing on the triple therapy for your H. pylori infection and following up with a gastroenterologist afterwards to ensure therapy was efficient.  Your EKG looked normal today in clinic.  Please ensure you are eating small meals that are not overly processed or acidic in nature, as this may worsen your nausea and bloating.  Please seek immediate care if you develop worsening of chest pain, shortness of breath, vomiting, or any new concerning symptoms.      ED Prescriptions   None    PDMP not reviewed this encounter.   Tylin Stradley, [place] N, [place] 11/21/22 [phone]

## 2022-11-22 LAB — SARS CORONAVIRUS 2 (TAT 6-24 HRS): SARS Coronavirus 2: NEGATIVE

## 2022-11-23 DIAGNOSIS — I10 Essential (primary) hypertension: Secondary | ICD-10-CM | POA: Diagnosis not present

## 2022-11-23 DIAGNOSIS — D4110 Neoplasm of uncertain behavior of unspecified renal pelvis: Secondary | ICD-10-CM | POA: Diagnosis not present

## 2022-11-23 DIAGNOSIS — F064 Anxiety disorder due to known physiological condition: Secondary | ICD-10-CM | POA: Diagnosis not present

## 2022-11-23 DIAGNOSIS — B9681 Helicobacter pylori [H. pylori] as the cause of diseases classified elsewhere: Secondary | ICD-10-CM | POA: Diagnosis not present

## 2022-11-23 DIAGNOSIS — E785 Hyperlipidemia, unspecified: Secondary | ICD-10-CM | POA: Diagnosis not present

## 2022-11-24 ENCOUNTER — Ambulatory Visit (INDEPENDENT_AMBULATORY_CARE_PROVIDER_SITE_OTHER): Payer: Medicare PPO | Admitting: *Deleted

## 2022-11-24 DIAGNOSIS — J309 Allergic rhinitis, unspecified: Secondary | ICD-10-CM

## 2022-11-26 DIAGNOSIS — J302 Other seasonal allergic rhinitis: Secondary | ICD-10-CM

## 2022-11-26 NOTE — Progress Notes (Signed)
VIALS EXP 11-26-23

## 2022-11-27 ENCOUNTER — Encounter (HOSPITAL_COMMUNITY): Payer: Self-pay

## 2022-11-27 ENCOUNTER — Ambulatory Visit (HOSPITAL_COMMUNITY)
Admission: EM | Admit: 2022-11-27 | Discharge: 2022-11-27 | Disposition: A | Discharge status: Home / Self Care | Payer: Medicare PPO | Attending: Physician Assistant | Admitting: Physician Assistant

## 2022-11-27 DIAGNOSIS — Z8619 Personal history of other infectious and parasitic diseases: Secondary | ICD-10-CM | POA: Insufficient documentation

## 2022-11-27 DIAGNOSIS — R110 Nausea: Secondary | ICD-10-CM

## 2022-11-27 DIAGNOSIS — K2100 Gastro-esophageal reflux disease with esophagitis, without bleeding: Secondary | ICD-10-CM | POA: Insufficient documentation

## 2022-11-27 DIAGNOSIS — J3089 Other allergic rhinitis: Secondary | ICD-10-CM | POA: Diagnosis not present

## 2022-11-27 LAB — COMPREHENSIVE METABOLIC PANEL
ALT: 30 U/L (ref 0–44)
AST: 27 U/L (ref 15–41)
Albumin: 3.7 g/dL (ref 3.5–5.0)
Alkaline Phosphatase: 50 U/L (ref 38–126)
Anion gap: 11 (ref 5–15)
BUN: 13 mg/dL (ref 8–23)
CO2: 26 mmol/L (ref 22–32)
Calcium: 9.4 mg/dL (ref 8.9–10.3)
Chloride: 99 mmol/L (ref 98–111)
Creatinine, Ser: 1.03 mg/dL (ref 0.61–1.24)
GFR, Estimated: >60 mL/min (ref >60)
Glucose, Bld: 94 mg/dL (ref 70–99)
Potassium: 3.8 mmol/L (ref 3.5–5.1)
Sodium: 136 mmol/L (ref 135–145)
Total Bilirubin: 0.8 mg/dL (ref 0.3–1.2)
Total Protein: 7.4 g/dL (ref 6.5–8.1)

## 2022-11-27 LAB — CBC WITH DIFFERENTIAL/PLATELET
Abs Immature Granulocytes: 0.01 10*3/uL (ref 0.00–0.07)
Basophils Absolute: 0 10*3/uL (ref 0.0–0.1)
Basophils Relative: 0 %
Eosinophils Absolute: 0 10*3/uL (ref 0.0–0.5)
Eosinophils Relative: 1 %
HCT: 39.3 % (ref 39.0–52.0)
Hemoglobin: 12.9 g/dL — ABNORMAL LOW (ref 13.0–17.0)
Immature Granulocytes: 0 %
Lymphocytes Relative: 22 %
Lymphs Abs: 0.6 10*3/uL — ABNORMAL LOW (ref 0.7–4.0)
MCH: 27.9 pg (ref 26.0–34.0)
MCHC: 32.8 g/dL (ref 30.0–36.0)
MCV: 84.9 fL (ref 80.0–100.0)
Monocytes Absolute: 0.4 10*3/uL (ref 0.1–1.0)
Monocytes Relative: 14 %
Neutro Abs: 1.6 10*3/uL — ABNORMAL LOW (ref 1.7–7.7)
Neutrophils Relative %: 63 %
Platelets: 187 10*3/uL (ref 150–400)
RBC: 4.63 MIL/uL (ref 4.22–5.81)
RDW: 16.6 % — ABNORMAL HIGH (ref 11.5–15.5)
WBC: 2.6 10*3/uL — ABNORMAL LOW (ref 4.0–10.5)
nRBC: 0 % (ref 0.0–0.2)

## 2022-11-27 LAB — LIPASE, BLOOD: Lipase: 44 U/L (ref 11–51)

## 2022-11-27 MED ORDER — ALUM & MAG HYDROXIDE-SIMETH 200-200-20 MG/5ML PO SUSP
30.0000 mL | Freq: Once | ORAL | Status: AC
  Administered 2022-11-27: 30 mL via ORAL

## 2022-11-27 MED ORDER — ALUM & MAG HYDROXIDE-SIMETH 200-200-20 MG/5ML PO SUSP
ORAL | Status: AC
  Filled 2022-11-27: qty 30

## 2022-11-27 NOTE — ED Triage Notes (Signed)
Patient here today with c/o dizziness today. Patient woke up this morning feeling nauseous. He tried eating some breakfast to see if that would help but did not help. Previously diagnosed with H Pylori. He feels fatigue when walking. Patient states that he did eat a lot of food yesterday because he was feeling better.

## 2022-11-27 NOTE — Discharge Instructions (Signed)
I am glad that you are feeling better after the medication.  I believe your symptoms are related to a flare of acid reflux probably triggered by eating before you went to bed last night.  I would recommend eating small frequent meals and avoid anything spicy or acidic.  Try to eat at least 3 hours before going to bed.  Continue your medication to treat H. pylori as we discussed.  You can use over-the-counter medications as needed.  It is very important that you follow-up with gastroenterology as you likely need an endoscopy and testing to ensure that we have cleared the H. pylori infection.  Make sure you keep your scheduled appointment in a month and if symptoms or not improving try to see if you can be seen sooner.  If anything worsens and you have severe pain, nausea/vomiting interfering with oral intake, blood in your stool, blood in your vomit, weakness, lightheadedness you need to be seen immediately.

## 2022-11-27 NOTE — ED Provider Notes (Signed)
[facility]    CSN: [id number] Arrival date & time: 11/27/22  1128      History   Chief Complaint Chief Complaint  Patient presents with   Dizziness    HPI Kenneth Daniel is a 77 y.o. male.   Patient presents today with a 1 day history of recurrent upper abdominal pain with associated nausea and just feeling poorly.  He has been seen by our clinic several times in the past month.  He was seen on 11/09/2022 which point symptoms were attributed to GERD and he was started on medication.  He was then followed up with his primary care that we are unable to review these records as his primary care office does not use the same EMR.  He presented to our clinic on 11/20/2022 stating tested positive for H. pylori.  He was started on triple therapy and then seen again on 11/21/2022 and encouraged to continue this course of medication.  He reports taking medication as prescribed and was feeling much better until earlier today.  He reports that around 6 AM he was woken up because he had a burning sensation in his esophagus, nausea, upset stomach.  He also felt very fatigued and attention in his head.  He denies any chest pain, shortness of breath, dizziness, lightheadedness.  Denies any blood loss including melena, hematochezia, hematuria.  He has not tried any other over-the-counter medication for symptom management.  He denies any recent illness or additional symptoms.  He does report that yesterday he ate more than he has in the several weeks because he was feeling better and at this a few hours before bed.  Wonders if this could have contributed to symptoms.  He did eat this morning and this did not provide any relief of symptoms but did not have any associated vomiting.  He is scheduled to see gastroenterology on January 27, 2023 but did not feel he could wait to be evaluated.    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, right     per last duplex in epic 06-04-2020  right ICA 16-49% and right ECA >50%   Chronic constipation    GERD (gastroesophageal reflux disease)    History of acute pyelonephritis 09/2020   w/ admission in epic due to severe sepsis   History of COVID-19 11/18/2020   positive result in epic;   per pt mild to moderate symptoms that resolved   History of external beam radiation therapy    completed in 2015 for recurrent prostate cancer (done in [facility])   History of prostate cancer    (current urologist-- dr Benancio Deeds, and current treatment lupron injeciton q 6months) ;   per pt in [place], [place] 2004  s/p radical prostatectomy;   recurrence 2015 completed radiation   HLD (hyperlipidemia)    Hypertension    followed by pcp and cardiology---   (05-29-2020  nuclear stress test in epic , low risk no ischemia nuclear ef 63%)   Inguinal hernia, right    Non-rheumatic aortic regurgitation    mild to moderate AR without stenosis per last echo in epic 02-27-2021   Perennial allergic rhinitis    follow[facility] dr [facility] ([facility])   Wears dentures    upper   Wears glasses    Wears hearing aid in both ears     Patient Active Problem List   Diagnosis Date Noted   Nonrheumatic aortic  valve insufficiency    Perennial allergic rhinitis 12/12/2020   Pruritus 12/12/2020   Seasonal allergic conjunctivitis 12/12/2020   Acute pyelonephritis 09/22/2020   Pyelonephritis 09/22/2020   Myelopathy (HCC) 11/09/2019    Past Surgical History:  Procedure Laterality Date   ANTERIOR CERVICAL DECOMP/DISCECTOMY FUSION N/A 11/09/2019   Procedure: ANTERIOR CERVICAL DECOMPRESSION FUSION CERVICAL 3-4 WITH INSTRUMENTATION AND ALLOGRAFT;  Surgeon: Estill Bamberg, MD;  Location: [facility];  Service: Orthopedics;  Laterality: N/A;   CARDIAC CATHETERIZATION  2002   in [place], [place];   per pt stress test with possible ischemia,  told arteries normal   COLONOSCOPY  2020   CYST EXCISION  2005   per pt from back , was benign    INGUINAL HERNIA REPAIR Right 06/04/2021   Procedure: OPEN RIGHT INGUINAL HERNIA REPAIR;  Surgeon: Berna Bue, MD;  Location: [facility];  Service: General;  Laterality: Right;   PROSTATECTOMY  2004   in [place], [place]   UPPER GI ENDOSCOPY  2021       Home Medications    Prior to Admission medications   Medication Sig Start Date End Date Taking? Authorizing Provider  acetaminophen (TYLENOL) 500 MG tablet Take 1,000 mg by mouth every 8 (eight) hours as needed for moderate pain.    [provider]  ALPRAZolam Prudy Feeler) 0.25 MG tablet Take 0.25 mg by mouth daily as needed. 12/26/21   [provider]  aluminum hydroxide-magnesium carbonate (GAVISCON) 95-358 MG/15ML SUSP Take 30 mLs by mouth as needed for indigestion or heartburn.    [provider]  amLODipine (NORVASC) 10 MG tablet Take 10 mg by mouth daily.    [provider]  amoxicillin (AMOXIL) 500 MG capsule Take 2 capsules (1,000 mg total) by mouth 2 (two) times daily for 10 days. 11/20/22 11/30/22  Garrison, [place] N, FNP  ascorbic acid (VITAMIN C) 500 MG tablet Take 500 mg by mouth daily.    [provider]  aspirin 81 MG EC tablet Take 81 mg by mouth at bedtime.    [provider]  atorvastatin (LIPITOR) 40 MG tablet Take 40 mg by mouth at bedtime.    [provider]  Azelastine HCl 137 MCG/SPRAY SOLN PLACE 2 SPRAYS INTO BOTH NOSTRILS 2 (TWO) TIMES DAILY 11/13/22   Alfonse Spruce, MD  b complex vitamins tablet Take 1 tablet by mouth daily as needed.    [provider]  cholecalciferol (VITAMIN D3) 25 MCG (1000 UNIT) tablet Take 1,000 Units by mouth daily.    [provider]  clarithromycin (BIAXIN) 500 MG tablet Take 1 tablet (500 mg total) by mouth 2 (two) times daily for 14 days. 11/20/22 12/04/22  Garrison, [place] N, FNP  Coenzyme Q10-levOCARNitine (CO Q-10 PLUS PO) Take 1 tablet by mouth daily at 12 noon.    [provider]   desloratadine (CLARINEX) 5 MG tablet TAKE 1 TABLET BY MOUTH 2 TIMES DAILY AS NEEDED. 11/13/22   Alfonse Spruce, MD  diclofenac Sodium (VOLTAREN) 1 % GEL Apply 4 g topically 4 (four) times daily. 07/03/21   Melene Plan, DO  Digestive Enzymes CAPS Take one 20 minutes before meals 11/09/22   Rodriguez-Southworth, Nettie Elm, PA-C  ECHINACEA EXTRACT PO Take 1 capsule by mouth daily as needed (cold symptoms).    [provider]  EPINEPHrine 0.3 mg/0.3 mL IJ SOAJ injection Use as directed for life threatening allergic reactions 11/10/22   Verlee Monte, MD  esomeprazole (NEXIUM) 40 MG capsule Take 1  capsule (40 mg total) by mouth daily. 11/09/22   Rodriguez-Southworth, Nettie Elm, PA-C  famotidine (PEPCID) 40 MG tablet Take 40 mg by mouth daily.    [provider]  fluticasone (FLONASE) 50 MCG/ACT nasal spray Place 2 sprays into both nostrils daily. 11/10/22   Verlee Monte, MD  hydrOXYzine (VISTARIL) 25 MG capsule Take 25 mg by mouth at bedtime as needed. 11/25/21   [provider]  Magnesium 250 MG TABS Take 250 mg by mouth at bedtime.    [provider]  megestrol (MEGACE) 20 MG tablet Take 20 mg by mouth daily. 10/28/22   [provider]  Melatonin 5 MG CAPS Take 10 mg by mouth at bedtime.    [provider]  nebivolol (BYSTOLIC) 10 MG tablet TAKE 1 TABLET BY MOUTH EVERY DAY IN THE MORNING 10/22/22   Tolia, Sunit, DO  Nutritional Supplements (JUICE PLUS FIBRE PO) Take 2 capsules by mouth daily.    [provider]  pantoprazole (PROTONIX) 20 MG tablet Take 1 tablet (20 mg total) by mouth daily for 14 days. 11/20/22 12/04/22  Garrison, [place] N, FNP  Polyethylene Glycol 3350 (MIRALAX PO) Take by mouth daily.    [provider]  Probiotic Product (PROBIOTIC DAILY PO) Take 1 capsule by mouth daily.    [provider]  sucralfate (CARAFATE) 1 GM/10ML suspension SMARTSIG:2 Teaspoon By Mouth 4 Times Daily 11/10/22   [provider]   triamcinolone cream (KENALOG) 0.1 % Apply 1 application. topically 2 (two) times daily. 09/02/21   Alfonse Spruce, MD  valsartan-hydrochlorothiazide (DIOVAN-HCT) 320-12.5 MG tablet Take 1 tablet by mouth daily. 02/21/20   [provider]  Vitamin D, Ergocalciferol, (DRISDOL) 1.25 MG (50000 UNIT) CAPS capsule Take 50,000 Units by mouth once a week. 09/25/22   [provider]  XTANDI 40 MG tablet Take 80 mg by mouth 2 (two) times daily. 10/19/22   [provider]  zinc gluconate 50 MG tablet Take 50 mg by mouth daily as needed (supplement).    [provider]    Family History Family History  Problem Relation Age of Onset   Pancreatic cancer Mother    Prostate cancer Father     Social History Social History   Tobacco Use   Smoking status: Former    Current packs/day: 0.00    Types: Cigarettes    Start date: 1969    Quit date: 1973    Years since quitting: 51.6   Smokeless tobacco: Never  Vaping Use   Vaping status: Never Used  Substance Use Topics   Alcohol use: Never   Drug use: Never     Allergies   Patient has no known allergies.   Review of Systems Review of Systems  Constitutional:  Positive for activity change and fatigue. Negative for appetite change and fever.  Respiratory:  Negative for cough and shortness of breath.   Cardiovascular:  Negative for chest pain.  Gastrointestinal:  Positive for abdominal pain and nausea. Negative for diarrhea and vomiting.  Neurological:  Negative for dizziness, weakness, light-headedness, numbness and headaches.     Physical Exam Triage Vital Signs ED Triage Vitals [11/27/22 1302]  Encounter Vitals Group     BP (!) 145/83     Systolic BP Percentile      Diastolic BP Percentile      Pulse Rate 79     Resp 16     Temp 98.2 F (36.8 C)     Temp Source Oral  SpO2 96 %     Weight 178 lb 9.2 oz (81 kg)     Height 6\' 2"  (1.88 m)     Head Circumference      Peak Flow      Pain  Score 0     Pain Loc      Pain Education      Exclude from Growth Chart    No data found.  Updated Vital Signs BP (!) 145/83 (BP Location: Left Arm)   Pulse 79   Temp 98.2 F (36.8 C) (Oral)   Resp 16   Ht 6\' 2"  (1.88 m)   Wt 178 lb 9.2 oz (81 kg)   SpO2 96%   BMI 22.93 kg/m   Visual Acuity Right Eye Distance:   Left Eye Distance:   Bilateral Distance:    Right Eye Near:   Left Eye Near:    Bilateral Near:     Physical Exam Vitals reviewed.  Constitutional:      General: He is awake.     Appearance: Normal appearance. He is well-developed. He is not ill-appearing.     Comments: Very pleasant male appears stated age in no acute distress sitting comfortably in exam  HENT:     Head: Normocephalic and atraumatic.     Mouth/Throat:     Pharynx: Uvula midline. No oropharyngeal exudate or posterior oropharyngeal erythema.  Cardiovascular:     Rate and Rhythm: Normal rate and regular rhythm.     Heart sounds: Normal heart sounds, S1 normal and S2 normal. No murmur heard. Pulmonary:     Effort: Pulmonary effort is normal.     Breath sounds: Normal breath sounds. No stridor. No wheezing, rhonchi or rales.     Comments: Clear to auscultation bilaterally Abdominal:     General: Bowel sounds are normal.     Palpations: Abdomen is soft.     Tenderness: There is no abdominal tenderness. There is no right CVA tenderness, left CVA tenderness, guarding or rebound.  Neurological:     Mental Status: He is alert.  Psychiatric:        Behavior: Behavior is cooperative.      UC Treatments / Results  Labs (all labs ordered are listed, but only abnormal results are displayed) Labs Reviewed  CBC WITH DIFFERENTIAL/PLATELET  COMPREHENSIVE METABOLIC PANEL  LIPASE, BLOOD    EKG   Radiology No results found.  Procedures Procedures (including critical care time)  Medications Ordered in UC Medications  alum & mag hydroxide-simeth (MAALOX/MYLANTA) 200-200-20 MG/5ML  suspension 30 mL (30 mLs Oral Given 11/27/22 1339)    Initial Impression / Assessment and Plan / UC Course  I have reviewed the triage vital signs and the nursing notes.  Pertinent labs & imaging results that were available during my care of the patient were reviewed by me and considered in my medical decision making (see chart for details).     Patient is well-appearing, afebrile, nontoxic, nontachycardic.  Vital signs and physical exam are reassuring with no indication for emergent evaluation or imaging.  He was given Maalox with significant improvement of symptoms.  I suspect symptoms were GERD flare related to H. pylori infection that is actively being treated triggered by eating a large amount of food late last night.  We discussed that he should eat small frequent meals and avoid eating within a few hours before he goes to bed.  Recommended dietary and lifestyle modification for additional symptom management.  He is on PPI we  discussed that it is important that he continues medication regimen as prescribed.  He has an appointment with gastroenterology on 01/27/2023 with strongly encouraged to keep this appointment as he will likely need endoscopy and/or testing to ensure eradication of H. pylori.  We discussed that if he continues to have significant symptoms we may see if we can get him his appointment with gastroenterology moved forward for further evaluation and management.  Given he reports associated fatigue will obtain basic lab work including CBC, CMP, lipase given his recurrent nausea.  These labs are pending and we will contact him if they are abnormal.  We discussed that if his symptoms worsen anyway and he has severe abdominal pain, melena, medic easier, nausea/vomiting interfering with oral intake, weakness, lightheadedness he needs to be seen emergently.  Strict return precautions given.  Recommended close follow-up with his primary care.  Final Clinical Impressions(s) / UC Diagnoses    Final diagnoses:  Gastroesophageal reflux disease with esophagitis, unspecified whether hemorrhage  Nausea without vomiting  History of Helicobacter pylori infection     Discharge Instructions      I am glad that you are feeling better after the medication.  I believe your symptoms are related to a flare of acid reflux probably triggered by eating before you went to bed last night.  I would recommend eating small frequent meals and avoid anything spicy or acidic.  Try to eat at least 3 hours before going to bed.  Continue your medication to treat H. pylori as we discussed.  You can use over-the-counter medications as needed.  It is very important that you follow-up with gastroenterology as you likely need an endoscopy and testing to ensure that we have cleared the H. pylori infection.  Make sure you keep your scheduled appointment in a month and if symptoms or not improving try to see if you can be seen sooner.  If anything worsens and you have severe pain, nausea/vomiting interfering with oral intake, blood in your stool, blood in your vomit, weakness, lightheadedness you need to be seen immediately.     ED Prescriptions   None    PDMP not reviewed this encounter.   Jeani Hawking, PA-C 11/27/22 1416

## 2022-12-03 ENCOUNTER — Other Ambulatory Visit (HOSPITAL_COMMUNITY): Payer: Self-pay | Admitting: Family Medicine

## 2022-12-03 DIAGNOSIS — E785 Hyperlipidemia, unspecified: Secondary | ICD-10-CM | POA: Diagnosis not present

## 2022-12-03 DIAGNOSIS — E559 Vitamin D deficiency, unspecified: Secondary | ICD-10-CM | POA: Diagnosis not present

## 2022-12-03 DIAGNOSIS — D4110 Neoplasm of uncertain behavior of unspecified renal pelvis: Secondary | ICD-10-CM

## 2022-12-03 DIAGNOSIS — I10 Essential (primary) hypertension: Secondary | ICD-10-CM | POA: Diagnosis not present

## 2022-12-04 ENCOUNTER — Ambulatory Visit (INDEPENDENT_AMBULATORY_CARE_PROVIDER_SITE_OTHER): Payer: Medicare PPO

## 2022-12-04 DIAGNOSIS — J309 Allergic rhinitis, unspecified: Secondary | ICD-10-CM | POA: Diagnosis not present

## 2022-12-10 ENCOUNTER — Ambulatory Visit (INDEPENDENT_AMBULATORY_CARE_PROVIDER_SITE_OTHER): Payer: Medicare PPO

## 2022-12-10 DIAGNOSIS — J309 Allergic rhinitis, unspecified: Secondary | ICD-10-CM

## 2022-12-14 DIAGNOSIS — I10 Essential (primary) hypertension: Secondary | ICD-10-CM | POA: Diagnosis not present

## 2022-12-14 DIAGNOSIS — N2889 Other specified disorders of kidney and ureter: Secondary | ICD-10-CM | POA: Diagnosis not present

## 2022-12-14 DIAGNOSIS — F419 Anxiety disorder, unspecified: Secondary | ICD-10-CM | POA: Diagnosis not present

## 2022-12-14 DIAGNOSIS — R634 Abnormal weight loss: Secondary | ICD-10-CM | POA: Diagnosis not present

## 2022-12-14 DIAGNOSIS — D4110 Neoplasm of uncertain behavior of unspecified renal pelvis: Secondary | ICD-10-CM | POA: Diagnosis not present

## 2022-12-14 DIAGNOSIS — R531 Weakness: Secondary | ICD-10-CM | POA: Diagnosis not present

## 2022-12-15 ENCOUNTER — Ambulatory Visit (INDEPENDENT_AMBULATORY_CARE_PROVIDER_SITE_OTHER): Payer: Medicare PPO | Admitting: *Deleted

## 2022-12-15 DIAGNOSIS — J309 Allergic rhinitis, unspecified: Secondary | ICD-10-CM

## 2022-12-17 ENCOUNTER — Emergency Department (HOSPITAL_COMMUNITY)
Admission: EM | Admit: 2022-12-17 | Discharge: 2022-12-18 | Disposition: A | Discharge status: Home / Self Care | Payer: Medicare PPO | Attending: Emergency Medicine | Admitting: Emergency Medicine

## 2022-12-17 ENCOUNTER — Other Ambulatory Visit: Payer: Self-pay

## 2022-12-17 DIAGNOSIS — F419 Anxiety disorder, unspecified: Secondary | ICD-10-CM | POA: Insufficient documentation

## 2022-12-17 DIAGNOSIS — F439 Reaction to severe stress, unspecified: Secondary | ICD-10-CM | POA: Diagnosis not present

## 2022-12-17 DIAGNOSIS — Z8619 Personal history of other infectious and parasitic diseases: Secondary | ICD-10-CM | POA: Insufficient documentation

## 2022-12-17 LAB — CBC
HCT: 38.2 % — ABNORMAL LOW (ref 39.0–52.0)
Hemoglobin: 12.5 g/dL — ABNORMAL LOW (ref 13.0–17.0)
MCH: 28.1 pg (ref 26.0–34.0)
MCHC: 32.7 g/dL (ref 30.0–36.0)
MCV: 85.8 fL (ref 80.0–100.0)
Platelets: 208 10*3/uL (ref 150–400)
RBC: 4.45 MIL/uL (ref 4.22–5.81)
RDW: 16.2 % — ABNORMAL HIGH (ref 11.5–15.5)
WBC: 4 10*3/uL (ref 4.0–10.5)
nRBC: 0 % (ref 0.0–0.2)

## 2022-12-17 LAB — URINALYSIS, ROUTINE W REFLEX MICROSCOPIC
Bilirubin Urine: NEGATIVE
Glucose, UA: NEGATIVE mg/dL
Hgb urine dipstick: NEGATIVE
Ketones, ur: NEGATIVE mg/dL
Leukocytes,Ua: NEGATIVE
Nitrite: NEGATIVE
Protein, ur: NEGATIVE mg/dL
Specific Gravity, Urine: 1.004 — ABNORMAL LOW (ref 1.005–1.030)
pH: 7 (ref 5.0–8.0)

## 2022-12-17 LAB — COMPREHENSIVE METABOLIC PANEL
ALT: 34 U/L (ref 0–44)
AST: 23 U/L (ref 15–41)
Albumin: 3.6 g/dL (ref 3.5–5.0)
Alkaline Phosphatase: 47 U/L (ref 38–126)
Anion gap: 11 (ref 5–15)
BUN: 12 mg/dL (ref 8–23)
CO2: 24 mmol/L (ref 22–32)
Calcium: 8.8 mg/dL — ABNORMAL LOW (ref 8.9–10.3)
Chloride: 98 mmol/L (ref 98–111)
Creatinine, Ser: 1.03 mg/dL (ref 0.61–1.24)
GFR, Estimated: >60 mL/min (ref >60)
Glucose, Bld: 108 mg/dL — ABNORMAL HIGH (ref 70–99)
Potassium: 3.6 mmol/L (ref 3.5–5.1)
Sodium: 133 mmol/L — ABNORMAL LOW (ref 135–145)
Total Bilirubin: 0.5 mg/dL (ref 0.3–1.2)
Total Protein: 7.1 g/dL (ref 6.5–8.1)

## 2022-12-17 LAB — LIPASE, BLOOD: Lipase: 41 U/L (ref 11–51)

## 2022-12-17 NOTE — ED Triage Notes (Signed)
Patient reports abdominal and head " tightness" with poor appetite for several days , he stated history of H-pylori .

## 2022-12-18 ENCOUNTER — Encounter (HOSPITAL_COMMUNITY): Payer: Self-pay

## 2022-12-18 MED ORDER — HYDROXYZINE PAMOATE 25 MG PO CAPS: 25.0000 mg | ORAL_CAPSULE | Freq: Three times a day (TID) | ORAL | 0 refills | Status: AC | PRN

## 2022-12-18 MED ORDER — LORAZEPAM 1 MG PO TABS
0.5000 mg | ORAL_TABLET | Freq: Once | ORAL | Status: AC
  Administered 2022-12-18: 0.5 mg via ORAL
  Filled 2022-12-18: qty 1

## 2022-12-18 NOTE — ED Provider Notes (Signed)
[facility] Emergency Department Provider Note MRN:  [id number]  Arrival date & time: 12/18/22     Chief Complaint   Abdominal Tightness/Poor Appetite   History of Present Illness   Kenneth Daniel is a 77 y.o. year-old male presents to the ED with chief complaint of anxious status.  Patient states that he has been under a lot of stress recently.  States that he is preparing to have kidney biopsy done.  States that he was recently treated for H. pylori.  States that he has been losing some weight.  States that he recently started citalopram, but feels like this is not helping him, so he discontinued it.  States that 2 days ago he had a pretty good day, but yesterday he had lack of appetite and was stressed and anxious about all of the events of his life.  He also states that he has had worsening hearing and has been working with new hearing aids.  He denies any new symptoms tonight.  States that he just feels stressed.  History provided by patient.   Review of Systems  Pertinent positive and negative review of systems noted in HPI.    Physical Exam   Vitals:   12/17/22 2157 12/18/22 0301  BP: (!) 165/93 (!) 196/101  Pulse: 81 84  Resp: 16 16  Temp: 98.6 F (37 C) 98.5 F (36.9 C)  SpO2: 96% 100%    CONSTITUTIONAL:  non toxic-appearing, NAD NEURO:  Alert and oriented x 3, CN 3-12 grossly intact EYES:  eyes equal and reactive ENT/NECK:  Supple, no stridor  CARDIO:  normal rate, regular rhythm, appears well-perfused  PULM:  No respiratory distress,  GI/GU:  non-distended, no focal tenderness  MSK/SPINE:  No gross deformities, no edema, moves all extremities  SKIN:  no rash, atraumatic   *Additional and/or pertinent findings included in MDM below  Diagnostic and Interventional Summary    EKG Interpretation Date/Time:    Ventricular Rate:    PR Interval:    QRS Duration:    QT Interval:    QTC Calculation:   R Axis:      Text  Interpretation:         Labs Reviewed  COMPREHENSIVE METABOLIC PANEL - Abnormal; Notable for the following components:      Result Value   Sodium 133 (*)    Glucose, Bld 108 (*)    Calcium 8.8 (*)    All other components within normal limits  CBC - Abnormal; Notable for the following components:   Hemoglobin 12.5 (*)    HCT 38.2 (*)    RDW 16.2 (*)    All other components within normal limits  URINALYSIS, ROUTINE W REFLEX MICROSCOPIC - Abnormal; Notable for the following components:   Color, Urine STRAW (*)    Specific Gravity, Urine 1.004 (*)    All other components within normal limits  LIPASE, BLOOD    No orders to display    Medications  LORazepam (ATIVAN) tablet 0.5 mg (0.5 mg Oral Given 12/18/22 0345)     Procedures  /  Critical Care Procedures  ED Course and Medical Decision Making  I have reviewed the triage vital signs, the nursing notes, and pertinent available records from the EMR.  Social Determinants Affecting Complexity of Care: Patient has no clinically significant social determinants affecting this chief complaint..   ED Course:    Medical Decision Making Patient here after feeling quite anxious.  States that he has been under  a lot of stress at home.  States that he recently started taking citalopram, but this made him feel weird so he discontinued it.  States that his doctors trying to treat his anxiety, but also states that he has been recently treated for H. pylori and needs to have a kidney biopsy.  He states that this has taken a toll on him.  He looks well.  He does seem a bit anxious.  Will give him half milligram of Ativan for his anxiety.  Chart review shows that he has been on hydroxyzine in the past.  Will give him prescription of this.  Have encouraged him to follow-up closely with his doctor.  He appears stable and ready for discharge.  Amount and/or Complexity of Data Reviewed Labs: ordered.  Risk Prescription drug management.          Consultants: No consultations were needed in caring for this patient.   Treatment and Plan: Emergency department workup does not suggest an emergent condition requiring admission or immediate intervention beyond  what has been performed at this time. The patient is safe for discharge and has  been instructed to return immediately for worsening symptoms, change in  symptoms or any other concerns    Final Clinical Impressions(s) / ED Diagnoses     ICD-10-CM   1. Anxiousness  F41.9       ED Discharge Orders          Ordered    hydrOXYzine (VISTARIL) 25 MG capsule  Every 8 hours PRN        12/18/22 0332              Discharge Instructions Discussed with and Provided to Patient:   Discharge Instructions   None      Roxy Horseman, PA-C 12/18/22 0421    Dione Booze, MD 12/18/22 [phone]

## 2022-12-22 DIAGNOSIS — Z974 Presence of external hearing-aid: Secondary | ICD-10-CM | POA: Diagnosis not present

## 2022-12-22 DIAGNOSIS — H903 Sensorineural hearing loss, bilateral: Secondary | ICD-10-CM | POA: Diagnosis not present

## 2022-12-22 DIAGNOSIS — H938X3 Other specified disorders of ear, bilateral: Secondary | ICD-10-CM | POA: Diagnosis not present

## 2022-12-24 DIAGNOSIS — R112 Nausea with vomiting, unspecified: Secondary | ICD-10-CM | POA: Diagnosis not present

## 2022-12-24 DIAGNOSIS — F32A Depression, unspecified: Secondary | ICD-10-CM | POA: Diagnosis not present

## 2022-12-25 ENCOUNTER — Ambulatory Visit (INDEPENDENT_AMBULATORY_CARE_PROVIDER_SITE_OTHER): Payer: Medicare PPO | Admitting: *Deleted

## 2022-12-25 DIAGNOSIS — J309 Allergic rhinitis, unspecified: Secondary | ICD-10-CM | POA: Diagnosis not present

## 2022-12-28 ENCOUNTER — Ambulatory Visit: Payer: Medicare PPO | Admitting: Licensed Clinical Social Worker

## 2022-12-28 DIAGNOSIS — F4323 Adjustment disorder with mixed anxiety and depressed mood: Secondary | ICD-10-CM | POA: Diagnosis not present

## 2022-12-28 NOTE — Progress Notes (Signed)
[facility] Counselor/Therapist Progress Note  Patient ID: Kenneth Daniel, MRN: [id number]    Date: 12/28/22  Time Spent: 0300 pm - 0400 pm : 60 Minutes  Treatment Type: Individual Therapy/Assessment/TX PLAN  Reported Symptoms: Symptoms of depression and anxiety, pt reports loss of interest and a fear of flying.  Mental Status Exam: Appearance:  Well Groomed     Behavior: Appropriate  Motor: Normal  Speech/Language:  Clear and Coherent  Affect: Flat  Mood: depressed  Thought process: normal  Thought content:   WNL  Sensory/Perceptual disturbances:   WNL  Orientation: oriented to person, place, time/date, situation, day of week, month of year, and year  Attention: Good  Concentration: Good  Memory: WNL  Fund of knowledge:  Good  Insight:   Good  Judgment:  Good  Impulse Control: Good   Risk Assessment: Danger to Self:  No Self-injurious Behavior: No Danger to Others: No Duty to Warn:no Physical Aggression / Violence:No  Access to Firearms a concern: No  Gang Involvement:No   Subjective:   Kenneth Daniel participated from office located at [facility] with Clinician present. Kenneth Daniel consented to treatment.    Interventions: Cognitive Behavioral Therapy, Dialectical Behavioral Therapy, and Motivational Interviewing  Diagnosis: Adjustment Disorder with mixed anxiety and depression.  Presenting Problem Patient reports stress from over doing things, patient reports that his father passed in November.  Patient relocated to [facility] from [place] and left his friends behind. Patient reports in July he was diagnosed with H pylori and acid reflux but continues to have issues even after seeking treatment. Patient reports that due to illness he has lost confidence and hasn't felt like traveling and going to the gym. Patient reports that he had been flying and traveling but now has a fear of flying. Chief Complaint: Depression and anxiety related to recent  illness  What are the main stressors in your life right now, how long? Depression  3, Anxiety   3, Appetite Change   3, Sleep Changes   3, Racing Thoughts   3, Memory Problems   3, Loss of Interest   3, Excessive Worrying   3, and Low Energy   3   Previous mental health services Have you ever been treated for a mental health problem, when, where, by whom? Yes  [facility]-last went in January, patient reports that he had therapy before but didn't explain why. Patient was hyper focused on his current condition and was difficult to redirect.    Are you currently seeing a therapist or counselor, counselor's name? No NA  Have you ever had a mental health hospitalization, how many times, length of stay? No NA  Have you ever been treated with medication, name, reason, response? Yes Hydroxyzine for Anxiety  Have you ever had suicidal thoughts or attempted suicide, when, how? No NA  Risk factors for Suicide Demographic factors:  Male, Age 70 or older, Living alone, and Unemployed Current mental status: No plan to harm self or others Loss factors: Decrease in vocational status, Loss of significant relationship, and Decline in physical health Historical factors: NA Risk Reduction factors: Positive social support Clinical factors:  Severe Anxiety and/or Agitation Depression:   NA Cognitive features that contribute to risk: NA    SUICIDE RISK:  Minimal: No identifiable suicidal ideation.  Patients presenting with no risk factors but with morbid ruminations; may be classified as minimal risk based on the severity of the depressive symptoms Medical treatment and/or problems, explain: Yes PT reports  recent medical issues with his stomach and unable to find relief even after being treated for acid reflux and H Pylori. Do you have any issues with chronic pain?  No NA Name of primary care physician/last physical exam: Vita Bland last appointment a few weeks ago.  Allergies: No Medication, reactions?  NA   Current medications:  busPIRone HCl       busPIRone (BUSPAR) 5 mg tablet New  1 TABLET TWICE A DAY AS NEEDED ANXIETY ORALLY  09/04/2019    [facility]: [facility]    Citalopram Hydrobromide       CITALOPRAM HBR 10 MG TABLET New Add as: citalopram (CELEXA) 10 MG tablet  SMARTSIG:By Mouth      Source: External PharmacyUpdated on: 12/14/2022   Months of dispense information: 1; Number of dispenses: 1 Dispense date: 12/14/2022 Qty: 40 each [id number] - [place], [place] - [address] AT [address] OF [facility] [address] [phone]  Quinapril HCl       quinapriL (ACCUPRIL) 40 mg tablet New  TAKE 1 TABLET BY MOUTH EVERY DAY ONCE A DAY ORALLY 30 DAY(S)  10/18/2019    [facility]: 07/28/2022    ALPRAZolam       ALPRAZolam (XANAX) 0.25 MG tablet On Chart Dose: 0.25 mg Take 0.25 mg by mouth daily as needed. 12/26/2021    Source: [facility] on: 11/21/2022   Months of dispense information: 6; Number of dispenses: 1 Dispense date: 07/09/2022 Qty: 15 each [id number] - [place], [place] - [address] AT [address] OF [facility] [address] [phone]     ALPRAZolam (XANAX) 0.25 mg tablet Similar Dose: 0.25 mg Take 0.25 mg by mouth.  12/26/2021    [facility]: 12/22/2022    Pantoprazole Sodium       pantoprazole (PROTONIX) 20 MG tablet On Chart (Expired) Dose: 20 mg Take 1 tablet (20 mg total) by mouth daily for 14 days. 11/20/2022 12/04/2022   Source: [facility] on: 11/20/2022   Months of dispense information: 2; Number of dispenses: 1 Dispense date: 11/20/2022 Qty: 14 each [id number] - [place], [place] - [address] AT [address] OF [facility] [address] [phone]     pantoprazole (PROTONIX) 20 mg EC tablet Similar Add as: pantoprazole (PROTONIX) 20 MG tablet Dose: 20 mg Take 20 mg by mouth.  11/20/2022    [facility] on: 12/22/2022    Acetaminophen       acetaminophen (TYLENOL) 500 MG tablet On Chart Dose: 1,000 mg Take 1,000 mg by mouth every 8 (eight) hours as needed for moderate pain.     Source: [facility] on: 11/20/2022    Alum Hydroxide-Mag Carbonate       aluminum hydroxide-magnesium carbonate (GAVISCON) 95-358 MG/15ML SUSP On Chart Dose: 30 mL Take 30 mLs by mouth as needed for indigestion or heartburn.     Source: [facility] RecordUpdated on: 11/20/2022    amLODIPine Besylate       amLODipine (NORVASC) 10 MG tablet On Chart Dose: 10 mg Take 10 mg by mouth daily.     Source: [facility] RecordUpdated on: 11/21/2022   Months of dispense information: 4; Number of dispenses: 2 Dispense date: 12/21/2022 Qty: 90 tablet Unit strength: 10 MG [id number] - [place], [place] - [address] AT [address] OF [facility] [address] [phone]  Ascorbic Acid       ascorbic acid (VITAMIN C) 500 MG tablet On Chart  Dose: 500 mg Take 500 mg by mouth daily.     Source: [facility]Updated on: 11/21/2022    Aspirin       aspirin 81 MG EC tablet On Chart Dose: 81 mg Take 81 mg by mouth at bedtime.     Source: [facility] RecordUpdated on: 11/21/2022    Atorvastatin Calcium       atorvastatin (LIPITOR) 40 MG tablet On Chart Dose: 40 mg Take 40 mg by mouth at bedtime.     Source: [facility] RecordUpdated on: 11/21/2022   Months of dispense information: 5; Number of dispenses: 2 Dispense date: 11/13/2022 Qty: 90 each [id number] - [place], [place] - [address] AT [address] OF [facility] [address] [phone]  Azelastine HCl       Azelastine HCl 137 MCG/SPRAY SOLN On Chart  PLACE 2 SPRAYS INTO BOTH NOSTRILS 2 (TWO) TIMES DAILY 11/13/2022    Source: [facility] RecordUpdated on: 11/21/2022   Months of dispense information: 6; Number of dispenses: 5 Dispense date: 11/10/2022 Qty: 90 mL [id number] -  [place], [place] - [address] AT [address] OF [facility] [address] [phone]  B Complex Vitamins       b complex vitamins tablet On Chart Dose: 1 tablet Take 1 tablet by mouth daily as needed.     Source: [facility] RecordUpdated on: 11/20/2022    Cholecalciferol       cholecalciferol (VITAMIN D3) 25 MCG (1000 UNIT) tablet On Chart Dose: 1,000 Units Take 1,000 Units by mouth daily.     Source: Local [facility] RecordUpdated on: 11/21/2022    Coenzyme Q10-levOCARNitine       Coenzyme Q10-levOCARNitine (CO Q-10 PLUS PO) On Chart Dose: 1 tablet Take 1 tablet by mouth daily at 12 noon.     Source: Local [facility] RecordUpdated on: 11/20/2022    Desloratadine       desloratadine (CLARINEX) 5 MG tablet On Chart  TAKE 1 TABLET BY MOUTH 2 TIMES DAILY AS NEEDED. 11/13/2022    Source: Local Medical RecordUpdated on: 11/21/2022   Months of dispense information: 3; Number of dispenses: 1 Dispense date: 10/22/2022 Qty: 60 each [id number] - [place], [place] - [address] AT [address] OF [facility] [address] [phone]  Diclofenac Sodium       diclofenac Sodium (VOLTAREN) 1 % GEL On Chart Dose: 4 g Apply 4 g topically 4 (four) times daily. 07/03/2021    Source: Local Medical RecordUpdated on: 11/20/2022    Digestive Enzymes       Digestive Enzymes CAPS On Chart  Take one 20 minutes before meals 11/09/2022    Source: Local [facility] RecordUpdated on: 11/20/2022    Echinacea       ECHINACEA EXTRACT PO On Chart Dose: 1 capsule Take 1 capsule by mouth daily as needed (cold symptoms).     Source: Local Medical RecordUpdated on: 11/20/2022    Enzalutamide       XTANDI 40 MG tablet On Chart Dose: 80 mg Take 80 mg by mouth 2 (two) times daily. 10/19/2022    Source: Local [facility] RecordUpdated on: 11/20/2022   Months of dispense information: 4; Number of dispenses: 4 Dispense date: 12/07/2022 Qty: 120 tablet Unit strength: 40 MG Pharmacy: [facility] -  [place], [place] - [address] [place] [phone]  EPINEPHrine       EPINEPHrine 0.3 mg/0.3 mL IJ SOAJ injection On Chart  Use as directed for life threatening allergic reactions 11/10/2022  Source: Local [facility] RecordUpdated on: 11/20/2022   Months of dispense information: 2; Number of dispenses: 1 Dispense date: 11/10/2022 Qty: 2 each [id number] - [place], [place] - [address] AT [address] OF [facility] [address] [phone]  Ergocalciferol       Vitamin D, Ergocalciferol, (DRISDOL) 1.25 MG (50000 UNIT) CAPS capsule On Chart Dose: 50,000 Units Take 50,000 Units by mouth once a week. 09/25/2022    Source: Local Medical RecordUpdated on: 11/20/2022   Months of dispense information: 6; Number of dispenses: 3 Dispense date: 12/26/2022 Qty: 12 capsule Unit strength: 1250 MCG [id number] - [place], [place] - [address] AT [address] OF [facility] [address] [phone]  Esomeprazole Magnesium       esomeprazole (NEXIUM) 40 MG capsule On Chart Dose: 40 mg Take 1 capsule (40 mg total) by mouth daily. 11/09/2022    Source: Local Medical RecordUpdated on: 11/20/2022   Months of dispense information: 2; Number of dispenses: 1 Dispense date: 11/09/2022 Qty: 14 each [id number] - [place], [place] - [address] AT [address] OF [facility] [address] [phone]  Famotidine       famotidine (PEPCID) 40 MG tablet On Chart Dose: 40 mg Take 40 mg by mouth daily.     Source: Local Medical RecordUpdated on: 11/20/2022   Months of dispense information: 4; Number of dispenses: 2 Dispense date: 12/14/2022 Qty: 90 each [id number] - [place], [place] - [address] AT [address] OF [facility] [address] [phone]  Fluticasone Propionate       fluticasone (FLONASE) 50 MCG/ACT nasal spray On Chart Dose: 2 spray Place 2 sprays into both nostrils daily. 11/10/2022    Source: Local Medical RecordUpdated on: 11/20/2022   Months of  dispense information: 2; Number of dispenses: 1 Dispense date: 11/10/2022 Qty: 48 g [id number] - [place], [place] - [address] AT [address] OF [facility] [address] [phone]  hydrOXYzine Pamoate       hydrOXYzine (VISTARIL) 25 MG capsule On Chart Dose: 25 mg Take 1 capsule (25 mg total) by mouth every 8 (eight) hours as needed for anxiety. 12/18/2022    Source: Local Medical RecordUpdated on: 12/18/2022   Months of dispense information: 1; Number of dispenses: 1 Dispense date: 12/18/2022 Qty: 30 each [id number] - [place], [place] - [address] AT [address] OF [facility] [address] [phone]  Magnesium       Magnesium 250 MG TABS On Chart Dose: 250 mg Take 250 mg by mouth at bedtime.     Source: Local Medical RecordUpdated on: 11/20/2022    Megestrol Acetate       megestrol (MEGACE) 20 MG tablet On Chart Dose: 20 mg Take 20 mg by mouth daily. 10/28/2022    Source: Local Medical RecordUpdated on: 11/20/2022   Months of dispense information: 3; Number of dispenses: 1 Dispense date: 10/28/2022 Qty: 90 each [id number] - [place], [place] - [address] AT [address] OF [facility] [address] [phone]  Melatonin       Melatonin 5 MG CAPS On Chart Dose: 10 mg Take 10 mg by mouth at bedtime.     Source: Local Medical RecordUpdated on: 11/20/2022    Nebivolol HCl       nebivolol (BYSTOLIC) 10 MG tablet On Chart  TAKE 1 TABLET BY MOUTH EVERY DAY IN THE MORNING 10/22/2022    Source: Local Medical RecordUpdated on: 11/20/2022   Months of dispense information: 4; Number of dispenses: 2 Dispense  date: 12/12/2022 Qty: 90 each [id number] - [place], [place] - [address] AT [address] OF [facility] [address] [phone]  Nutritional Supplements       Nutritional Supplements (JUICE PLUS FIBRE PO) On Chart Dose: 2 capsule Take 2 capsules by mouth daily.     Source: Local Medical RecordUpdated on: 11/20/2022     Polyethylene Glycol 3350       Polyethylene Glycol 3350 (MIRALAX PO) On Chart  Take by mouth daily.     Source: Local Medical RecordUpdated on: 11/20/2022    Probiotic Product       Probiotic Product (PROBIOTIC DAILY PO) On Chart Dose: 1 capsule Take 1 capsule by mouth daily.     Source: Local Medical RecordUpdated on: 11/21/2022    Sucralfate       sucralfate (CARAFATE) 1 GM/10ML suspension On Chart  SMARTSIG:2 Teaspoon By Mouth 4 Times Daily 11/10/2022    Source: Local Medical RecordUpdated on: 11/20/2022   Months of dispense information: 2; Number of dispenses: 1 Dispense date: 11/10/2022 Qty: 1000 mL [id number] - [place], [place] - [address] AT [address] OF [facility] [address] [phone]  Triamcinolone Acetonide       triamcinolone cream (KENALOG) 0.1 % On Chart Dose: 1 application Apply 1 application. topically 2 (two) times daily. 09/02/2021    Source: Local Medical RecordUpdated on: 11/20/2022    Valsartan-hydroCHLOROthiazide       valsartan-hydrochlorothiazide (DIOVAN-HCT) 320-12.5 MG tablet On Chart Dose: 1 tablet Take 1 tablet by mouth daily. 02/21/2020    Source: Local Medical RecordUpdated on: 11/21/2022   Months of dispense information: 5; Number of dispenses: 2 Dispense date: 10/27/2022 Qty: 90 each [id number] - [place]  - [address] AT [address] OF [facility] [address] [phone]  Zinc Gluconate       zinc gluconate 50 MG tablet On Chart Dose: 50 mg Take 50 mg by mouth daily as needed (supplement).     Source: Local Medical RecordUpdated on: 11/20/2022     Prescribed by: Ocie Bob Is there any history of mental health problems or substance abuse in your family, whom? No NA Has anyone in your family been hospitalized, who, where, length of stay? No NA  Social/family history Have you been married, how many times?  0  Do you have children?  0  How many pregnancies have you had?  0  Who lives in your  current household? Patient lives alone  Military history: No NA  Religious/spiritual involvement: [employer] What religion/faith base are you? Baptist  Family of origin (childhood history) Grandparents and patient and sister Where were you born[place] Where did you grow up[place] How many different homes have you lived? 10 Describe the atmosphere of the household where you grew up: "HAPPY, CLOSE" Do you have siblings, step/half siblings, list names, relation, sex, age? Yes Bio Sister-Betty 75, Half SiblingsBurna Mortimer -67, Clyde-58, Karen-62, Robin-55.  Are your parents separated/divorced, when and why? Parents never married   Are your parents alive? No   Social supports (personal and professional): Sister, Kathie Rhodes  Education How many grades have you completed? post college graduate work or degree Did you have any problems in school, what type? No NA Medications prescribed for these problems? No   Employment (financial issues) NA  Legal history:NA   Trauma/Abuse history: Have you ever been exposed to any form of abuse, what type? NA   Have you ever been exposed to something traumatic, describe?  No   Substance use Do you use Caffeine? No Type, frequency? NA  Do you use Nicotine? No Type, frequency, ppd? NA   Do you use Alcohol? No Type, frequency? NA  How old were you went you first tasted alcohol? NA Was this accepted by your family? NA  When was your last drink, type, how much? NA  Have you ever used illicit drugs or taken more than prescribed, type, frequency, date of last usage? No NA    Diagnosis AXIS I Adjustment Disorder with Mixed Emotional Features  AXIS II No diagnosis  AXIS III @PMH @  AXIS IV other psychosocial or environmental problems and problems with access to health care services  AXIS V 61-70 mild symptoms   Plan: Patient reports stress from over doing things, patient reports that his father passed in November.  Patient relocated  to [facility] [place] and left his friends behind. Patient reports in July he was diagnosed with H pylori and acid reflux but continues to have issues even after seeking treatment. Patient reports that due to illness he has lost confidence and hasn't felt like traveling and going to the gym. Patient reports that he had been flying and traveling but now has a fear of flying.   Client Abilities/Strengths: Active in [place] and his community in [place]. Patient takes pride in his work in [place]. Support System: Sister-Betty   Client Treatment Preferences Cognitive Behavioral Therapy  Client Statement of Needs       "I want to get my confidence back and also not be afraid to fly." Treatment Level Every other week/MODERATE  Symptoms: Depression and anxiety,   Goals:"I want to get my confidence back and also not be afraid to fly."  Target Date: 12/28/2023 Frequency: biweekly  Progress: 0 Modality: individual    Therapist will provide referrals for additional resources as appropriate.  Therapist will provide psycho-education regarding anxiety and depression related to his diagnosis and corresponding treatment approaches and interventions. Licensed Clinical Social Worker, Phyllis Ginger, LCSW will support the patient's ability to achieve the goals identified. will employ CBT, BA, Problem-solving, Solution Focused, Mindfulness,  coping skills, & other evidenced-based practices will be used to promote progress towards healthy functioning to help manage decrease symptoms associated with his diagnosis.   Reduce overall level, frequency, and intensity of the feelings of depression, anxiety and panic evidenced by decreased from 6 to 7 days/week to 0 to 1 days/week per client report for at least 3 consecutive months. Verbally express understanding of the relationship between feelings of depression, anxiety and their impact on thinking patterns and behaviors. Verbalize an understanding of the role that distorted  thinking plays in creating fears, excessive worry, and ruminations.           Kenneth Daniel participated in the creation of the treatment plan.  Phyllis Ginger MSW, LCSW DATE:12/28/2022          Plan: Kenneth Daniel  is to use CBT, mindfulness and coping skills to help manage decrease symptoms associated with their diagnosis.   Long-term goal:   Kenneth Daniel  will reduce overall level, frequency, and intensity of the feelings of depression, anxiety and panic evidenced by decreased irritability, negative self talk, and helpless feelings from 6 to 7 days/week to 0 to 2 days/week per client report for at least 3 consecutive months.  Short-term goal:  Kenneth Daniel will verbally express understanding of the relationship between feelings of depression, anxiety and their impact on thinking patterns and behaviors. Verbalize an understanding of the role  that distorted thinking plays in creating fears, excessive worry, and ruminations.  Phyllis Ginger MSW, LCSW DATE:12/28/2022

## 2022-12-29 ENCOUNTER — Ambulatory Visit: Payer: Medicare PPO | Admitting: Sports Medicine

## 2022-12-29 ENCOUNTER — Encounter: Payer: Self-pay | Admitting: Sports Medicine

## 2022-12-29 VITALS — BP 138/78 | HR 77 | Temp 97.7°F | Resp 16 | Ht 74.0 in | Wt 167.0 lb

## 2022-12-29 DIAGNOSIS — R634 Abnormal weight loss: Secondary | ICD-10-CM

## 2022-12-29 DIAGNOSIS — K219 Gastro-esophageal reflux disease without esophagitis: Secondary | ICD-10-CM

## 2022-12-29 DIAGNOSIS — Z131 Encounter for screening for diabetes mellitus: Secondary | ICD-10-CM

## 2022-12-29 DIAGNOSIS — H903 Sensorineural hearing loss, bilateral: Secondary | ICD-10-CM | POA: Diagnosis not present

## 2022-12-29 MED ORDER — OMEPRAZOLE 40 MG PO CPDR: 40.0000 mg | DELAYED_RELEASE_CAPSULE | Freq: Two times a day (BID) | ORAL | 3 refills | Status: DC

## 2022-12-29 MED ORDER — SUCRALFATE 1 G PO TABS: 1.0000 g | ORAL_TABLET | Freq: Three times a day (TID) | ORAL | 2 refills | Status: DC

## 2022-12-29 NOTE — Progress Notes (Signed)
Careteam: Patient Care Team: Venita Sheffield, MD as PCP - General (Internal Medicine)  PLACE OF SERVICE:  [facility]  Advanced Directive information Does Patient Have a Medical Advance Directive?: Yes, Type of Advance Directive: Healthcare Power of [place];Living will;Out of facility DNR (pink MOST or yellow form), Does patient want to make changes to medical advance directive?: No - Patient declined  No Known Allergies  Chief Complaint  Patient presents with   New Patient (Initial Visit)    Patient is here to establish care. Patient states he is losing weight      HPI: Patient is a 77 y.o. male is here to establish care      Weight loss x 2 months  Lost about 20 pounds in the last 2 months Has good appetite C/o acid  reflux  Tries to eat 4-5 times/ day  States hungry but cannot eat lot of foods  Denies abdominal pain, bloody or dark stools. Follows [facility] , has appt with them  next month  Denies fevers, night sweats , cough , SOB    Allergic rhinitis Follows with allergy clinic and receives a shot every week       Review of Systems:  Review of Systems  Constitutional:  Positive for weight loss. Negative for chills and fever.  HENT:  Negative for congestion and sore throat.   Eyes:  Negative for double vision.  Respiratory:  Negative for cough, sputum production and shortness of breath.   Cardiovascular:  Negative for chest pain, palpitations and leg swelling.  Gastrointestinal:  Positive for heartburn. Negative for abdominal pain and nausea.  Genitourinary:  Negative for dysuria, frequency and hematuria.  Musculoskeletal:  Negative for falls and myalgias.  Neurological:  Negative for dizziness, sensory change and focal weakness.   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, right    per last duplex in epic 06-04-2020  right ICA 16-49% and right ECA >50%   Chronic  constipation    GERD (gastroesophageal reflux disease)    History of acute pyelonephritis 09/2020   w/ admission in epic due to severe sepsis   History of COVID-19 11/18/2020   positive result in epic;   per pt mild to moderate symptoms that resolved   History of external beam radiation therapy    completed in 2015 for recurrent prostate cancer (done in [facility])   History of prostate cancer    (current urologist-- dr Benancio Deeds, and current treatment lupron injeciton q 6months) ;   per pt in [place], [place] 2004  s/p radical prostatectomy;   recurrence 2015 completed radiation   HLD (hyperlipidemia)    Hypertension    followed by pcp and cardiology---   (05-29-2020  nuclear stress test in epic , low risk no ischemia nuclear ef 63%)   Inguinal hernia, right    Non-rheumatic aortic regurgitation    mild to moderate AR without stenosis per last echo in epic 02-27-2021   Perennial allergic rhinitis    [facility]   Wears dentures    upper   Wears glasses    Wears hearing aid in both ears    Past Surgical History:  Procedure Laterality Date   ANTERIOR CERVICAL DECOMP/DISCECTOMY FUSION N/A 11/09/2019   Procedure: ANTERIOR CERVICAL DECOMPRESSION FUSION CERVICAL 3-4 WITH INSTRUMENTATION AND ALLOGRAFT;  Surgeon: Estill Bamberg, MD;  Location: [facility] OR;  Service: Orthopedics;  Laterality:  N/A;   CARDIAC CATHETERIZATION  2002   in [place], [place];   per pt stress test with possible ischemia,  told arteries normal   COLONOSCOPY  2020   CYST EXCISION  2005   per pt from back , was benign   INGUINAL HERNIA REPAIR Right 06/04/2021   Procedure: OPEN RIGHT INGUINAL HERNIA REPAIR;  Surgeon: Berna Bue, MD;  Location: [facility];  Service: General;  Laterality: Right;   PROSTATECTOMY  2004   in [place], [place]   UPPER GI ENDOSCOPY  2021   Social History:   reports that he quit smoking about 51 years ago. His smoking use included cigarettes. He started smoking  about 55 years ago. He has never used smokeless tobacco. He reports that he does not drink alcohol and does not use drugs.  Family History  Problem Relation Age of Onset   Pancreatic cancer Mother    Prostate cancer Father     Medications: Patient's Medications  New Prescriptions   No medications on file  Previous Medications   ACETAMINOPHEN (TYLENOL) 500 MG TABLET    Take 1,000 mg by mouth every 8 (eight) hours as needed for moderate pain.   ALPRAZOLAM (XANAX) 0.25 MG TABLET    Take 0.25 mg by mouth daily as needed.   ALUMINUM HYDROXIDE-MAGNESIUM CARBONATE (GAVISCON) 95-358 MG/15ML SUSP    Take 30 mLs by mouth as needed for indigestion or heartburn.   AMLODIPINE (NORVASC) 10 MG TABLET    Take 10 mg by mouth daily.   ASCORBIC ACID (VITAMIN C) 500 MG TABLET    Take 500 mg by mouth daily.   ASPIRIN 81 MG EC TABLET    Take 81 mg by mouth at bedtime.   ATORVASTATIN (LIPITOR) 40 MG TABLET    Take 40 mg by mouth at bedtime.   AZELASTINE HCL 137 MCG/SPRAY SOLN    PLACE 2 SPRAYS INTO BOTH NOSTRILS 2 (TWO) TIMES DAILY   B COMPLEX VITAMINS TABLET    Take 1 tablet by mouth daily as needed.   CHOLECALCIFEROL (VITAMIN D3) 25 MCG (1000 UNIT) TABLET    Take 1,000 Units by mouth daily.   COENZYME Q10-LEVOCARNITINE (CO Q-10 PLUS PO)    Take 1 tablet by mouth daily at 12 noon.   DESLORATADINE (CLARINEX) 5 MG TABLET    TAKE 1 TABLET BY MOUTH 2 TIMES DAILY AS NEEDED.   DICLOFENAC SODIUM (VOLTAREN) 1 % GEL    Apply 4 g topically 4 (four) times daily.   DIGESTIVE ENZYMES CAPS    Take one 20 minutes before meals   ECHINACEA EXTRACT PO    Take 1 capsule by mouth daily as needed (cold symptoms).   EPINEPHRINE 0.3 MG/0.3 ML IJ SOAJ INJECTION    Use as directed for life threatening allergic reactions   ESOMEPRAZOLE (NEXIUM) 40 MG CAPSULE    Take 1 capsule (40 mg total) by mouth daily.   FAMOTIDINE (PEPCID) 40 MG TABLET    Take 40 mg by mouth daily.   FLUTICASONE (FLONASE) 50 MCG/ACT NASAL SPRAY    Place 2 sprays  into both nostrils daily.   HYDROXYZINE (VISTARIL) 25 MG CAPSULE    Take 1 capsule (25 mg total) by mouth every 8 (eight) hours as needed for anxiety.   MAGNESIUM 250 MG TABS    Take 250 mg by mouth at bedtime.   MEGESTROL (MEGACE) 20 MG TABLET    Take 20 mg by mouth daily.   MELATONIN 5 MG CAPS  Take 10 mg by mouth at bedtime.   NEBIVOLOL (BYSTOLIC) 10 MG TABLET    TAKE 1 TABLET BY MOUTH EVERY DAY IN THE MORNING   NUTRITIONAL SUPPLEMENTS (JUICE PLUS FIBRE PO)    Take 2 capsules by mouth daily.   PANTOPRAZOLE (PROTONIX) 20 MG TABLET    Take 1 tablet (20 mg total) by mouth daily for 14 days.   POLYETHYLENE GLYCOL 3350 (MIRALAX PO)    Take by mouth daily.   PROBIOTIC PRODUCT (PROBIOTIC DAILY PO)    Take 1 capsule by mouth daily.   SUCRALFATE (CARAFATE) 1 GM/10ML SUSPENSION    SMARTSIG:2 Teaspoon By Mouth 4 Times Daily   TRIAMCINOLONE CREAM (KENALOG) 0.1 %    Apply 1 application. topically 2 (two) times daily.   VALSARTAN-HYDROCHLOROTHIAZIDE (DIOVAN-HCT) 320-12.5 MG TABLET    Take 1 tablet by mouth daily.   VITAMIN D, ERGOCALCIFEROL, (DRISDOL) 1.25 MG (50000 UNIT) CAPS CAPSULE    Take 50,000 Units by mouth once a week.   XTANDI 40 MG TABLET    Take 80 mg by mouth 2 (two) times daily.   ZINC GLUCONATE 50 MG TABLET    Take 50 mg by mouth daily as needed (supplement).  Modified Medications   No medications on file  Discontinued Medications   No medications on file    Physical Exam:  Vitals:   12/29/22 1004  Pulse: 77  Resp: 16  Temp: 97.7 F (36.5 C)  TempSrc: Temporal  SpO2: 98%  Weight: 167 lb (75.8 kg)  Height: 6\' 2"  (1.88 m)   Body mass index is 21.44 kg/m. Wt Readings from Last 3 Encounters:  12/29/22 167 lb (75.8 kg)  12/18/22 178 lb 9.2 oz (81 kg)  11/27/22 178 lb 9.2 oz (81 kg)    Physical Exam Constitutional:      Appearance: Normal appearance.  Cardiovascular:     Rate and Rhythm: Normal rate and regular rhythm.     Pulses: Normal pulses.     Heart sounds:  Normal heart sounds. No murmur heard. Abdominal:     General: There is no distension.     Palpations: There is no mass.     Tenderness: There is no abdominal tenderness.  Musculoskeletal:        General: No swelling.  Neurological:     Mental Status: He is alert. Mental status is at baseline.     Sensory: No sensory deficit.     Motor: No weakness.      Labs reviewed: Basic Metabolic Panel: Recent Labs    11/27/22 1318 12/17/22 2204  NA 136 133*  K 3.8 3.6  CL 99 98  CO2 26 24  GLUCOSE 94 108*  BUN 13 12  CREATININE 1.03 1.03  CALCIUM 9.4 8.8*   Liver Function Tests: Recent Labs    11/27/22 1318 12/17/22 2204  AST 27 23  ALT 30 34  ALKPHOS 50 47  BILITOT 0.8 0.5  PROT 7.4 7.1  ALBUMIN 3.7 3.6   Recent Labs    11/27/22 1318 12/17/22 2204  LIPASE 44 41   No results for input(s): "AMMONIA" in the last 8760 hours. CBC: Recent Labs    11/27/22 1318 12/17/22 2204  WBC 2.6* 4.0  NEUTROABS 1.6*  --   HGB 12.9* 12.5*  HCT 39.3 38.2*  MCV 84.9 85.8  PLT 187 208   Lipid Panel: No results for input(s): "CHOL", "HDL", "LDLCALC", "TRIG", "CHOLHDL", "LDLDIRECT" in the last 8760 hours. TSH: No results for input(s): "TSH" in  the last 8760 hours. A1C: No results found for: "HGBA1C"   Assessment/Plan 1. Gastroesophageal reflux disease, unspecified whether esophagitis present Pt reported that he was treated for H pylori 3 weeks ago  Will increase prilosec to twice daily  Will add sucralfate  - CBC With Differential/Platelet - H. pylori breath test - omeprazole (PRILOSEC) 40 MG capsule; Take 1 capsule (40 mg total) by mouth in the morning and at bedtime.  Dispense: 180 capsule; Refill: 3 - sucralfate (CARAFATE) 1 g tablet; Take 1 tablet (1 g total) by mouth 3 (three) times daily.  Dispense: 90 tablet; Refill: 2      2. Weight loss Instructed patient to follow up with GI  Will check labs - CBC With Differential/Platelet - TSH  3. Screening for diabetes  mellitus - Hemoglobin A1C      I spent greater than  43 minutes for the care of this patient in face to face time, chart review, clinical documentation, patient education.

## 2022-12-29 NOTE — Patient Instructions (Signed)
Need records from previous PCP

## 2022-12-30 ENCOUNTER — Telehealth: Payer: Self-pay

## 2022-12-30 NOTE — Telephone Encounter (Signed)
Venita Sheffield, MD  You1 hour ago (3:12 PM)    Medication should not be causing nausea, he has acid reflux which is probably the reason for nausea, he needs to follow up with GI for EGD.

## 2022-12-30 NOTE — Telephone Encounter (Signed)
Patient states that he was given 2 medication on his visit yesterday. He states that he felt nauseous after taking the medications. He wants to know if he just needs time for medication to get in his system or is this normal.  Message sent to Dr. Jacquenette Shone

## 2022-12-30 NOTE — Telephone Encounter (Signed)
I spoke with patient and response from Dr. Jacquenette Shone discussed. Patient verbalized his understanding and agreed.

## 2022-12-31 ENCOUNTER — Other Ambulatory Visit: Payer: Self-pay

## 2022-12-31 ENCOUNTER — Telehealth: Payer: Self-pay

## 2022-12-31 ENCOUNTER — Other Ambulatory Visit: Payer: Self-pay | Admitting: Sports Medicine

## 2022-12-31 ENCOUNTER — Other Ambulatory Visit: Payer: Self-pay | Admitting: Cardiology

## 2022-12-31 ENCOUNTER — Encounter (HOSPITAL_COMMUNITY): Payer: Self-pay | Admitting: Emergency Medicine

## 2022-12-31 ENCOUNTER — Emergency Department (HOSPITAL_COMMUNITY)
Admission: EM | Admit: 2022-12-31 | Discharge: 2023-01-01 | Disposition: A | Discharge status: Home / Self Care | Payer: Medicare PPO | Attending: Emergency Medicine | Admitting: Emergency Medicine

## 2022-12-31 ENCOUNTER — Encounter (HOSPITAL_COMMUNITY): Payer: Self-pay

## 2022-12-31 ENCOUNTER — Ambulatory Visit (HOSPITAL_COMMUNITY)
Admission: EM | Admit: 2022-12-31 | Discharge: 2022-12-31 | Disposition: A | Discharge status: Home / Self Care | Payer: Medicare PPO | Attending: Family Medicine | Admitting: Family Medicine

## 2022-12-31 DIAGNOSIS — Z7982 Long term (current) use of aspirin: Secondary | ICD-10-CM | POA: Insufficient documentation

## 2022-12-31 DIAGNOSIS — K219 Gastro-esophageal reflux disease without esophagitis: Secondary | ICD-10-CM | POA: Insufficient documentation

## 2022-12-31 DIAGNOSIS — I10 Essential (primary) hypertension: Secondary | ICD-10-CM

## 2022-12-31 DIAGNOSIS — R5383 Other fatigue: Secondary | ICD-10-CM

## 2022-12-31 DIAGNOSIS — R1310 Dysphagia, unspecified: Secondary | ICD-10-CM | POA: Diagnosis not present

## 2022-12-31 DIAGNOSIS — R110 Nausea: Secondary | ICD-10-CM | POA: Diagnosis not present

## 2022-12-31 LAB — CBC
HCT: 38.3 % — ABNORMAL LOW (ref 39.0–52.0)
Hemoglobin: 12.5 g/dL — ABNORMAL LOW (ref 13.0–17.0)
MCH: 27.8 pg (ref 26.0–34.0)
MCHC: 32.6 g/dL (ref 30.0–36.0)
MCV: 85.1 fL (ref 80.0–100.0)
Platelets: 218 10*3/uL (ref 150–400)
RBC: 4.5 MIL/uL (ref 4.22–5.81)
RDW: 16.2 % — ABNORMAL HIGH (ref 11.5–15.5)
WBC: 3.2 10*3/uL — ABNORMAL LOW (ref 4.0–10.5)
nRBC: 0 % (ref 0.0–0.2)

## 2022-12-31 LAB — COMPREHENSIVE METABOLIC PANEL
ALT: 34 U/L (ref 0–44)
AST: 22 U/L (ref 15–41)
Albumin: 3.7 g/dL (ref 3.5–5.0)
Alkaline Phosphatase: 48 U/L (ref 38–126)
Anion gap: 15 (ref 5–15)
BUN: 10 mg/dL (ref 8–23)
CO2: 24 mmol/L (ref 22–32)
Calcium: 9.6 mg/dL (ref 8.9–10.3)
Chloride: 97 mmol/L — ABNORMAL LOW (ref 98–111)
Creatinine, Ser: 0.9 mg/dL (ref 0.61–1.24)
GFR, Estimated: >60 mL/min (ref >60)
Glucose, Bld: 104 mg/dL — ABNORMAL HIGH (ref 70–99)
Potassium: 3.8 mmol/L (ref 3.5–5.1)
Sodium: 136 mmol/L (ref 135–145)
Total Bilirubin: 0.9 mg/dL (ref 0.3–1.2)
Total Protein: 7.3 g/dL (ref 6.5–8.1)

## 2022-12-31 NOTE — ED Triage Notes (Signed)
Patient here today with c/o loss of appetite, weakness, nausea, and bad taste in mouth X 2 months. Patient has been here a few times with the same complaint and has an appointment with a GI doctor but not for another month. Had H Pylori test done but wont have the results until tomorrow.

## 2022-12-31 NOTE — Telephone Encounter (Signed)
Incoming call received from patient questioning if his lab results are back for the h.pylori test. Results are not back and patient was informed.  Patient states he took a medication prescribed by Dr.Bland before and it did not bother his stomach. Sucralfate oral suspension 2 teaspoons 15 min before meals and at bedtime on an empty stomach. Patient would like to know if he should take the sucralfate instead of omeprazole.   Please advise

## 2022-12-31 NOTE — ED Triage Notes (Signed)
Pt presents with continued problems with nausea. Pt reports he has been seen for this by GI and "no one seems to be able to figure it out"  States he feels like when he eats the food comes back up into his esophagus.  No vomiting.

## 2023-01-01 ENCOUNTER — Ambulatory Visit (INDEPENDENT_AMBULATORY_CARE_PROVIDER_SITE_OTHER): Payer: Medicare PPO | Admitting: *Deleted

## 2023-01-01 DIAGNOSIS — J309 Allergic rhinitis, unspecified: Secondary | ICD-10-CM

## 2023-01-01 LAB — CBC WITH DIFFERENTIAL/PLATELET
Absolute Monocytes: 422 {cells}/uL (ref 200–950)
Basophils Absolute: 10 {cells}/uL (ref 0–200)
Basophils Relative: 0.3 %
Eosinophils Absolute: 31 {cells}/uL (ref 15–500)
Eosinophils Relative: 0.9 %
HCT: 39.9 % (ref 38.5–50.0)
Hemoglobin: 13 g/dL — ABNORMAL LOW (ref 13.2–17.1)
Lymphs Abs: 802 cells/uL — ABNORMAL LOW (ref 850–3900)
MCH: 28.3 pg (ref 27.0–33.0)
MCHC: 32.6 g/dL (ref 32.0–36.0)
MCV: 86.7 fL (ref 80.0–100.0)
MPV: 10.5 fL (ref 7.5–12.5)
Monocytes Relative: 12.4 %
Neutro Abs: 2135 {cells}/uL (ref 1500–7800)
Neutrophils Relative %: 62.8 %
Platelets: 233 10*3/uL (ref 140–400)
RBC: 4.6 10*6/uL (ref 4.20–5.80)
RDW: 15.5 % — ABNORMAL HIGH (ref 11.0–15.0)
Total Lymphocyte: 23.6 %
WBC: 3.4 10*3/uL — ABNORMAL LOW (ref 3.8–10.8)

## 2023-01-01 LAB — H. PYLORI BREATH TEST: H. pylori Breath Test: NOT DETECTED

## 2023-01-01 LAB — HEMOGLOBIN A1C
Hgb A1c MFr Bld: 6.2 %{Hb} — ABNORMAL HIGH (ref <5.7)
Mean Plasma Glucose: 131 mg/dL
eAG (mmol/L): 7.3 mmol/L

## 2023-01-01 LAB — TSH: TSH: 1.14 m[IU]/L (ref 0.40–4.50)

## 2023-01-01 MED ORDER — ALUM & MAG HYDROXIDE-SIMETH 200-200-20 MG/5ML PO SUSP
30.0000 mL | Freq: Once | ORAL | Status: AC
  Administered 2023-01-01: 30 mL via ORAL
  Filled 2023-01-01: qty 30

## 2023-01-01 MED ORDER — LIDOCAINE VISCOUS HCL 2 % MT SOLN
15.0000 mL | Freq: Once | OROMUCOSAL | Status: AC
  Administered 2023-01-01: 15 mL via OROMUCOSAL
  Filled 2023-01-01: qty 15

## 2023-01-01 NOTE — ED Provider Notes (Signed)
[place] EMERGENCY DEPARTMENT AT [facility] Provider Note   CSN: [id number] Arrival date & time: 12/31/22  1958     History  Chief Complaint  Patient presents with   Nausea    Kenneth Daniel is a 77 y.o. male.  77 year old male with prior medical history as detailed below presents for evaluation.  Patient with chronic issues with nausea.  Patient reports that he has an appointment on September 25 with GI.  Patient reports that he has not yet been evaluated by GI.  His PCP is offered multiple modalities to treat suspected GERD.  Patient reports that he has a persistent "bad taste in his mouth"which is associated with mild nausea.  He has not vomited.  He denies fever.  He denies abdominal pain.  He denies chest pain or shortness of breath.    The history is provided by the patient and medical records.       Home Medications Prior to Admission medications   Medication Sig Start Date End Date Taking? Authorizing Provider  acetaminophen (TYLENOL) 500 MG tablet Take 1,000 mg by mouth every 8 (eight) hours as needed for moderate pain.    [provider]  ALPRAZolam Prudy Feeler) 0.25 MG tablet Take 0.25 mg by mouth daily as needed. 12/26/21   [provider]  aluminum hydroxide-magnesium carbonate (GAVISCON) 95-358 MG/15ML SUSP Take 30 mLs by mouth as needed for indigestion or heartburn.    [provider]  amLODipine (NORVASC) 10 MG tablet Take 10 mg by mouth daily.    [provider]  ascorbic acid (VITAMIN C) 500 MG tablet Take 500 mg by mouth daily.    [provider]  aspirin 81 MG EC tablet Take 81 mg by mouth at bedtime.    [provider]  atorvastatin (LIPITOR) 40 MG tablet Take 40 mg by mouth at bedtime.    [provider]  Azelastine HCl 137 MCG/SPRAY SOLN PLACE 2 SPRAYS INTO BOTH NOSTRILS 2 (TWO) TIMES DAILY 11/13/22   Alfonse Spruce, MD  b complex vitamins tablet Take 1 tablet by mouth daily as  needed.    [provider]  cholecalciferol (VITAMIN D3) 25 MCG (1000 UNIT) tablet Take 1,000 Units by mouth daily.    [provider]  Coenzyme Q10-levOCARNitine (CO Q-10 PLUS PO) Take 1 tablet by mouth daily at 12 noon.    [provider]  desloratadine (CLARINEX) 5 MG tablet TAKE 1 TABLET BY MOUTH 2 TIMES DAILY AS NEEDED. 11/13/22   Alfonse Spruce, MD  diclofenac Sodium (VOLTAREN) 1 % GEL Apply 4 g topically 4 (four) times daily. 07/03/21   Melene Plan, DO  Digestive Enzymes CAPS Take one 20 minutes before meals 11/09/22   Rodriguez-Southworth, Nettie Elm, PA-C  ECHINACEA EXTRACT PO Take 1 capsule by mouth daily as needed (cold symptoms).    [provider]  EPINEPHrine 0.3 mg/0.3 mL IJ SOAJ injection Use as directed for life threatening allergic reactions 11/10/22   Verlee Monte, MD  esomeprazole (NEXIUM) 40 MG capsule Take 1 capsule (40 mg total) by mouth daily. 11/09/22   Rodriguez-Southworth, Nettie Elm, PA-C  famotidine (PEPCID) 40 MG tablet Take 40 mg by mouth daily.    [provider]  fluticasone (FLONASE) 50 MCG/ACT nasal spray Place 2 sprays into both nostrils daily. 11/10/22   Verlee Monte, MD  hydrOXYzine (VISTARIL) 25 MG capsule Take 1 capsule (25 mg total) by mouth every 8 (eight) hours as needed for anxiety. 12/18/22  Roxy Horseman, PA-C  Magnesium 250 MG TABS Take 250 mg by mouth at bedtime.    [provider]  Melatonin 5 MG CAPS Take 10 mg by mouth at bedtime.    [provider]  nebivolol (BYSTOLIC) 10 MG tablet TAKE 1 TABLET BY MOUTH EVERY DAY IN THE MORNING 12/31/22   Tolia, Sunit, DO  Nutritional Supplements (JUICE PLUS FIBRE PO) Take 2 capsules by mouth daily.    [provider]  omeprazole (PRILOSEC) 40 MG capsule Take 1 capsule (40 mg total) by mouth in the morning and at bedtime. 12/29/22   Venita Sheffield, MD  Polyethylene Glycol 3350 (MIRALAX PO) Take by mouth daily.    [provider]   Probiotic Product (PROBIOTIC DAILY PO) Take 1 capsule by mouth daily.    [provider]  sucralfate (CARAFATE) 1 g tablet Take 1 tablet (1 g total) by mouth 3 (three) times daily. 12/29/22   Venita Sheffield, MD  sucralfate (CARAFATE) 1 GM/10ML suspension SMARTSIG:2 Teaspoon By Mouth 4 Times Daily 11/10/22   [provider]  triamcinolone cream (KENALOG) 0.1 % Apply 1 application. topically 2 (two) times daily. 09/02/21   Alfonse Spruce, MD  valsartan-hydrochlorothiazide (DIOVAN-HCT) 320-12.5 MG tablet Take 1 tablet by mouth daily. 02/21/20   [provider]  XTANDI 40 MG tablet Take 80 mg by mouth 2 (two) times daily. 10/19/22   [provider]      Allergies    Patient has no known allergies.    Review of Systems   Review of Systems  All other systems reviewed and are negative.   Physical Exam Updated Vital Signs BP (!) 148/83   Pulse 77   Temp 98.6 F (37 C)   Resp 16   SpO2 98%  Physical Exam Vitals and nursing note reviewed.  Constitutional:      General: He is not in acute distress.    Appearance: Normal appearance. He is well-developed.  HENT:     Head: Normocephalic and atraumatic.  Eyes:     Conjunctiva/sclera: Conjunctivae normal.     Pupils: Pupils are equal, round, and reactive to light.  Cardiovascular:     Rate and Rhythm: Normal rate and regular rhythm.     Heart sounds: Normal heart sounds.  Pulmonary:     Effort: Pulmonary effort is normal. No respiratory distress.     Breath sounds: Normal breath sounds.  Abdominal:     General: There is no distension.     Palpations: Abdomen is soft.     Tenderness: There is no abdominal tenderness.  Musculoskeletal:        General: No deformity. Normal range of motion.     Cervical back: Normal range of motion and neck supple.  Skin:    General: Skin is warm and dry.  Neurological:     General: No focal deficit present.     Mental Status: He is alert and oriented to  person, place, and time.     ED Results / Procedures / Treatments   Labs (all labs ordered are listed, but only abnormal results are displayed) Labs Reviewed  COMPREHENSIVE METABOLIC PANEL - Abnormal; Notable for the following components:      Result Value   Chloride 97 (*)    Glucose, Bld 104 (*)    All other components within normal limits  CBC - Abnormal; Notable for the following components:   WBC 3.2 (*)    Hemoglobin 12.5 (*)    HCT  38.3 (*)    RDW 16.2 (*)    All other components within normal limits    EKG None  Radiology No results found.  Procedures Procedures    Medications Ordered in ED Medications  alum & mag hydroxide-simeth (MAALOX/MYLANTA) 200-200-20 MG/5ML suspension 30 mL (has no administration in time range)  lidocaine (XYLOCAINE) 2 % viscous mouth solution 15 mL (has no administration in time range)    ED Course/ Medical Decision Making/ A&P                                 Medical Decision Making Amount and/or Complexity of Data Reviewed Labs: ordered.  Risk OTC drugs. Prescription drug management.    Medical Screen Complete  This patient presented to the ED with complaint of GERD.  This complaint involves an extensive number of treatment options. The initial differential diagnosis includes, but is not limited to, GERD  This presentation is: Chronic, Self-Limited, Previously Undiagnosed, and Uncertain Prognosis  Patient with apparent longstanding history of GERD.  He complains of persistent symptoms despite use of famotidine regularly.  Patient's described symptoms are chronic in nature.  Patient without significant acute pathology found on exam or on workup.  With p.o. Maalox and p.o. lidocaine he reports that he feels significantly improved.  He is comfortable with plan for discharge home.  He does have already scheduled follow-up with GI for next month.  He understands need for close outpatient follow-up.  Strict return  precautions given and understood.  Importance of close follow-up is repeatedly stressed.  Additional history obtained:  External records from outside sources obtained and reviewed including prior ED visits and prior Inpatient records.   Problem List / ED Course:  GERD   Reevaluation:  After the interventions noted above, I reevaluated the patient and found that they have: improved   Disposition:  After consideration of the diagnostic results and the patients response to treatment, I feel that the patent would benefit from close outpatient follow-up.          Final Clinical Impression(s) / ED Diagnoses Final diagnoses:  Nausea  Gastroesophageal reflux disease, unspecified whether esophagitis present    Rx / DC Orders ED Discharge Orders     None         Wynetta Fines, MD 01/01/23 [phone]

## 2023-01-01 NOTE — Telephone Encounter (Signed)
Below is Dr.Veludandi's response:  Kenneth Sheffield, MD  You1 minute ago (8:44 AM)    He can take sucralfate  I discussed response with patient and he asked if he could call us back

## 2023-01-01 NOTE — Discharge Instructions (Addendum)
Return for any problem.  Follow-up closely with GI as instructed.  Use over-the-counter Maalox as instructed prior to meals.  This may help some of your symptoms.

## 2023-01-01 NOTE — ED Notes (Signed)
X3 no response for vitals recheck 

## 2023-01-01 NOTE — Telephone Encounter (Signed)
Patient returned call and let him know of Dr. Sharyne Peach response. He verbalized his understanding.

## 2023-01-05 DIAGNOSIS — I10 Essential (primary) hypertension: Secondary | ICD-10-CM | POA: Diagnosis not present

## 2023-01-05 DIAGNOSIS — D4110 Neoplasm of uncertain behavior of unspecified renal pelvis: Secondary | ICD-10-CM | POA: Diagnosis not present

## 2023-01-05 DIAGNOSIS — C61 Malignant neoplasm of prostate: Secondary | ICD-10-CM | POA: Diagnosis not present

## 2023-01-05 DIAGNOSIS — F064 Anxiety disorder due to known physiological condition: Secondary | ICD-10-CM | POA: Diagnosis not present

## 2023-01-05 NOTE — ED Provider Notes (Signed)
[facility]   [id number] 12/31/22 Arrival Time: 1600  ASSESSMENT & PLAN:  1. Dysphagia, unspecified type    Tells me tonight that he cannot eat anything and is nervous with trying fluids. Gets choked. To ED for further evaluation. I simply do not have the resources here to help him. D/C in stable condition via POV.   Follow-up Information     Go to  [facility] Emergency Department at [facility].   Specialty: Emergency Medicine Contact information: [address] [place] [address] [phone]                Reviewed expectations re: course of current medical issues. Questions answered. Outlined signs and symptoms indicating need for more acute intervention. Patient verbalized understanding. After Visit Summary given.   SUBJECTIVE: History from: patient. Kenneth Daniel is a 77 y.o. male who is here today with c/o loss of appetite, weakness, nausea, and bad taste in mouth X 2 months. Patient has been here a few times with the same complaint and has an appointment with a GI doctor but not for another month. Had H Pylori test done but wont have the results until tomorrow. Having significant trouble swallowing; sometimes with liquids. Denies fever/abd pain.  OBJECTIVE:  Vitals:   12/31/22 1715  BP: (!) 176/90  Pulse: 76  Resp: 16  Temp: 98.7 F (37.1 C)  TempSrc: Oral  SpO2: 96%  Weight: 81 kg  Height: 6\' 2"  (1.88 m)    General appearance: alert; no distress Eyes: PERRLA; EOMI; conjunctiva normal HENT: normocephalic; atraumatic Lungs: clear to auscultation bilaterally; unlabored Heart: regular rate and rhythm Abdomen: soft, non-tender; bowel sounds normal; no masses or organomegaly; no guarding or rebound tenderness Back: no CVA tenderness Extremities: no edema; symmetrical with no gross deformities Skin: warm and dry Neurologic: normal gait; normal symmetric reflexes Psychological: alert and cooperative; normal  mood and affect   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, right    per last duplex in epic 06-04-2020  right ICA 16-49% and right ECA >50%   Chronic constipation    GERD (gastroesophageal reflux disease)    History of acute pyelonephritis 09/2020   w/ admission in epic due to severe sepsis   History of COVID-19 11/18/2020   positive result in epic;   per pt mild to moderate symptoms that resolved   History of external beam radiation therapy    completed in 2015 for recurrent prostate cancer (done in [facility])   History of prostate cancer    (current urologist-- dr Benancio Deeds, and current treatment lupron injeciton q 6months) ;   per pt in [place], [place] 2004  s/p radical prostatectomy;   recurrence 2015 completed radiation   HLD (hyperlipidemia)    Hypertension    followed by pcp and cardiology---   (05-29-2020  nuclear stress test in epic , low risk no ischemia nuclear ef 63%)   Inguinal hernia, right    Non-rheumatic aortic regurgitation    mild to moderate AR without stenosis per last echo in epic 02-27-2021   Perennial allergic rhinitis    followed [facility] dr [facility]ollager ([facility])   Wears dentures    upper   Wears glasses    Wears hearing aid in both ears    Social History   Socioeconomic History   Marital status: Single    Spouse name: Not on file  Number of children: Not on file   Years of education: Not on file   Highest education level: Not on file  Occupational History   Not on file  Tobacco Use   Smoking status: Former    Current packs/day: 0.00    Types: Cigarettes    Start date: 17    Quit date: 62    Years since quitting: 51.7   Smokeless tobacco: Never  Vaping Use   Vaping status: Never Used  Substance and Sexual Activity   Alcohol use: Never   Drug use: Never   Sexual activity: Not Currently  Other Topics Concern   Not on file  Social History Narrative   Not  on file   Social Determinants of Health   Financial Resource Strain: Not on file  Food Insecurity: Low Risk  (12/22/2022)   Received from [facility]   Food vital sign    Within the past 12 months, you worried that your food would run out before you got money to buy more: Never true    Within the past 12 months, the food you bought just didn't last and you didn't have money to get more. : Never true  Transportation Needs: Not on file (12/22/2022)  Physical Activity: Not on file  Stress: Not on file  Social Connections: Not on file  Intimate Partner Violence: Not on file   Family History  Problem Relation Age of Onset   Pancreatic cancer Mother    Prostate cancer Father    Past Surgical History:  Procedure Laterality Date   ANTERIOR CERVICAL DECOMP/DISCECTOMY FUSION N/A 11/09/2019   Procedure: ANTERIOR CERVICAL DECOMPRESSION FUSION CERVICAL 3-4 WITH INSTRUMENTATION AND ALLOGRAFT;  Surgeon: Estill Bamberg, MD;  Location: [facility] OR;  Service: Orthopedics;  Laterality: N/A;   CARDIAC CATHETERIZATION  2002   in [place], [place];   per pt stress test with possible ischemia,  told arteries normal   COLONOSCOPY  2020   CYST EXCISION  2005   per pt from back , was benign   INGUINAL HERNIA REPAIR Right 06/04/2021   Procedure: OPEN RIGHT INGUINAL HERNIA REPAIR;  Surgeon: Berna Bue, MD;  Location: [facility];  Service: General;  Laterality: Right;   PROSTATECTOMY  2004   in [place], [place]   UPPER GI ENDOSCOPY  2021     Mardella Layman, MD 01/05/23 1020

## 2023-01-07 ENCOUNTER — Ambulatory Visit (INDEPENDENT_AMBULATORY_CARE_PROVIDER_SITE_OTHER): Payer: Medicare PPO

## 2023-01-07 DIAGNOSIS — J309 Allergic rhinitis, unspecified: Secondary | ICD-10-CM

## 2023-01-11 DIAGNOSIS — B9681 Helicobacter pylori [H. pylori] as the cause of diseases classified elsewhere: Secondary | ICD-10-CM | POA: Diagnosis not present

## 2023-01-11 DIAGNOSIS — K219 Gastro-esophageal reflux disease without esophagitis: Secondary | ICD-10-CM | POA: Diagnosis not present

## 2023-01-11 DIAGNOSIS — D649 Anemia, unspecified: Secondary | ICD-10-CM | POA: Diagnosis not present

## 2023-01-11 DIAGNOSIS — R634 Abnormal weight loss: Secondary | ICD-10-CM | POA: Diagnosis not present

## 2023-01-12 ENCOUNTER — Other Ambulatory Visit: Payer: Self-pay | Admitting: Nurse Practitioner

## 2023-01-12 ENCOUNTER — Ambulatory Visit (INDEPENDENT_AMBULATORY_CARE_PROVIDER_SITE_OTHER): Payer: Medicare PPO | Admitting: Licensed Clinical Social Worker

## 2023-01-12 DIAGNOSIS — F4323 Adjustment disorder with mixed anxiety and depressed mood: Secondary | ICD-10-CM

## 2023-01-12 DIAGNOSIS — R634 Abnormal weight loss: Secondary | ICD-10-CM

## 2023-01-12 NOTE — Progress Notes (Signed)
[facility] Counselor/Therapist Progress Note  Patient ID: Kenneth Daniel, MRN: [id number]    Date: 01/12/23  Time Spent: 0808  am - 0859 am : 51 Minutes  Treatment Type: Individual Therapy.  Reported Symptoms: Symptoms of depression and anxiety  Mental Status Exam: Appearance:  Casual     Behavior: Appropriate  Motor: Normal  Speech/Language:  Clear and Coherent  Affect: Appropriate  Mood: normal  Thought process: normal  Thought content:   WNL  Sensory/Perceptual disturbances:   WNL  Orientation: oriented to person, place, time/date, situation, day of week, month of year, and year  Attention: Good  Concentration: Good  Memory: WNL  Fund of knowledge:  Good  Insight:   Good  Judgment:  Good  Impulse Control: Good   Risk Assessment: Danger to Self:  No Self-injurious Behavior: No Danger to Others: No Duty to Warn:no Physical Aggression / Violence:No  Access to Firearms a concern: No  Gang Involvement:No   Subjective:   Kenneth Daniel participated from office located at [facility] with Clinician present. Kenneth Daniel consented to treatment.   Patient presented in a positive mood for his session. Patient identified that he had an appointment with  his GI and has a test scheduled. Patient was more optimistic about his condition. Patient identified fears due to losing close friends and family recently. Patient reports that he is working on eating and his sister is there to encourage and help him. Patient also reports that he recognizes his need to become more active again and engage in social activities to prevent him from being so lonely and thinking negatively.   Interventions: Cognitive Behavioral Therapy  Diagnosis: Adjustment disorder with mixed anxiety and depression   Plan: Kenneth Daniel  is to use CBT, mindfulness and coping skills to help manage decrease symptoms associated with their diagnosis.   Long-term goal:   Kenneth Daniel will reduce overall  level, frequency, and intensity of the feelings of depression, anxiety and loneliness evidenced by decreased irritability, negative self talk, and helpless feelings from 6 to 7 days/week to 0 to 2 days/week per client report for at least 3 consecutive months. Treatment plan to be reviewed by 12/28/2023.  Short-term goal:  Kenneth Daniel will verbally express understanding of the relationship between feelings of depression, anxiety and their impact on thinking patterns and behaviors. Verbalize an understanding of the role that distorted thinking plays in creating fears, excessive worry, and ruminations.  Kenneth Daniel MSW, LCSW DATE:01/12/2023

## 2023-01-14 ENCOUNTER — Ambulatory Visit (INDEPENDENT_AMBULATORY_CARE_PROVIDER_SITE_OTHER): Payer: Medicare PPO

## 2023-01-14 DIAGNOSIS — J309 Allergic rhinitis, unspecified: Secondary | ICD-10-CM | POA: Diagnosis not present

## 2023-01-15 ENCOUNTER — Ambulatory Visit
Admission: RE | Admit: 2023-01-15 | Discharge: 2023-01-15 | Disposition: A | Discharge status: Home / Self Care | Payer: Medicare PPO | Source: Ambulatory Visit | Attending: Nurse Practitioner | Admitting: Nurse Practitioner

## 2023-01-15 DIAGNOSIS — R140 Abdominal distension (gaseous): Secondary | ICD-10-CM | POA: Diagnosis not present

## 2023-01-15 DIAGNOSIS — I700 Atherosclerosis of aorta: Secondary | ICD-10-CM | POA: Diagnosis not present

## 2023-01-15 DIAGNOSIS — R634 Abnormal weight loss: Secondary | ICD-10-CM

## 2023-01-15 DIAGNOSIS — R1013 Epigastric pain: Secondary | ICD-10-CM | POA: Diagnosis not present

## 2023-01-15 MED ORDER — IOPAMIDOL (ISOVUE-300) INJECTION 61%
100.0000 mL | Freq: Once | INTRAVENOUS | Status: AC | PRN
  Administered 2023-01-15: 100 mL via INTRAVENOUS

## 2023-01-21 ENCOUNTER — Encounter: Payer: Self-pay | Admitting: Cardiology

## 2023-01-21 NOTE — Progress Notes (Signed)
Patient was last seen in the office in March 2024.  Spoke to him over the phone.  He denies anginal chest pain or heart failure symptoms.  Overall functional capacity remains stable.  He is scheduled for a colonoscopy and endoscopy in the coming weeks.  Prior echo and stress test results reviewed.  Overall acceptable risk from a cardiovascular standpoint.  I have asked her to hold aspirin 7 days prior to procedure or as requested by his proceduralist.  He can restart after the procedure as directed by GI.  Tessa Lerner, [place], [facility]  Pager:  [phone] Office: [phone]

## 2023-01-22 ENCOUNTER — Ambulatory Visit: Payer: Self-pay

## 2023-01-26 DIAGNOSIS — K2930 Chronic superficial gastritis without bleeding: Secondary | ICD-10-CM | POA: Diagnosis not present

## 2023-01-26 DIAGNOSIS — R634 Abnormal weight loss: Secondary | ICD-10-CM | POA: Diagnosis not present

## 2023-01-26 DIAGNOSIS — D124 Benign neoplasm of descending colon: Secondary | ICD-10-CM | POA: Diagnosis not present

## 2023-01-26 DIAGNOSIS — K2970 Gastritis, unspecified, without bleeding: Secondary | ICD-10-CM | POA: Diagnosis not present

## 2023-01-26 DIAGNOSIS — D122 Benign neoplasm of ascending colon: Secondary | ICD-10-CM | POA: Diagnosis not present

## 2023-01-26 DIAGNOSIS — R1013 Epigastric pain: Secondary | ICD-10-CM | POA: Diagnosis not present

## 2023-01-26 NOTE — Progress Notes (Unsigned)
Richarda Overlie, MD  Leodis Rains D Cancel biopsy, discussed with Dr. Parke Simmers.  [place]enn

## 2023-01-28 ENCOUNTER — Ambulatory Visit (INDEPENDENT_AMBULATORY_CARE_PROVIDER_SITE_OTHER): Payer: Medicare PPO

## 2023-01-28 ENCOUNTER — Ambulatory Visit: Payer: Medicare PPO | Admitting: Licensed Clinical Social Worker

## 2023-01-28 DIAGNOSIS — F4323 Adjustment disorder with mixed anxiety and depressed mood: Secondary | ICD-10-CM | POA: Diagnosis not present

## 2023-01-28 DIAGNOSIS — J309 Allergic rhinitis, unspecified: Secondary | ICD-10-CM

## 2023-01-28 NOTE — Progress Notes (Signed)
[facility] Counselor/Therapist Progress Note  Patient ID: LYNX MURZYN, MRN: [id number]    Date: 01/28/23  Time Spent: 0810  am - 0955 am : 45 Minutes  Treatment Type: Individual Therapy.  Reported Symptoms: Symptoms of depression and anxiety related to recent move and health issues  Mental Status Exam: Appearance:  Casual     Behavior: Appropriate  Motor: Normal  Speech/Language:  Clear and Coherent  Affect: Appropriate  Mood: normal  Thought process: normal  Thought content:   WNL  Sensory/Perceptual disturbances:   WNL  Orientation: oriented to person, place, time/date, situation, day of week, month of year, and year  Attention: Good  Concentration: Good  Memory: WNL  Fund of knowledge:  Good  Insight:   Good  Judgment:  Good  Impulse Control: Good   Risk Assessment: Danger to Self:  No Self-injurious Behavior: No Danger to Others: No Duty to Warn:no Physical Aggression / Violence:No  Access to Firearms a concern: No  Gang Involvement:No   Subjective:   [name] participated from office located at [facility] with Clinician present. Omir consented to treatment.    Nilton presented for his session reporting that he had his colonoscopy and endoscopy on Tuesday this week. Patient reports that his doctor reported that the H- Pylori had left scar tissue behind that was causing some of the stomach discomfort he has been feeling.  Patient reported being relieved and now he is waiting on the biopsy information. Clinician assisted patient in identifying how to see his test results on my chart and how to send messages to his provider. 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 Patient was much more optimistic and looking forward to moving forward.  Interventions: Cognitive Behavioral Therapy  Diagnosis: Adjustment Disorder with Mixed depression and anxiety   Plan: Machi is to use CBT,  mindfulness and coping skills to help manage decrease symptoms associated with their diagnosis.   Long-term goal:   Rylie will reduce overall level, frequency, and intensity of the feelings of depression, anxiety and panic evidenced by decreased irritability, negative self talk, and helpless feelings from 6 to 7 days/week to 0 to 2 days/week per client report for at least 3 consecutive months. Treatment plan to be reviewed by 12/28/2023.  Short-term goal:  Heshy will verbally express understanding of the relationship between feelings of depression, anxiety and their impact on thinking patterns and behaviors. Verbalize an understanding of the role that distorted thinking plays in creating fears, excessive worry, and ruminations.  Phyllis Ginger MSW, LCSW DATE: 01/28/2023

## 2023-01-29 DIAGNOSIS — F064 Anxiety disorder due to known physiological condition: Secondary | ICD-10-CM | POA: Diagnosis not present

## 2023-01-29 DIAGNOSIS — I10 Essential (primary) hypertension: Secondary | ICD-10-CM | POA: Diagnosis not present

## 2023-01-29 DIAGNOSIS — K30 Functional dyspepsia: Secondary | ICD-10-CM | POA: Diagnosis not present

## 2023-01-29 DIAGNOSIS — D4110 Neoplasm of uncertain behavior of unspecified renal pelvis: Secondary | ICD-10-CM | POA: Diagnosis not present

## 2023-01-29 DIAGNOSIS — N2889 Other specified disorders of kidney and ureter: Secondary | ICD-10-CM | POA: Diagnosis not present

## 2023-01-29 DIAGNOSIS — B9681 Helicobacter pylori [H. pylori] as the cause of diseases classified elsewhere: Secondary | ICD-10-CM | POA: Diagnosis not present

## 2023-02-02 ENCOUNTER — Ambulatory Visit (INDEPENDENT_AMBULATORY_CARE_PROVIDER_SITE_OTHER): Payer: Self-pay | Admitting: *Deleted

## 2023-02-02 DIAGNOSIS — K2930 Chronic superficial gastritis without bleeding: Secondary | ICD-10-CM | POA: Diagnosis not present

## 2023-02-02 DIAGNOSIS — J309 Allergic rhinitis, unspecified: Secondary | ICD-10-CM | POA: Diagnosis not present

## 2023-02-02 DIAGNOSIS — D124 Benign neoplasm of descending colon: Secondary | ICD-10-CM | POA: Diagnosis not present

## 2023-02-05 DIAGNOSIS — I5030 Unspecified diastolic (congestive) heart failure: Secondary | ICD-10-CM | POA: Diagnosis not present

## 2023-02-05 DIAGNOSIS — I25119 Atherosclerotic heart disease of native coronary artery with unspecified angina pectoris: Secondary | ICD-10-CM | POA: Diagnosis not present

## 2023-02-05 DIAGNOSIS — C61 Malignant neoplasm of prostate: Secondary | ICD-10-CM | POA: Diagnosis not present

## 2023-02-05 DIAGNOSIS — J8410 Pulmonary fibrosis, unspecified: Secondary | ICD-10-CM | POA: Diagnosis not present

## 2023-02-05 DIAGNOSIS — Z1329 Encounter for screening for other suspected endocrine disorder: Secondary | ICD-10-CM | POA: Diagnosis not present

## 2023-02-05 DIAGNOSIS — R7303 Prediabetes: Secondary | ICD-10-CM | POA: Diagnosis not present

## 2023-02-05 DIAGNOSIS — I110 Hypertensive heart disease with heart failure: Secondary | ICD-10-CM | POA: Diagnosis not present

## 2023-02-05 DIAGNOSIS — F320 Major depressive disorder, single episode, mild: Secondary | ICD-10-CM | POA: Diagnosis not present

## 2023-02-05 DIAGNOSIS — F40243 Fear of flying: Secondary | ICD-10-CM | POA: Diagnosis not present

## 2023-02-05 DIAGNOSIS — E7800 Pure hypercholesterolemia, unspecified: Secondary | ICD-10-CM | POA: Diagnosis not present

## 2023-02-05 DIAGNOSIS — F1310 Sedative, hypnotic or anxiolytic abuse, uncomplicated: Secondary | ICD-10-CM | POA: Diagnosis not present

## 2023-02-09 ENCOUNTER — Encounter (HOSPITAL_COMMUNITY): Payer: Self-pay | Admitting: Emergency Medicine

## 2023-02-09 ENCOUNTER — Ambulatory Visit (HOSPITAL_COMMUNITY)
Admission: EM | Admit: 2023-02-09 | Discharge: 2023-02-09 | Disposition: A | Discharge status: Home / Self Care | Payer: Medicare PPO

## 2023-02-09 DIAGNOSIS — R238 Other skin changes: Secondary | ICD-10-CM

## 2023-02-09 NOTE — ED Triage Notes (Signed)
Pt took train ride that was 24 hours. Reports noticed pinkish-red spots on hands that go away when wash but will come back. Denies itching or pain. Did take Dramamine while on train ride.

## 2023-02-09 NOTE — Discharge Instructions (Signed)
I suspect the marks on her hands are likely due to pen ink.  Monitor the hands to make sure that the redness does not spread or change.  Continue to wash hands as normal.  Follow-up with PCP as needed.

## 2023-02-09 NOTE — ED Provider Notes (Signed)
[facility]    CSN: [id number] Arrival date & time: 02/09/23  1608      History   Chief Complaint Chief Complaint  Patient presents with   Skin Discoloration    HPI Kenneth Daniel is a 77 y.o. male.   Patient presents to urgent care for evaluation of marks to the palms of the skin that he first noticed yesterday after he got on a train to return to [place] from [place], [place]. He's unsure of how long the marks have been on the hands. Markings are red and he sates he was able to wash them off when he first noticed them, but they've returned. This has never happened in the past. No fevers, chills, nausea, hand swelling, or recent injuries to the hands. After more questioning, he believes he may have a red pen and this may have caused markings but he is unable to remember. Attempted to remove markings with soap and water in clinic unsuccessfully. No pain, itching, or irritation associated with skin markings.     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, right    per last duplex in epic 06-04-2020  right ICA 16-49% and right ECA >50%   Chronic constipation    GERD (gastroesophageal reflux disease)    History of acute pyelonephritis 09/2020   w/ admission in epic due to severe sepsis   History of COVID-19 11/18/2020   positive result in epic;   per pt mild to moderate symptoms that resolved   History of external beam radiation therapy    completed in 2015 for recurrent prostate cancer (done in [facility])   History of prostate cancer    (current urologist-- dr Benancio Deeds, and current treatment lupron injeciton q 6months) ;   per pt in [place], [place] 2004  s/p radical prostatectomy;   recurrence 2015 completed radiation   HLD (hyperlipidemia)    Hypertension    followed by pcp and cardiology---   (05-29-2020  nuclear stress test in epic , low risk no ischemia nuclear ef 63%)   Inguinal hernia, right     Non-rheumatic aortic regurgitation    mild to moderate AR without stenosis per last echo in epic 02-27-2021   Perennial allergic rhinitis    followed by dr gollager ([facility])   Wears dentures    upper   Wears glasses    Wears hearing aid in both ears     Patient Active Problem List   Diagnosis Date Noted   Nonrheumatic aortic valve insufficiency    Perennial allergic rhinitis 12/12/2020   Pruritus 12/12/2020   Seasonal allergic conjunctivitis 12/12/2020   Acute pyelonephritis 09/22/2020   Pyelonephritis 09/22/2020   Myelopathy (HCC) 11/09/2019    Past Surgical History:  Procedure Laterality Date   ANTERIOR CERVICAL DECOMP/DISCECTOMY FUSION N/A 11/09/2019   Procedure: ANTERIOR CERVICAL DECOMPRESSION FUSION CERVICAL 3-4 WITH INSTRUMENTATION AND ALLOGRAFT;  Surgeon: Estill Bamberg, MD;  Location: [facility];  Service: Orthopedics;  Laterality: N/A;   CARDIAC CATHETERIZATION  2002   in [place], [place];   per pt stress test with possible ischemia,  told arteries normal   COLONOSCOPY  2020   CYST EXCISION  2005   per pt from back , was benign   INGUINAL HERNIA REPAIR Right 06/04/2021   Procedure: OPEN RIGHT INGUINAL HERNIA REPAIR;  Surgeon: Berna Bue, MD;  Location: [facility];  Service: General;  Laterality:  Right;   PROSTATECTOMY  2004   in [place], [place]   UPPER GI ENDOSCOPY  2021       Home Medications    Prior to Admission medications   Medication Sig Start Date End Date Taking? Authorizing Provider  citalopram (CELEXA) 20 MG tablet Take 20 mg by mouth daily. 01/12/23  Yes [provider]  acetaminophen (TYLENOL) 500 MG tablet Take 1,000 mg by mouth every 8 (eight) hours as needed for moderate pain.    [provider]  ALPRAZolam Prudy Feeler) 0.25 MG tablet Take 0.25 mg by mouth daily as needed. 12/26/21   [provider]  aluminum hydroxide-magnesium carbonate (GAVISCON) 95-358 MG/15ML SUSP Take 30 mLs by mouth as needed for  indigestion or heartburn.    [provider]  amLODipine (NORVASC) 10 MG tablet Take 10 mg by mouth daily.    [provider]  ascorbic acid (VITAMIN C) 500 MG tablet Take 500 mg by mouth daily.    [provider]  aspirin 81 MG EC tablet Take 81 mg by mouth at bedtime.    [provider]  atorvastatin (LIPITOR) 40 MG tablet Take 40 mg by mouth at bedtime.    [provider]  Azelastine HCl 137 MCG/SPRAY SOLN PLACE 2 SPRAYS INTO BOTH NOSTRILS 2 (TWO) TIMES DAILY 11/13/22   Alfonse Spruce, MD  b complex vitamins tablet Take 1 tablet by mouth daily as needed.    [provider]  cholecalciferol (VITAMIN D3) 25 MCG (1000 UNIT) tablet Take 1,000 Units by mouth daily.    [provider]  Coenzyme Q10-levOCARNitine (CO Q-10 PLUS PO) Take 1 tablet by mouth daily at 12 noon.    [provider]  desloratadine (CLARINEX) 5 MG tablet TAKE 1 TABLET BY MOUTH 2 TIMES DAILY AS NEEDED. 11/13/22   Alfonse Spruce, MD  diclofenac Sodium (VOLTAREN) 1 % GEL Apply 4 g topically 4 (four) times daily. 07/03/21   Melene Plan, DO  Digestive Enzymes CAPS Take one 20 minutes before meals 11/09/22   Rodriguez-Southworth, Nettie Elm, PA-C  ECHINACEA EXTRACT PO Take 1 capsule by mouth daily as needed (cold symptoms).    [provider]  EPINEPHrine 0.3 mg/0.3 mL IJ SOAJ injection Use as directed for life threatening allergic reactions 11/10/22   Verlee Monte, MD  esomeprazole (NEXIUM) 40 MG capsule Take 1 capsule (40 mg total) by mouth daily. 11/09/22   Rodriguez-Southworth, Nettie Elm, PA-C  famotidine (PEPCID) 40 MG tablet Take 40 mg by mouth daily.    [provider]  fluticasone (FLONASE) 50 MCG/ACT nasal spray Place 2 sprays into both nostrils daily. 11/10/22   Verlee Monte, MD  hydrOXYzine (VISTARIL) 25 MG capsule Take 1 capsule (25 mg total) by mouth every 8 (eight) hours as needed for anxiety. 12/18/22   Roxy Horseman, PA-C   Magnesium 250 MG TABS Take 250 mg by mouth at bedtime.    [provider]  Melatonin 5 MG CAPS Take 10 mg by mouth at bedtime.    [provider]  nebivolol (BYSTOLIC) 10 MG tablet TAKE 1 TABLET BY MOUTH EVERY DAY IN THE MORNING 12/31/22   Tolia, Sunit, DO  Nutritional Supplements (JUICE PLUS FIBRE PO) Take 2 capsules by mouth daily.    [provider]  omeprazole (PRILOSEC) 40 MG capsule Take 1 capsule (40 mg total) by mouth in the morning and at bedtime. 12/29/22   Venita Sheffield, MD  Polyethylene Glycol 3350 (MIRALAX PO) Take by mouth daily.  [provider]  Probiotic Product (PROBIOTIC DAILY PO) Take 1 capsule by mouth daily.    [provider]  sucralfate (CARAFATE) 1 g tablet Take 1 tablet (1 g total) by mouth 3 (three) times daily. 12/29/22   Venita Sheffield, MD  sucralfate (CARAFATE) 1 GM/10ML suspension SMARTSIG:2 Teaspoon By Mouth 4 Times Daily 11/10/22   [provider]  triamcinolone cream (KENALOG) 0.1 % Apply 1 application. topically 2 (two) times daily. 09/02/21   Alfonse Spruce, MD  valsartan-hydrochlorothiazide (DIOVAN-HCT) 320-12.5 MG tablet Take 1 tablet by mouth daily. 02/21/20   [provider]  XTANDI 40 MG tablet Take 80 mg by mouth 2 (two) times daily. 10/19/22   [provider]    Family History Family History  Problem Relation Age of Onset   Pancreatic cancer Mother    Prostate cancer Father     Social History Social History   Tobacco Use   Smoking status: Former    Current packs/day: 0.00    Types: Cigarettes    Start date: 1969    Quit date: 1973    Years since quitting: 51.8   Smokeless tobacco: Never  Vaping Use   Vaping status: Never Used  Substance Use Topics   Alcohol use: Never   Drug use: Never     Allergies   Patient has no known allergies.   Review of Systems Review of Systems   Physical Exam Triage Vital Signs ED Triage Vitals  Encounter  Vitals Group     BP 02/09/23 1629 (!) 166/83     Systolic BP Percentile --      Diastolic BP Percentile --      Pulse Rate 02/09/23 1629 81     Resp 02/09/23 1629 17     Temp 02/09/23 1629 97.7 F (36.5 C)     Temp Source 02/09/23 1629 Oral     SpO2 02/09/23 1629 96 %     Weight --      Height --      Head Circumference --      Peak Flow --      Pain Score 02/09/23 1628 0     Pain Loc --      Pain Education --      Exclude from Growth Chart --    No data found.  Updated Vital Signs BP (!) 166/83 (BP Location: Left Arm)   Pulse 81   Temp 97.7 F (36.5 C) (Oral)   Resp 17   SpO2 96%   Visual Acuity Right Eye Distance:   Left Eye Distance:   Bilateral Distance:    Right Eye Near:   Left Eye Near:    Bilateral Near:     Physical Exam Vitals and nursing note reviewed.  Constitutional:      Appearance: He is not ill-appearing or toxic-appearing.  HENT:     Head: Normocephalic and atraumatic.     Right Ear: Hearing and external ear normal.     Left Ear: Hearing and external ear normal.     Nose: Nose normal.     Mouth/Throat:     Lips: Pink.  Eyes:     General: Lids are normal. Vision grossly intact. Gaze aligned appropriately.     Extraocular Movements: Extraocular movements intact.     Conjunctiva/sclera: Conjunctivae normal.  Pulmonary:     Effort: Pulmonary effort is normal.  Musculoskeletal:     Cervical back: Neck supple.  Skin:    General: Skin is warm  and dry.     Capillary Refill: Capillary refill takes less than 2 seconds.     Findings: No rash.     Comments: Red ink appearing skin markings to the bilateral hands as seen in images below. Non-tender. I am unable to wash these off with soap and water in clinic. No warmth/soft tissue swelling.  Neurological:     General: No focal deficit present.     Mental Status: He is alert and oriented to person, place, and time. Mental status is at baseline.     Cranial Nerves: No dysarthria or facial asymmetry.   Psychiatric:        Mood and Affect: Mood normal.        Speech: Speech normal.        Behavior: Behavior normal.        Thought Content: Thought content normal.        Judgment: Judgment normal.   Right hand with marking to to the right second finger   Left hand with marking to the palm near the third MCP    UC Treatments / Results  Labs (all labs ordered are listed, but only abnormal results are displayed) Labs Reviewed - No data to display  EKG   Radiology No results found.  Procedures Procedures (including critical care time)  Medications Ordered in UC Medications - No data to display  Initial Impression / Assessment and Plan / UC Course  I have reviewed the triage vital signs and the nursing notes.  Pertinent labs & imaging results that were available during my care of the patient were reviewed by me and considered in my medical decision making (see chart for details).   1. Skin irritation Skin irritation likely secondary to accidental markings from an ink pen/marker. No signs of infection. Advised to monitor sites for further abnormalities and return to urgent care should markings change.   Counseled patient on potential for adverse effects with medications prescribed/recommended today, strict ER and return-to-clinic precautions discussed, patient verbalized understanding.    Final Clinical Impressions(s) / UC Diagnoses   Final diagnoses:  Skin irritation     Discharge Instructions      I suspect the marks on her hands are likely due to pen ink.  Monitor the hands to make sure that the redness does not spread or change.  Continue to wash hands as normal.  Follow-up with PCP as needed.    ED Prescriptions   None    PDMP not reviewed this encounter.   Carlisle Beers, [place] 02/12/23 2122

## 2023-02-11 ENCOUNTER — Ambulatory Visit (INDEPENDENT_AMBULATORY_CARE_PROVIDER_SITE_OTHER): Payer: Medicare PPO

## 2023-02-11 DIAGNOSIS — J309 Allergic rhinitis, unspecified: Secondary | ICD-10-CM | POA: Diagnosis not present

## 2023-02-15 ENCOUNTER — Ambulatory Visit (INDEPENDENT_AMBULATORY_CARE_PROVIDER_SITE_OTHER): Payer: Medicare PPO | Admitting: Licensed Clinical Social Worker

## 2023-02-15 DIAGNOSIS — F4323 Adjustment disorder with mixed anxiety and depressed mood: Secondary | ICD-10-CM

## 2023-02-15 NOTE — Progress Notes (Signed)
 Behavioral Health Counselor/Therapist Progress Note  Patient ID: Kenneth Daniel, MRN: [id number]    Date: 02/15/23  Time Spent: 1010  am - 1100 am : 50 Minutes  Treatment Type: Individual Therapy.  Reported Symptoms: anxiety and depression related to recent illness  Mental Status Exam: Appearance:  Casual     Behavior: Appropriate  Motor: Normal  Speech/Language:  Clear and Coherent  Affect: Appropriate  Mood: normal  Thought process: normal  Thought content:   WNL  Sensory/Perceptual disturbances:   WNL  Orientation: oriented to person, place, time/date, situation, day of week, month of year, and year  Attention: Good  Concentration: Good  Memory: WNL  Fund of knowledge:  Good  Insight:   Good  Judgment:  Good  Impulse Control: Good   Risk Assessment: Danger to Self:  No Self-injurious Behavior: No Danger to Others: No Duty to Warn:no Physical Aggression / Violence:No  Access to Firearms a concern: No  Gang Involvement:No   Subjective:   Kenneth Daniel participated from office, located at [facility] with Clinician present. Kenneth Daniel consented to treatment.   Kenneth Daniel presented in a positive mood for his session. Patient identified that day by day he is feeling better. Patient reported that he recently returned from a trip to [place] and enjoyed time with his friends. Patient stated that he continues to follow medical advise for his stomach issues and feels much better knowing that the appropriate situation is getting treatment. Clinician and patient discussed engaging in community activities to allow patient the opportunity to meet people in the  area and to build a social circle locally. Patient is doing the cooking classes at [employer] and going to the gym and is interested in doing community volunteer work.     Interventions: Cognitive Behavioral Therapy and Motivational Interviewing  Diagnosis: Adjustment Disorder with mixed anxiety and  depression   Plan: Kenneth Daniel is to use CBT, mindfulness and coping skills to help manage decrease symptoms associated with their diagnosis.   Long-term goal:   Kenneth Daniel will reduce overall level, frequency, and intensity of the feelings of depression and anxiety evidenced by       decreased irritability, negative self talk, and helpless feelings from 6 to 7 days/week to 0 to 2 days/week per client report for at least 3 consecutive months. Treatment plan to be reviewed by 12/28/2023.  Short-term goal:  Kenneth Daniel will verbally express understanding of the relationship between feelings of depression, anxiety and their impact on thinking patterns and behaviors. Verbalize an understanding of the role that distorted thinking plays in creating fears, excessive worry, and ruminations.  Phyllis Ginger MSW, LCSW DATE: 02/15/2023

## 2023-02-18 ENCOUNTER — Ambulatory Visit (INDEPENDENT_AMBULATORY_CARE_PROVIDER_SITE_OTHER): Payer: Medicare PPO | Admitting: *Deleted

## 2023-02-18 DIAGNOSIS — J309 Allergic rhinitis, unspecified: Secondary | ICD-10-CM | POA: Diagnosis not present

## 2023-02-19 DIAGNOSIS — I10 Essential (primary) hypertension: Secondary | ICD-10-CM | POA: Diagnosis not present

## 2023-02-19 DIAGNOSIS — F064 Anxiety disorder due to known physiological condition: Secondary | ICD-10-CM | POA: Diagnosis not present

## 2023-02-19 DIAGNOSIS — K30 Functional dyspepsia: Secondary | ICD-10-CM | POA: Diagnosis not present

## 2023-02-22 DIAGNOSIS — K2950 Unspecified chronic gastritis without bleeding: Secondary | ICD-10-CM | POA: Diagnosis not present

## 2023-02-25 ENCOUNTER — Ambulatory Visit (INDEPENDENT_AMBULATORY_CARE_PROVIDER_SITE_OTHER): Payer: Medicare PPO | Admitting: *Deleted

## 2023-02-25 DIAGNOSIS — J309 Allergic rhinitis, unspecified: Secondary | ICD-10-CM

## 2023-02-25 DIAGNOSIS — R9721 Rising PSA following treatment for malignant neoplasm of prostate: Secondary | ICD-10-CM | POA: Diagnosis not present

## 2023-03-01 ENCOUNTER — Ambulatory Visit (HOSPITAL_COMMUNITY)
Admission: RE | Admit: 2023-03-01 | Discharge: 2023-03-01 | Disposition: A | Discharge status: Home / Self Care | Payer: Medicare PPO | Source: Ambulatory Visit | Attending: Urology | Admitting: Urology

## 2023-03-01 DIAGNOSIS — C61 Malignant neoplasm of prostate: Secondary | ICD-10-CM | POA: Diagnosis not present

## 2023-03-01 DIAGNOSIS — N281 Cyst of kidney, acquired: Secondary | ICD-10-CM | POA: Diagnosis not present

## 2023-03-01 DIAGNOSIS — R634 Abnormal weight loss: Secondary | ICD-10-CM | POA: Diagnosis not present

## 2023-03-01 MED ORDER — TECHNETIUM TC 99M MEDRONATE IV KIT
20.0000 | PACK | Freq: Once | INTRAVENOUS | Status: AC | PRN
  Administered 2023-03-01: 20.2 via INTRAVENOUS

## 2023-03-02 ENCOUNTER — Ambulatory Visit (INDEPENDENT_AMBULATORY_CARE_PROVIDER_SITE_OTHER): Payer: Medicare PPO | Admitting: Licensed Clinical Social Worker

## 2023-03-02 DIAGNOSIS — F4323 Adjustment disorder with mixed anxiety and depressed mood: Secondary | ICD-10-CM | POA: Diagnosis not present

## 2023-03-02 NOTE — Progress Notes (Signed)
[place] [facility]al [facility] Counselor/Therapist Progress Note  Patient ID: Kenneth Daniel, MRN: [id number]    Date: 03/02/23  Time Spent: 1012 pm - 1058 pm : 46 Minutes  Treatment Type: Individual Therapy.  Reported Symptoms: Anxiety and depression related to recent illness.  Mental Status Exam: Appearance:  Casual     Behavior: Appropriate  Motor: Normal  Speech/Language:  Clear and Coherent  Affect: Appropriate  Mood: normal  Thought process: normal  Thought content:   WNL  Sensory/Perceptual disturbances:   WNL  Orientation: oriented to person, place, time/date, situation, day of week, month of year, and year  Attention: Good  Concentration: Good  Memory: WNL  Fund of knowledge:  Good  Insight:   Good  Judgment:  Good  Impulse Control: Good   Risk Assessment: Danger to Self:  No Self-injurious Behavior: No Danger to Others: No Duty to Warn:no Physical Aggression / Violence:No  Access to Firearms a concern: No  Gang Involvement:No   Subjective:   Kenneth Daniel participated from office, located at [employer] with Clinician present. Kenneth Daniel consented to treatment.   Patient presented for his session and was pleasant and engaging. Patient identified that he is attempting to be more active. He attends the cooking classes at the [facility] and has began to do chair yoga at the [employer] [facility].  Patient shared that he is feeling better but can identify anxiety still at certain times and reported that he always feels it in his stomach. Clinician explained that the stomach and anxiety are connected because the digestive system contains the second largest number of nerves in the body after the brain. Clinician shared that many scientist call the stomach the second brain.  Interventions: Cognitive Behavioral Therapy  Diagnosis: Adjustment disorder with mixed anxiety and depression.   Plan: Kenneth Daniel  is to use CBT, mindfulness and coping skills to help  manage decrease symptoms associated with their diagnosis.   Long-term goal:   Kenneth Daniel will reduce overall level, frequency, and intensity of the feelings of depression, anxiety evidenced by       decreased irritability, negative self talk, and helpless feelings from 6 to 7 days/week to 0 to 2 days/week per client report for at least 3 consecutive months. Treatment plan to be reviewed by 12/28/2023.  Short-term goal:  Kenneth Daniel will verbally express understanding of the relationship between feelings of depression, anxiety and their impact on thinking patterns and behaviors. Verbalize an understanding of the role that distorted thinking plays in creating fears, excessive worry, and ruminations.  Kenneth Daniel MSW, LCSW DATE: 03/02/2023

## 2023-03-03 ENCOUNTER — Other Ambulatory Visit: Payer: Self-pay | Admitting: Allergy & Immunology

## 2023-03-04 ENCOUNTER — Ambulatory Visit (INDEPENDENT_AMBULATORY_CARE_PROVIDER_SITE_OTHER): Payer: Medicare PPO

## 2023-03-04 DIAGNOSIS — J309 Allergic rhinitis, unspecified: Secondary | ICD-10-CM | POA: Diagnosis not present

## 2023-03-04 DIAGNOSIS — C61 Malignant neoplasm of prostate: Secondary | ICD-10-CM | POA: Diagnosis not present

## 2023-03-04 DIAGNOSIS — C7951 Secondary malignant neoplasm of bone: Secondary | ICD-10-CM | POA: Diagnosis not present

## 2023-03-04 DIAGNOSIS — N281 Cyst of kidney, acquired: Secondary | ICD-10-CM | POA: Diagnosis not present

## 2023-03-11 ENCOUNTER — Ambulatory Visit (INDEPENDENT_AMBULATORY_CARE_PROVIDER_SITE_OTHER): Payer: Medicare PPO

## 2023-03-11 DIAGNOSIS — J309 Allergic rhinitis, unspecified: Secondary | ICD-10-CM | POA: Diagnosis not present

## 2023-03-11 NOTE — Progress Notes (Signed)
VIALS EXP 05-21-23

## 2023-03-12 DIAGNOSIS — J302 Other seasonal allergic rhinitis: Secondary | ICD-10-CM | POA: Diagnosis not present

## 2023-03-15 DIAGNOSIS — J3089 Other allergic rhinitis: Secondary | ICD-10-CM | POA: Diagnosis not present

## 2023-03-16 ENCOUNTER — Ambulatory Visit: Payer: Medicare PPO | Admitting: Licensed Clinical Social Worker

## 2023-03-18 ENCOUNTER — Ambulatory Visit (INDEPENDENT_AMBULATORY_CARE_PROVIDER_SITE_OTHER): Payer: Medicare PPO

## 2023-03-18 DIAGNOSIS — J309 Allergic rhinitis, unspecified: Secondary | ICD-10-CM

## 2023-03-25 ENCOUNTER — Ambulatory Visit (INDEPENDENT_AMBULATORY_CARE_PROVIDER_SITE_OTHER): Payer: Medicare PPO

## 2023-03-25 DIAGNOSIS — J309 Allergic rhinitis, unspecified: Secondary | ICD-10-CM | POA: Diagnosis not present

## 2023-03-26 DIAGNOSIS — L853 Xerosis cutis: Secondary | ICD-10-CM | POA: Diagnosis not present

## 2023-03-29 ENCOUNTER — Ambulatory Visit: Payer: Medicare PPO | Admitting: Licensed Clinical Social Worker

## 2023-03-29 DIAGNOSIS — F4323 Adjustment disorder with mixed anxiety and depressed mood: Secondary | ICD-10-CM | POA: Diagnosis not present

## 2023-03-29 NOTE — Progress Notes (Signed)
[place] Behavioral [facility] Counselor/Therapist Progress Note  Patient ID: Kenneth Daniel, MRN: [id number]    Date: 03/29/23  Time Spent: 0315  pm - 0400 pm : 45 Minutes  Treatment Type: Individual Therapy.  Reported Symptoms: anxiety and depression related to medical issues   Mental Status Exam: Appearance:  Casual     Behavior: Appropriate  Motor: Normal  Speech/Language:  Clear and Coherent  Affect: Appropriate  Mood: normal  Thought process: normal  Thought content:   WNL  Sensory/Perceptual disturbances:   WNL  Orientation: oriented to person, place, time/date, situation, day of week, month of year, and year  Attention: Good  Concentration: Good  Memory: WNL  Fund of knowledge:  Good  Insight:   Good  Judgment:  Good  Impulse Control: Good   Risk Assessment: Danger to Self:  No Self-injurious Behavior: No Danger to Others: No Duty to Warn:no Physical Aggression / Violence:No  Access to Firearms a concern: No  Gang Involvement:No   Subjective:   Kenneth Daniel participated from office, located at [facility] with Clinician present. Kenneth Daniel consented to treatment.    Patient presented for his session reporting that he has been feeling much better. Patient reports that on occasion he has thoughts of what if the stomach issues aren't really healed and he finds himself feeling sad/depressed/scared. Clinician encouraged patient to utilize positive re framing to assist with his negative thoughts.   Interventions: Cognitive Behavioral Therapy  Diagnosis: Adjustment Disorder with mixed anxiety and depressed mood.   LONG-TERM GOAL:  Kenneth Daniel will develop the ability to recognize, accept and cope with feelings of depression and anxiety.     Short-Term Goal / Objective:  Kenneth Daniel  will replace negative and self-defeating self talk with verbalization of realistic and positive cognitive  Messages.   Treatment plan to be reviewed by 12/28/2023.  Therapist will  provide emotional support and encouragement, to help Kenneth Daniel to focus on sources  of pleasure and meaning. Progress will be monitored and documented.  ---------------------- Good progress in reaching these goals and resolving problems seemed apparent today.  Recommend continuing the current intervention and short-term goals as they exist, since progress is being made but  goals have not yet been met.   Phyllis Ginger MSW, LCSW DATE: 03/29/2023

## 2023-03-31 ENCOUNTER — Ambulatory Visit: Payer: Medicare PPO | Admitting: Sports Medicine

## 2023-03-31 ENCOUNTER — Ambulatory Visit (INDEPENDENT_AMBULATORY_CARE_PROVIDER_SITE_OTHER): Payer: Medicare PPO

## 2023-03-31 DIAGNOSIS — R531 Weakness: Secondary | ICD-10-CM | POA: Diagnosis not present

## 2023-03-31 DIAGNOSIS — L218 Other seborrheic dermatitis: Secondary | ICD-10-CM | POA: Diagnosis not present

## 2023-03-31 DIAGNOSIS — I10 Essential (primary) hypertension: Secondary | ICD-10-CM | POA: Diagnosis not present

## 2023-03-31 DIAGNOSIS — J309 Allergic rhinitis, unspecified: Secondary | ICD-10-CM | POA: Diagnosis not present

## 2023-03-31 DIAGNOSIS — C61 Malignant neoplasm of prostate: Secondary | ICD-10-CM | POA: Diagnosis not present

## 2023-03-31 DIAGNOSIS — E559 Vitamin D deficiency, unspecified: Secondary | ICD-10-CM | POA: Diagnosis not present

## 2023-03-31 DIAGNOSIS — R609 Edema, unspecified: Secondary | ICD-10-CM | POA: Diagnosis not present

## 2023-04-07 ENCOUNTER — Ambulatory Visit (INDEPENDENT_AMBULATORY_CARE_PROVIDER_SITE_OTHER): Payer: Medicare PPO | Admitting: Sports Medicine

## 2023-04-07 ENCOUNTER — Encounter: Payer: Self-pay | Admitting: Sports Medicine

## 2023-04-07 VITALS — BP 134/80 | HR 84 | Temp 96.3°F | Resp 16 | Ht 74.0 in | Wt 164.2 lb

## 2023-04-07 DIAGNOSIS — J3089 Other allergic rhinitis: Secondary | ICD-10-CM | POA: Diagnosis not present

## 2023-04-07 DIAGNOSIS — I10 Essential (primary) hypertension: Secondary | ICD-10-CM

## 2023-04-07 DIAGNOSIS — I672 Cerebral atherosclerosis: Secondary | ICD-10-CM | POA: Diagnosis not present

## 2023-04-07 DIAGNOSIS — D72819 Decreased white blood cell count, unspecified: Secondary | ICD-10-CM

## 2023-04-07 DIAGNOSIS — R634 Abnormal weight loss: Secondary | ICD-10-CM | POA: Diagnosis not present

## 2023-04-07 DIAGNOSIS — Z113 Encounter for screening for infections with a predominantly sexual mode of transmission: Secondary | ICD-10-CM

## 2023-04-07 DIAGNOSIS — K5900 Constipation, unspecified: Secondary | ICD-10-CM

## 2023-04-07 DIAGNOSIS — K2950 Unspecified chronic gastritis without bleeding: Secondary | ICD-10-CM

## 2023-04-07 DIAGNOSIS — Z23 Encounter for immunization: Secondary | ICD-10-CM | POA: Diagnosis not present

## 2023-04-07 NOTE — Progress Notes (Signed)
Careteam: Patient Care Team: Venita Sheffield, MD as PCP - General (Internal Medicine) Venita Sheffield, MD as PCP - Internal Medicine (Internal Medicine)  PLACE OF SERVICE:  [facility]  Advanced Directive information Does Patient Have a Medical Advance Directive?: Yes, Would patient like information on creating a medical advance directive?: No - Patient declined, Type of Advance Directive: Healthcare Power of [place];Living will, Does patient want to make changes to medical advance directive?: No - Patient declined  No Known Allergies  Chief Complaint  Patient presents with   Medical Management of Chronic Issues    3 month follow up.    Immunizations    Discuss the need for Covid Booster, and Pne vaccine.    Health Maintenance    Discuss the need for AWV, and Hepatitis C Screening.      HPI: Patient is a 77 y.o. male  is here for follow up Pt states that he is doing much better States he can eat better   GERD  Followed with [facility] physicians Had EGD and colonoscopy  Need records from GI On omeprazole bid and sucralfate   H pylori neg     Latest Ref Rng & Units 12/31/2022   10:31 PM 12/29/2022   10:52 AM 12/17/2022   10:04 PM  CBC  WBC 4.0 - 10.5 K/uL 3.2  3.4  4.0   Hemoglobin 13.0 - 17.0 g/dL 95.2  84.1  32.4   Hematocrit 39.0 - 52.0 % 38.3  39.9  38.2   Platelets 150 - 400 K/uL 218  233  208      Weight loss Appetite improved Filed Weights   04/07/23 1123  Weight: 164 lb 3.2 oz (74.5 kg)    GAD  Pt stopped taking celexa  As he is doing better  Renal cyst  2. 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 Lesion measures 2.5 x 2.4 cm today compared to 7.0 x 6.8 cm previously. Lesion is somewhat ill-defined with diffusely increased attenuation above simple fluid density. Continued follow-up likely warranted to monitor evolution of the lesion. MRI with and  without contrast could be considered to further evaluate as clinically warranted. Denies  hematuria, abdominal pain  Follows with urology     No acute intracranial abnormalities. 2. Chronic small vessel ischemic disease. Takes aspirin    Allergic rhinitis follows with  Follows with allergy clinic Receives weekly injections     Review of Systems:  Review of Systems  Constitutional:  Negative for chills and fever.  HENT:  Negative for congestion and sore throat.   Eyes:  Negative for double vision.  Respiratory:  Negative for cough, sputum production and shortness of breath.   Cardiovascular:  Negative for chest pain, palpitations and leg swelling.  Gastrointestinal:  Negative for abdominal pain, heartburn and nausea.  Genitourinary:  Negative for dysuria, frequency and hematuria.  Musculoskeletal:  Negative for falls and myalgias.  Neurological:  Negative for dizziness, sensory change and focal weakness.   Negative unless indicated in HPI.   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, right    per last duplex in epic 06-04-2020  right ICA 16-49% and right ECA >50%   Chronic constipation    GERD (gastroesophageal reflux disease)    History of acute pyelonephritis 09/2020   w/ admission in epic due to severe sepsis   History of  COVID-19 11/18/2020   positive result in epic;   per pt mild to moderate symptoms that resolved   History of external beam radiation therapy    completed in 2015 for recurrent prostate cancer (done in [facility])   History of prostate cancer    (current urologist-- dr Benancio Deeds, and current treatment lupron injeciton q 6months) ;   per pt in [place], [place] 2004  s/p radical prostatectomy;   recurrence 2015 completed radiation   HLD (hyperlipidemia)    Hypertension    followed by pcp and cardiology---   (05-29-2020  nuclear stress test in epic , low risk no ischemia nuclear ef 63%)   Inguinal  hernia, right    Non-rheumatic aortic regurgitation    mild to moderate AR without stenosis per last echo in epic 02-27-2021   Perennial allergic rhinitis    followed by dr gollager ([facility])   Wears dentures    upper   Wears glasses    Wears hearing aid in both ears    Past Surgical History:  Procedure Laterality Date   ANTERIOR CERVICAL DECOMP/DISCECTOMY FUSION N/A 11/09/2019   Procedure: ANTERIOR CERVICAL DECOMPRESSION FUSION CERVICAL 3-4 WITH INSTRUMENTATION AND ALLOGRAFT;  Surgeon: Estill Bamberg, MD;  Location: [facility] OR;  Service: Orthopedics;  Laterality: N/A;   CARDIAC CATHETERIZATION  2002   in [place], [place];   per pt stress test with possible ischemia,  told arteries normal   COLONOSCOPY  2020   CYST EXCISION  2005   per pt from back , was benign   INGUINAL HERNIA REPAIR Right 06/04/2021   Procedure: OPEN RIGHT INGUINAL HERNIA REPAIR;  Surgeon: Berna Bue, MD;  Location: [facility];  Service: General;  Laterality: Right;   PROSTATECTOMY  2004   in [place], [place]   UPPER GI ENDOSCOPY  2021   Social History:   reports that he quit smoking about 51 years ago. His smoking use included cigarettes. He started smoking about 55 years ago. He has never used smokeless tobacco. He reports that he does not drink alcohol and does not use drugs.  Family History  Problem Relation Age of Onset   Pancreatic cancer Mother    Prostate cancer Father     Medications: Patient's Medications  New Prescriptions   No medications on file  Previous Medications   ACETAMINOPHEN (TYLENOL) 500 MG TABLET    Take 1,000 mg by mouth every 8 (eight) hours as needed for moderate pain.   ALPRAZOLAM (XANAX) 0.25 MG TABLET    Take 0.25 mg by mouth daily as needed.   ALUMINUM HYDROXIDE-MAGNESIUM CARBONATE (GAVISCON) 95-358 MG/15ML SUSP    Take 30 mLs by mouth as needed for indigestion or heartburn.   AMLODIPINE (NORVASC) 10 MG TABLET    Take 10 mg by mouth daily.   ASCORBIC ACID  (VITAMIN C) 500 MG TABLET    Take 500 mg by mouth daily.   ASPIRIN 81 MG EC TABLET    Take 81 mg by mouth at bedtime.   ATORVASTATIN (LIPITOR) 40 MG TABLET    Take 40 mg by mouth at bedtime.   AZELASTINE HCL 137 MCG/SPRAY SOLN    PLACE 2 SPRAYS INTO BOTH NOSTRILS 2 (TWO) TIMES DAILY   B COMPLEX VITAMINS TABLET    Take 1 tablet by mouth daily as needed.   CHOLECALCIFEROL (VITAMIN D3) 25 MCG (1000 UNIT) TABLET    Take 1,000 Units by mouth daily.   CITALOPRAM (CELEXA) 20 MG TABLET  Take 20 mg by mouth daily.   COENZYME Q10-LEVOCARNITINE (CO Q-10 PLUS PO)    Take 1 tablet by mouth daily at 12 noon.   DESLORATADINE (CLARINEX) 5 MG TABLET    TAKE 1 TABLET BY MOUTH 2 TIMES DAILY AS NEEDED.   DICLOFENAC SODIUM (VOLTAREN) 1 % GEL    Apply 4 g topically 4 (four) times daily.   DIGESTIVE ENZYMES CAPS    Take one 20 minutes before meals   ECHINACEA EXTRACT PO    Take 1 capsule by mouth daily as needed (cold symptoms).   EPINEPHRINE 0.3 MG/0.3 ML IJ SOAJ INJECTION    Use as directed for life threatening allergic reactions   ESOMEPRAZOLE (NEXIUM) 40 MG CAPSULE    Take 1 capsule (40 mg total) by mouth daily.   FAMOTIDINE (PEPCID) 40 MG TABLET    Take 40 mg by mouth daily.   FARXIGA 10 MG TABS TABLET    Take 10 mg by mouth daily.   FLUTICASONE (FLONASE) 50 MCG/ACT NASAL SPRAY    Place 2 sprays into both nostrils daily.   HYDROXYZINE (VISTARIL) 25 MG CAPSULE    Take 1 capsule (25 mg total) by mouth every 8 (eight) hours as needed for anxiety.   MAGNESIUM 250 MG TABS    Take 250 mg by mouth at bedtime.   MELATONIN 5 MG CAPS    Take 10 mg by mouth at bedtime.   NEBIVOLOL (BYSTOLIC) 10 MG TABLET    TAKE 1 TABLET BY MOUTH EVERY DAY IN THE MORNING   NUTRITIONAL SUPPLEMENTS (JUICE PLUS FIBRE PO)    Take 2 capsules by mouth daily.   OMEPRAZOLE (PRILOSEC) 40 MG CAPSULE    Take 1 capsule (40 mg total) by mouth in the morning and at bedtime.   POLYETHYLENE GLYCOL 3350 (MIRALAX PO)    Take by mouth daily.   PROBIOTIC  PRODUCT (PROBIOTIC DAILY PO)    Take 1 capsule by mouth daily.   SUCRALFATE (CARAFATE) 1 G TABLET    Take 1 tablet (1 g total) by mouth 3 (three) times daily.   SUCRALFATE (CARAFATE) 1 GM/10ML SUSPENSION    SMARTSIG:2 Teaspoon By Mouth 4 Times Daily   TRIAMCINOLONE CREAM (KENALOG) 0.1 %    Apply 1 application. topically 2 (two) times daily.   VALSARTAN-HYDROCHLOROTHIAZIDE (DIOVAN-HCT) 320-12.5 MG TABLET    Take 1 tablet by mouth daily.   WHEAT DEXTRIN (BENEFIBER PO)    Take by mouth 3 (three) times daily.   XTANDI 40 MG TABLET    Take 80 mg by mouth 2 (two) times daily.  Modified Medications   No medications on file  Discontinued Medications   No medications on file    Physical Exam: Vitals:   04/07/23 1123  BP: 134/80  Pulse: 84  Resp: 16  Temp: (!) 96.3 F (35.7 C)  SpO2: 97%  Weight: 164 lb 3.2 oz (74.5 kg)  Height: 6\' 2"  (1.88 m)   Body mass index is 21.08 kg/m. BP Readings from Last 3 Encounters:  04/07/23 134/80  02/09/23 (!) 166/83  01/01/23 (!) 154/88   Wt Readings from Last 3 Encounters:  04/07/23 164 lb 3.2 oz (74.5 kg)  12/31/22 178 lb 9.2 oz (81 kg)  12/29/22 167 lb (75.8 kg)    Physical Exam Constitutional:      Appearance: Normal appearance.  HENT:     Head: Normocephalic and atraumatic.  Cardiovascular:     Rate and Rhythm: Normal rate and regular rhythm.  Pulses: Normal pulses.     Heart sounds: Normal heart sounds.  Pulmonary:     Effort: No respiratory distress.     Breath sounds: No stridor. No wheezing or rales.  Abdominal:     General: Bowel sounds are normal. There is no distension.     Palpations: Abdomen is soft.     Tenderness: There is no abdominal tenderness. There is no right CVA tenderness or guarding.  Musculoskeletal:        General: No swelling.  Neurological:     Mental Status: He is alert. Mental status is at baseline.     Sensory: No sensory deficit.     Motor: No weakness.     Labs reviewed: Basic Metabolic  Panel: Recent Labs    11/27/22 1318 12/17/22 2204 12/29/22 1052 12/31/22 2231  NA 136 133*  --  136  K 3.8 3.6  --  3.8  CL 99 98  --  97*  CO2 26 24  --  24  GLUCOSE 94 108*  --  104*  BUN 13 12  --  10  CREATININE 1.03 1.03  --  0.90  CALCIUM 9.4 8.8*  --  9.6  TSH  --   --  1.14  --    Liver Function Tests: Recent Labs    11/27/22 1318 12/17/22 2204 12/31/22 2231  AST 27 23 22   ALT 30 34 34  ALKPHOS 50 47 48  BILITOT 0.8 0.5 0.9  PROT 7.4 7.1 7.3  ALBUMIN 3.7 3.6 3.7   Recent Labs    11/27/22 1318 12/17/22 2204  LIPASE 44 41   No results for input(s): "AMMONIA" in the last 8760 hours. CBC: Recent Labs    11/27/22 1318 12/17/22 2204 12/29/22 1052 12/31/22 2231  WBC 2.6* 4.0 3.4* 3.2*  NEUTROABS 1.6*  --  2,135  --   HGB 12.9* 12.5* 13.0* 12.5*  HCT 39.3 38.2* 39.9 38.3*  MCV 84.9 85.8 86.7 85.1  PLT 187 208 233 218   Lipid Panel: No results for input(s): "CHOL", "HDL", "LDLCALC", "TRIG", "CHOLHDL", "LDLDIRECT" in the last 8760 hours. TSH: Recent Labs    12/29/22 1052  TSH 1.14   A1C: Lab Results  Component Value Date   HGBA1C 6.2 (H) 12/29/2022     Assessment/Plan  1. 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 Leukopenia, unspecified type  Will check cbc - CBC With Differential/Platelet  3. Screening for STD (sexually transmitted disease)   - Hepatitis C antibody  4. Constipation, unspecified constipation type  Increase oral hydration and fiber intake  Take colace prn   5. Perennial allergic rhinitis Follow up allergy clinic    6. Weight loss  Will monitor  Will need records from GI reg EGD, colonoscopy  7. Primary hypertension  Bp at goal  Cont with amlodipine  8. Atherosclerotic cerebrovascular disease  No acute intracranial abnormalities. 2. Chronic small vessel ischemic disease. Takes aspirin Will check  lipid panel - Lipid Panel  9. Need for pneumococcal vaccine  - Pneumococcal conjugate vaccine 20-valent (Prevnar 20)  Other orders - FARXIGA 10 MG TABS tablet; Take 10 mg by mouth daily. - Wheat Dextrin (BENEFIBER PO); Take by mouth 3 (three) times daily.   No follow-ups on file.:

## 2023-04-08 ENCOUNTER — Ambulatory Visit: Payer: Self-pay | Admitting: *Deleted

## 2023-04-09 ENCOUNTER — Ambulatory Visit (INDEPENDENT_AMBULATORY_CARE_PROVIDER_SITE_OTHER): Payer: Self-pay | Admitting: *Deleted

## 2023-04-09 DIAGNOSIS — J309 Allergic rhinitis, unspecified: Secondary | ICD-10-CM | POA: Diagnosis not present

## 2023-04-20 ENCOUNTER — Ambulatory Visit: Payer: Medicare PPO | Admitting: Licensed Clinical Social Worker

## 2023-04-26 ENCOUNTER — Other Ambulatory Visit: Payer: Self-pay | Admitting: Sports Medicine

## 2023-04-26 ENCOUNTER — Other Ambulatory Visit: Payer: Self-pay | Admitting: Cardiology

## 2023-04-26 ENCOUNTER — Other Ambulatory Visit: Payer: Self-pay | Admitting: Internal Medicine

## 2023-04-26 DIAGNOSIS — I10 Essential (primary) hypertension: Secondary | ICD-10-CM

## 2023-04-26 DIAGNOSIS — R5383 Other fatigue: Secondary | ICD-10-CM

## 2023-04-29 NOTE — Telephone Encounter (Signed)
 Patient medication has warnings. Medication pend and sent to PCP Venita Sheffield, MD

## 2023-04-30 ENCOUNTER — Encounter: Payer: Self-pay | Admitting: Family

## 2023-04-30 ENCOUNTER — Ambulatory Visit (INDEPENDENT_AMBULATORY_CARE_PROVIDER_SITE_OTHER): Payer: Medicare PPO | Admitting: Family

## 2023-04-30 VITALS — BP 132/84 | HR 74 | Temp 98.2°F | Resp 20 | Ht 74.0 in

## 2023-04-30 DIAGNOSIS — R140 Abdominal distension (gaseous): Secondary | ICD-10-CM

## 2023-04-30 DIAGNOSIS — R432 Parageusia: Secondary | ICD-10-CM | POA: Diagnosis not present

## 2023-04-30 LAB — POC COVID19 BINAXNOW: SARS Coronavirus 2 Ag: NEGATIVE

## 2023-04-30 MED ORDER — SIMETHICONE 80 MG PO CHEW: 80.0000 mg | CHEWABLE_TABLET | Freq: Four times a day (QID) | ORAL | 0 refills | Status: DC | PRN

## 2023-04-30 NOTE — Patient Instructions (Signed)
-   Notify provider if symptoms worsen or fail to improve  °

## 2023-04-30 NOTE — Progress Notes (Unsigned)
Provider: Zaliah Wissner FNP-C  Venita Sheffield, MD  Patient Care Team: Venita Sheffield, MD as PCP - General (Internal Medicine) Venita Sheffield, MD as PCP - Internal Medicine (Internal Medicine)  Extended Emergency Contact Information Primary Emergency Contact: Hoffman,Betty Mobile Phone: [phone] Relation: Sister Preferred language: English Interpreter needed? No  Code Status:  Full Code  Goals of care: Advanced Directive information    04/07/2023   11:26 AM  Advanced Directives  Does Patient Have a Medical Advance Directive? Yes  Type of Estate agent of [place];Living will  Does patient want to make changes to medical advance directive? No - Patient declined  Copy of Healthcare Power of Attorney in Chart? No - copy requested  Would patient like information on creating a medical advance directive? No - Patient declined     Chief Complaint  Patient presents with   Acute Visit    Cough     HPI:  Pt is a 77 y.o. male seen today for an acute visit for evaluation loss of taste,headaches Took COVID-19 twice yesterday and once this morning and all was negative. States recovering from gastric from H.Pyloric.Has been taking Sucrafate ,Omeprazole   He was told after colonoscopy that it will take 6-8 weeks.  Doing well with fish,marsh potatoes and rice.Hard salmon ,marsh potatoes and Green beans but later on in the evening he was still hungry.Also had a good breakfast.Has to eat something  Whenever his belt is too tight he has headache over 45 minutes.  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)   History of prostate cancer    (current urologist-- dr Benancio Deeds, and current treatment lupron injeciton q 6months) ;   per pt in [place], [place] 2004  s/p radical prostatectomy;   recurrence 2015 completed radiation   HLD (hyperlipidemia)    Hypertension    followed by pcp and cardiology---   (05-29-2020  nuclear stress test in epic , low risk no ischemia nuclear ef 63%)   Inguinal hernia, right    Non-rheumatic aortic regurgitation    mild to moderate AR without stenosis per last echo in epic 02-27-2021   Perennial allergic rhinitis    [facility]   Wears dentures    upper   Wears glasses    Wears hearing aid in both ears    Past Surgical History:  Procedure Laterality Date   ANTERIOR CERVICAL DECOMP/DISCECTOMY FUSION N/A 11/09/2019   Procedure: ANTERIOR CERVICAL DECOMPRESSION FUSION CERVICAL 3-4 WITH INSTRUMENTATION AND ALLOGRAFT;  Surgeon: Estill Bamberg, MD;  Location: [facility] OR;  Service: Orthopedics;  Laterality: N/A;   CARDIAC CATHETERIZATION  2002   in [place], [place];   per pt stress test with possible ischemia,  told arteries normal   COLONOSCOPY  2020   CYST EXCISION  2005  per pt from back , was benign   INGUINAL HERNIA REPAIR Right 06/04/2021   Procedure: OPEN RIGHT INGUINAL HERNIA REPAIR;  Surgeon: Berna Bue, MD;  Location: [facility];  Service: General;  Laterality: Right;   PROSTATECTOMY  2004   in [place], [place]   UPPER GI ENDOSCOPY  2021    No Known Allergies  Outpatient Encounter Medications as of 04/30/2023  Medication Sig   acetaminophen (TYLENOL) 500 MG tablet Take 1,000 mg by mouth every 8 (eight) hours as needed for moderate pain.   ALPRAZolam (XANAX) 0.25  MG tablet Take 0.25 mg by mouth daily as needed.   aluminum hydroxide-magnesium carbonate (GAVISCON) 95-358 MG/15ML SUSP Take 30 mLs by mouth as needed for indigestion or heartburn.   amLODipine (NORVASC) 10 MG tablet Take 10 mg by mouth daily.   ascorbic acid (VITAMIN C) 500 MG tablet Take 500 mg by mouth daily.   aspirin 81 MG EC tablet Take 81 mg by mouth at bedtime.   atorvastatin (LIPITOR) 40 MG tablet Take 40 mg by mouth at bedtime.   Azelastine HCl 137 MCG/SPRAY SOLN PLACE 2 SPRAYS INTO THE NOSE 2 (TWO) TIMES DAILY AS NEEDED.   b complex vitamins tablet Take 1 tablet by mouth daily as needed.   cholecalciferol (VITAMIN D3) 25 MCG (1000 UNIT) tablet Take 1,000 Units by mouth daily.   citalopram (CELEXA) 20 MG tablet Take 20 mg by mouth daily.   Coenzyme Q10-levOCARNitine (CO Q-10 PLUS PO) Take 1 tablet by mouth daily at 12 noon.   desloratadine (CLARINEX) 5 MG tablet TAKE 1 TABLET BY MOUTH 2 TIMES DAILY AS NEEDED.   diclofenac Sodium (VOLTAREN) 1 % GEL Apply 4 g topically 4 (four) times daily.   Digestive Enzymes CAPS Take one 20 minutes before meals   ECHINACEA EXTRACT PO Take 1 capsule by mouth daily as needed (cold symptoms).   EPINEPHrine 0.3 mg/0.3 mL IJ SOAJ injection Use as directed for life threatening allergic reactions   esomeprazole (NEXIUM) 40 MG capsule Take 1 capsule (40 mg total) by mouth daily.   famotidine (PEPCID) 40 MG tablet Take 40 mg by mouth daily.   FARXIGA 10 MG TABS tablet Take 10 mg by mouth daily.   fluticasone (FLONASE) 50 MCG/ACT nasal spray Place 2 sprays into both nostrils daily.   hydrOXYzine (VISTARIL) 25 MG capsule Take 1 capsule (25 mg total) by mouth every 8 (eight) hours as needed for anxiety.   Magnesium 250 MG TABS Take 250 mg by mouth at bedtime.   Melatonin 5 MG CAPS Take 10 mg by mouth at bedtime.   nebivolol (BYSTOLIC) 10 MG tablet TAKE 1 TABLET BY MOUTH EVERY DAY IN THE MORNING   Nutritional Supplements (JUICE PLUS FIBRE PO) Take 2 capsules  by mouth daily.   omeprazole (PRILOSEC) 40 MG capsule Take 1 capsule (40 mg total) by mouth in the morning and at bedtime.   Polyethylene Glycol 3350 (MIRALAX PO) Take by mouth daily.   Probiotic Product (PROBIOTIC DAILY PO) Take 1 capsule by mouth daily.   sucralfate (CARAFATE) 1 g tablet TAKE 1 TABLET BY MOUTH 3 TIMES DAILY.   sucralfate (CARAFATE) 1 GM/10ML suspension SMARTSIG:2 Teaspoon By Mouth 4 Times Daily   triamcinolone cream (KENALOG) 0.1 % Apply 1 application. topically 2 (two) times daily.   valsartan-hydrochlorothiazide (DIOVAN-HCT) 320-12.5 MG tablet Take 1 tablet by mouth daily.   Wheat Dextrin (BENEFIBER PO) Take by mouth 3 (three) times daily.   XTANDI 40 MG tablet  Take 80 mg by mouth 2 (two) times daily.   No facility-administered encounter medications on file as of 04/30/2023.    Review of Systems  Immunization History  Administered Date(s) Administered   Influenza, High Dose Seasonal PF 01/27/2022, 01/31/2023   PFIZER(Purple Top)SARS-COV-2 Vaccination 02/16/2020, 08/12/2020, 02/20/2021   PNEUMOCOCCAL CONJUGATE-20 04/07/2023   Respiratory Syncytial Virus Vaccine,Recomb Aduvanted(Arexvy) 01/13/2022   Td 07/25/2020   Tdap 07/25/2020   Unspecified SARS-COV-2 Vaccination 02/16/2022, 01/21/2023   Zoster Recombinant(Shingrix) 07/25/2020, 10/24/2020   Pertinent  Health Maintenance Due  Topic Date Due   INFLUENZA VACCINE  Completed   Colonoscopy  Discontinued      09/20/2021    1:12 PM 04/02/2022    7:52 PM 12/29/2022   10:07 AM 12/29/2022   10:18 AM 04/07/2023   11:26 AM  Fall Risk  Falls in the past year?   0 0 0  Was there an injury with Fall?   0 0 0  Fall Risk Category Calculator   0 0 0  (RETIRED) Patient Fall Risk Level Low fall risk Low fall risk     Patient at Risk for Falls Due to   No Fall Risks No Fall Risks No Fall Risks  Fall risk Follow up   Falls evaluation completed Falls evaluation completed Falls evaluation completed;Education provided;Falls  prevention discussed   Functional Status Survey:    Vitals:   04/30/23 1402  BP: 132/84  Pulse: 74  Resp: 20  Temp: 98.2 F (36.8 C)  SpO2: 97%  Height: 6\' 2"  (1.88 m)   Body mass index is 21.08 kg/m. Physical Exam  Labs reviewed: Recent Labs    11/27/22 1318 12/17/22 2204 12/31/22 2231  NA 136 133* 136  K 3.8 3.6 3.8  CL 99 98 97*  CO2 26 24 24   GLUCOSE 94 108* 104*  BUN 13 12 10   CREATININE 1.03 1.03 0.90  CALCIUM 9.4 8.8* 9.6   Recent Labs    11/27/22 1318 12/17/22 2204 12/31/22 2231  AST 27 23 22   ALT 30 34 34  ALKPHOS 50 47 48  BILITOT 0.8 0.5 0.9  PROT 7.4 7.1 7.3  ALBUMIN 3.7 3.6 3.7   Recent Labs    11/27/22 1318 12/17/22 2204 12/29/22 1052 12/31/22 2231  WBC 2.6* 4.0 3.4* 3.2*  NEUTROABS 1.6*  --  2,135  --   HGB 12.9* 12.5* 13.0* 12.5*  HCT 39.3 38.2* 39.9 38.3*  MCV 84.9 85.8 86.7 85.1  PLT 187 208 233 218   Lab Results  Component Value Date   TSH 1.14 12/29/2022   Lab Results  Component Value Date   HGBA1C 6.2 (H) 12/29/2022   No results found for: "CHOL", "HDL", "LDLCALC", "LDLDIRECT", "TRIG", "CHOLHDL"  Significant Diagnostic Results in last 30 days:  No results found.  Assessment/Plan 1. Cough, unspecified type (Primary) *** - POC COVID-19    Family/ staff Communication: Reviewed plan of care with patient  Labs/tests ordered: None   Next Appointment:   Caesar Bookman, NP

## 2023-05-03 DIAGNOSIS — F32A Depression, unspecified: Secondary | ICD-10-CM | POA: Diagnosis not present

## 2023-05-03 DIAGNOSIS — E785 Hyperlipidemia, unspecified: Secondary | ICD-10-CM | POA: Diagnosis not present

## 2023-05-03 DIAGNOSIS — M130 Polyarthritis, unspecified: Secondary | ICD-10-CM | POA: Diagnosis not present

## 2023-05-03 DIAGNOSIS — I10 Essential (primary) hypertension: Secondary | ICD-10-CM | POA: Diagnosis not present

## 2023-05-03 DIAGNOSIS — F4381 Prolonged grief disorder: Secondary | ICD-10-CM | POA: Diagnosis not present

## 2023-05-03 DIAGNOSIS — F064 Anxiety disorder due to known physiological condition: Secondary | ICD-10-CM | POA: Diagnosis not present

## 2023-05-08 ENCOUNTER — Other Ambulatory Visit: Payer: Self-pay | Admitting: Allergy & Immunology

## 2023-05-10 ENCOUNTER — Ambulatory Visit (INDEPENDENT_AMBULATORY_CARE_PROVIDER_SITE_OTHER): Payer: Medicare PPO | Admitting: Licensed Clinical Social Worker

## 2023-05-10 DIAGNOSIS — F4323 Adjustment disorder with mixed anxiety and depressed mood: Secondary | ICD-10-CM | POA: Diagnosis not present

## 2023-05-10 NOTE — Progress Notes (Signed)
 [facility] Counselor/Therapist Progress Note  Patient ID: Kenneth Daniel, MRN: [id number]    Date: 05/10/23  Time Spent: 0115  pm - 0200 pm : 45 Minutes  Treatment Type: Individual Therapy.  Reported Symptoms: Patient reports an increase in anxiety and depression related to his current anxiety.  Mental Status Exam: Appearance:  Casual     Behavior: Appropriate  Motor: Normal  Speech/Language:  Clear and Coherent  Affect: Depressed  Mood: anxious  Thought process: normal  Thought content:   WNL  Sensory/Perceptual disturbances:   WNL  Orientation: oriented to person, place, time/date, situation, day of week, month of year, and year  Attention: Good  Concentration: Good  Memory: WNL  Fund of knowledge:  Good  Insight:   Good  Judgment:  Good  Impulse Control: Good   Risk Assessment: Danger to Self:  No Self-injurious Behavior: No Danger to Others: No Duty to Warn:no Physical Aggression / Violence:No  Access to Firearms a concern: No  Gang Involvement:No   Subjective:   Kenneth Daniel participated from office, located at [employer] with Clinician present. [name] consented to treatment.   Kenneth Daniel presented for his session identifying that he is planning a trip to [place]  via plane to visit his friends. Patient reports that he has been having this anxiety about flying and traveling again. Patient reports not knowing why he feels this way but since he got sick last summer he has struggled with an increase in anxiety.  Patient reports that he has noticed he also carries his anxiety in his stomach. Patient and Clinician processed some of the anxiety he has about traveling and what the triggers may be.   Patient evidenced an increase in his anxiety from our last session. Patient has always been an avid traveler and has always enjoyed traveling both by plane and car and train. Patient and Clinician discussed his fear of when he was sick and his fear of  something serious being wrong. Patient also shared that he has lost many friends this past year due to passing away for many reasons. Patient was abel to identify that at times he lets his mind wonder and think negatively and gets worked up. Clinician provided patient with the 3-3-3 to assist him in calming himself down by naming 3 things he can hear, see, and smell. Patient was eager to utilize this on his flight. We also processed deep breathing exercises, mindfulness meditation, regular exercise, getting enough sleep, challenging negative thoughts, limiting health-related information intake, talking to a healthcare professional, and seeking support from loved ones as ways to cope with his anxiety related to his health. Patient will continue to meet with therapist biweekly, and treatment plan to be  reviewed by 12/28/2023.   Interventions: Cognitive Behavioral Therapy  Diagnosis: Adjustment Disorder with mixed anxiety and depressed mood.    Kenneth Daniel MSW, LCSW/DATE 05/10/2023

## 2023-05-11 ENCOUNTER — Ambulatory Visit (INDEPENDENT_AMBULATORY_CARE_PROVIDER_SITE_OTHER): Payer: Medicare PPO | Admitting: *Deleted

## 2023-05-11 DIAGNOSIS — J309 Allergic rhinitis, unspecified: Secondary | ICD-10-CM

## 2023-05-17 DIAGNOSIS — K219 Gastro-esophageal reflux disease without esophagitis: Secondary | ICD-10-CM | POA: Diagnosis not present

## 2023-05-17 DIAGNOSIS — K5900 Constipation, unspecified: Secondary | ICD-10-CM | POA: Diagnosis not present

## 2023-05-18 ENCOUNTER — Ambulatory Visit: Payer: Medicare PPO | Admitting: Licensed Clinical Social Worker

## 2023-05-20 DIAGNOSIS — C61 Malignant neoplasm of prostate: Secondary | ICD-10-CM | POA: Diagnosis not present

## 2023-05-25 ENCOUNTER — Ambulatory Visit: Payer: Medicare PPO | Admitting: Licensed Clinical Social Worker

## 2023-05-27 ENCOUNTER — Ambulatory Visit (INDEPENDENT_AMBULATORY_CARE_PROVIDER_SITE_OTHER): Payer: Medicare PPO | Admitting: Licensed Clinical Social Worker

## 2023-05-27 DIAGNOSIS — C7951 Secondary malignant neoplasm of bone: Secondary | ICD-10-CM | POA: Diagnosis not present

## 2023-05-27 DIAGNOSIS — F4323 Adjustment disorder with mixed anxiety and depressed mood: Secondary | ICD-10-CM

## 2023-05-27 DIAGNOSIS — C61 Malignant neoplasm of prostate: Secondary | ICD-10-CM | POA: Diagnosis not present

## 2023-05-27 DIAGNOSIS — N281 Cyst of kidney, acquired: Secondary | ICD-10-CM | POA: Diagnosis not present

## 2023-05-29 NOTE — Progress Notes (Unsigned)
[facility] Counselor/Therapist Progress Note  Patient ID: Kenneth Daniel, MRN: [id number]    Date: 05/27/2023  Time Spent: 410  pm - 505 pm : 55 Minutes  Treatment Type: Individual Therapy.  Reported Symptoms: Patient reports anxiety and depression due to a recent illness and his fear of something bad happening. Patient reports having stomach issues that previously left him anxious when traveling which is something he enjoys.  Mental Status Exam: Appearance:  Casual     Behavior: Appropriate  Motor: Normal  Speech/Language:  Clear and Coherent  Affect: Appropriate  Mood: normal  Thought process: normal  Thought content:   WNL  Sensory/Perceptual disturbances:   WNL  Orientation: oriented to person, place, time/date, situation, day of week, month of year, and year  Attention: Good  Concentration: Good  Memory: WNL  Fund of knowledge:  Good  Insight:   Good  Judgment:  Good  Impulse Control: Good   Risk Assessment: Danger to Self:  No Self-injurious Behavior: No Danger to Others: No Duty to Warn:no Physical Aggression / Violence:No  Access to Firearms a concern: No  Gang Involvement:No   Subjective:   [name] participated from office, located at [facility] with Clinician present. [place] consented to treatment.   Cono presented for his session running late. Patient states he continues ot have anxiety about flying to [place] to morrow to visit his friends. Patient states he is attempting to think positive and utilize coping skills such as the 3-3-3 method we discussed and re framing his thoughts. Patient shared that he hasn't flown in a while due to his stomach condition that has negatively impacted him since last year at the same time. Patient also shared information related to a previous cancer diagnosis many years ago. He admits that anytime he feels sick or that something is off with his health, he goes back to thoughts of cancer  returning.  Clinician provided understanding through active listening and verbal interaction. Clinician also encouraged patient to recognize that he keeps up with regular health visits and his medical team is knowledgeable of his medical history, so they can provide him with the best care possible. Clinician also encouraged patient to recognize how that now he has been treated for the stomach issues he has felt better and has not seen an increase in his symptoms. Clinician also encouraged patient to be aware of what has helped him and to utilize the same methods on his flight and his trip. If it has worked before there is no reason it won't work in this situation.   Patient was insightful and would agree with Clinician and acknowledge his need to not over think or catastrophe the  situation. Patient agreed to utilize the 3-3-3 method and re framimg.  GOALS:  Reduce checking behaviors: Decrease the frequency of checking body for signs of illness, like repeatedly checking pulse, temperature, or examining skin for abnormalities.  Limit reassurance seeking: Minimize the need to constantly seek reassurance from others about one's health concerns.  Challenge negative thoughts: Identify and challenge distorted thoughts related to health concerns, replacing them with more realistic interpretations.  Develop coping mechanisms: Learn relaxation techniques like deep breathing, mindfulness, or progressive muscle relaxation to manage anxiety.  Improve self-awareness: Recognize triggers that exacerbate health anxieties and develop strategies to manage them.  Educate about normal bodily sensations: Gain a better understanding of typical bodily functions and sensations to reduce misinterpretation of symptoms.  Engage in healthy lifestyle practices: Prioritize regular exercise,  balanced diet, and sufficient sleep to support overall well-being.  Seek professional support: Collaborate with a therapist,  preferably one specializing in cognitive behavioral therapy (CBT), to address underlying anxieties and develop coping skills.  Specific goal examples: Reduce the number of times checking body for symptoms per day by 50% within 2 months.  Decrease the frequency of seeking reassurance from others regarding health concerns by 75% within 3 months.  Practice relaxation techniques for 15 minutes daily for at least 4 weeks.  Identify and challenge at least 3 negative health-related thoughts per week.  Attend therapy sessions regularly to discuss coping strategies and progress.  Important considerations: Collaboration with healthcare provider: Maintain open communication with your doctor to address any legitimate medical concerns and manage anxiety effectively.  Continue with biweekly therapy. Treatment plan to be reviewed by 12/28/2023   Interventions: Cognitive Behavioral Therapy and Solution-Oriented/Positive Psychology  Diagnosis: Adjustment Disorder with mixed anxiety and depression    Phyllis Ginger MSW, LCSW/DATE 05/27/2023

## 2023-05-31 DIAGNOSIS — F1310 Sedative, hypnotic or anxiolytic abuse, uncomplicated: Secondary | ICD-10-CM | POA: Diagnosis not present

## 2023-05-31 DIAGNOSIS — C61 Malignant neoplasm of prostate: Secondary | ICD-10-CM | POA: Diagnosis not present

## 2023-05-31 DIAGNOSIS — F320 Major depressive disorder, single episode, mild: Secondary | ICD-10-CM | POA: Diagnosis not present

## 2023-05-31 DIAGNOSIS — R7303 Prediabetes: Secondary | ICD-10-CM | POA: Diagnosis not present

## 2023-05-31 DIAGNOSIS — I25119 Atherosclerotic heart disease of native coronary artery with unspecified angina pectoris: Secondary | ICD-10-CM | POA: Diagnosis not present

## 2023-05-31 DIAGNOSIS — I5030 Unspecified diastolic (congestive) heart failure: Secondary | ICD-10-CM | POA: Diagnosis not present

## 2023-05-31 DIAGNOSIS — I110 Hypertensive heart disease with heart failure: Secondary | ICD-10-CM | POA: Diagnosis not present

## 2023-05-31 DIAGNOSIS — J8410 Pulmonary fibrosis, unspecified: Secondary | ICD-10-CM | POA: Diagnosis not present

## 2023-05-31 DIAGNOSIS — Z0001 Encounter for general adult medical examination with abnormal findings: Secondary | ICD-10-CM | POA: Diagnosis not present

## 2023-05-31 DIAGNOSIS — K219 Gastro-esophageal reflux disease without esophagitis: Secondary | ICD-10-CM | POA: Diagnosis not present

## 2023-05-31 DIAGNOSIS — E7800 Pure hypercholesterolemia, unspecified: Secondary | ICD-10-CM | POA: Diagnosis not present

## 2023-06-03 DIAGNOSIS — I10 Essential (primary) hypertension: Secondary | ICD-10-CM | POA: Diagnosis not present

## 2023-06-03 DIAGNOSIS — F064 Anxiety disorder due to known physiological condition: Secondary | ICD-10-CM | POA: Diagnosis not present

## 2023-06-03 DIAGNOSIS — F4381 Prolonged grief disorder: Secondary | ICD-10-CM | POA: Diagnosis not present

## 2023-06-03 DIAGNOSIS — E785 Hyperlipidemia, unspecified: Secondary | ICD-10-CM | POA: Diagnosis not present

## 2023-06-03 DIAGNOSIS — N2889 Other specified disorders of kidney and ureter: Secondary | ICD-10-CM | POA: Diagnosis not present

## 2023-06-07 ENCOUNTER — Ambulatory Visit (HOSPITAL_COMMUNITY): Payer: Medicare PPO | Attending: Cardiology

## 2023-06-07 ENCOUNTER — Other Ambulatory Visit: Payer: Medicare PPO

## 2023-06-07 DIAGNOSIS — I351 Nonrheumatic aortic (valve) insufficiency: Secondary | ICD-10-CM | POA: Insufficient documentation

## 2023-06-07 LAB — ECHOCARDIOGRAM COMPLETE
Area-P 1/2: 2.99 cm2
P 1/2 time: 476 ms
S' Lateral: 2.8 cm

## 2023-06-09 ENCOUNTER — Ambulatory Visit: Payer: Medicare PPO | Admitting: Licensed Clinical Social Worker

## 2023-06-09 ENCOUNTER — Encounter: Payer: Self-pay | Admitting: Cardiology

## 2023-06-09 ENCOUNTER — Ambulatory Visit: Payer: Medicare PPO | Attending: Cardiology | Admitting: Cardiology

## 2023-06-09 VITALS — BP 142/84 | HR 74 | Resp 16 | Ht 74.0 in | Wt 167.8 lb

## 2023-06-09 DIAGNOSIS — I351 Nonrheumatic aortic (valve) insufficiency: Secondary | ICD-10-CM | POA: Diagnosis not present

## 2023-06-09 DIAGNOSIS — Z87891 Personal history of nicotine dependence: Secondary | ICD-10-CM | POA: Diagnosis not present

## 2023-06-09 DIAGNOSIS — E7800 Pure hypercholesterolemia, unspecified: Secondary | ICD-10-CM

## 2023-06-09 DIAGNOSIS — I77810 Thoracic aortic ectasia: Secondary | ICD-10-CM

## 2023-06-09 DIAGNOSIS — F4323 Adjustment disorder with mixed anxiety and depressed mood: Secondary | ICD-10-CM | POA: Diagnosis not present

## 2023-06-09 DIAGNOSIS — I10 Essential (primary) hypertension: Secondary | ICD-10-CM

## 2023-06-09 NOTE — Patient Instructions (Signed)
 Medication Instructions:  Your physician recommends that you continue on your current medications as directed. Please refer to the Current Medication list given to you today.  *If you need a refill on your cardiac medications before your next appointment, please call your pharmacy*  Lab Work: None ordered today. If you have labs (blood work) drawn today and your tests are completely normal, you will receive your results only by: MyChart Message (if you have MyChart) OR A paper copy in the mail If you have any lab test that is abnormal or we need to change your treatment, we will call you to review the results.  Testing/Procedures: CT chest without contrast to be scheduled beginning of February 2026  Follow-Up: At [facility], you and your health needs are our priority.  As part of our continuing mission to provide you with exceptional heart care, we have created designated Provider Care Teams.  These Care Teams include your primary Cardiologist (physician) and Advanced Practice Providers (APPs -  Physician Assistants and Nurse Practitioners) who all work together to provide you with the care you need, when you need it.   Your next appointment:   1 year(s) (mid February 2026)  The format for your next appointment:   In Person  Provider:   Madonna Large, DO {

## 2023-06-09 NOTE — Progress Notes (Signed)
 Cardiology Office Note:  .   Date:  06/09/2023  ID:  Kenneth Daniel, DOB 1946-04-14, MRN [id number] PCP:  Benjamine Aland, MD  Former Cardiology Providers: Dr. Samul [facility])  [facility] Providers Cardiologist:  Madonna Large, DO , [facility] (established care follow-up 05/22/2020) Electrophysiologist:  None  Click to update primary MD,subspecialty MD or APP then REFRESH:1}    Chief Complaint  Patient presents with   Nonrheumatic aortic valve insufficiency   Follow-up    History of Present Illness: .   Kenneth Daniel is a 78 y.o. 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   In the past was referred to the practice for evaluation for reduced functional capacity/physical endurance.  Patient did undergo echocardiogram which noted preserved LVEF with mild to moderate aortic regurgitation.  Stress test was low risk.  And carotid duplex noted disease <50% and follow-up study in 2023 noted improvement in disease burden.  He presents today for 1 year follow-up visit.  Over the last 1 year patient denies any anginal chest pain or heart failure symptoms.  He continues to go to the gym regularly.  He walks on the treadmill at least 25 to 30 minutes daily followed by mild resistance training.  Echocardiogram from 06/07/2023 independently reviewed and noted below for further reference.  Review of Systems: .   Review of Systems  Cardiovascular:  Negative for chest pain, claudication, irregular heartbeat, leg swelling, near-syncope, orthopnea, palpitations, paroxysmal nocturnal dyspnea and syncope.  Respiratory:  Negative for shortness of breath.   Hematologic/Lymphatic: Negative for bleeding problem.     Studies Reviewed:   EKG: EKG Interpretation Date/Time:  Wednesday June 09 2023 09:54:31 EST Ventricular Rate:  71 PR Interval:  204 QRS Duration:  82 QT Interval:  418 QTC Calculation: 454 R Axis:   42  Text Interpretation: Normal sinus rhythm Minimal voltage criteria for LVH, may be normal variant ( Sokolow-Lyon ) When compared with ECG of 21-Nov-2022 11:57, No significant change since last tracing Confirmed by Large Madonna ([id number]) on 06/09/2023 9:57:28 AM  Echocardiogram: June 07, 2023  1. Left ventricular ejection fraction, by estimation, is 60 to 65%. The  left ventricle has normal function. The left ventricle has no regional  wall motion abnormalities. There is mild left ventricular hypertrophy.  Left ventricular diastolic parameters  were normal. The average left ventricular global longitudinal strain is  -17.2 %. The global longitudinal strain is abnormal.   2. Right ventricular systolic function is normal. The right ventricular  size is normal. There is normal pulmonary artery systolic pressure.   3. The mitral valve is normal in structure. No evidence of mitral valve  regurgitation. No evidence of mitral stenosis.   4. The aortic valve is tricuspid. Aortic valve regurgitation is mild to  moderate. No aortic stenosis is present.   5. Aortic dilatation noted. There is dilatation of the aortic root,  measuring 41 mm.   6. Anechoic structure within the liver parenchyma measuring 5.5 cm x 6.9  cm, likely cyst. Clinical correlation required.. The inferior vena cava is  normal in size with greater than 50% respiratory variability, suggesting  right atrial pressure of 3 mmHg.   Comparison(s): A prior study was performed on 02/27/2021. AR remains mild  to moderate. Liver cyst noted on prior study  as well (7.7x10.2cm).   Stress Testing: Lexiscan Tetrofosmin Stress Test 05/29/2020: Low risk study  Carotid artery duplex 06/12/2021: No hemodynamically significant  arterial disease in the internal carotid artery bilaterally. Minimal soft plaque noted in bilateral carotid arteries.  Compared to 06/04/2020, right ICA stenosis of 15-49% not seen. Antegrade right vertebral artery flow. Antegrade left vertebral artery flow.  RADIOLOGY: NA  Risk Assessment/Calculations:   NA  Labs:    Collected: 10/25/2019 Creatinine 0.99 mg/dL. eGFR: >60 mL/min per 1.73 m Potassium 4.2 AST 24, ALT 38, alkaline phosphatase 59 Lipid profile: Total cholesterol 105, triglycerides 54, HDL 49, LDL 43, non-HDL 56 Hemoglobin A1c: 5.6 TSH: 0.73,Free T4 1.3, and Free T3 2.8 BNP: 53      Latest Ref Rng & Units 12/31/2022   10:31 PM 12/29/2022   10:52 AM 12/17/2022   10:04 PM  CBC  WBC 4.0 - 10.5 K/uL 3.2  3.4  4.0   Hemoglobin 13.0 - 17.0 g/dL 87.4  86.9  87.4   Hematocrit 39.0 - 52.0 % 38.3  39.9  38.2   Platelets 150 - 400 K/uL 218  233  208        Latest Ref Rng & Units 12/31/2022   10:31 PM 12/17/2022   10:04 PM 11/27/2022    1:18 PM  BMP  Glucose 70 - 99 mg/dL 895  891  94   BUN 8 - 23 mg/dL 10  12  13    Creatinine 0.61 - 1.24 mg/dL 9.09  8.96  8.96   Sodium 135 - 145 mmol/L 136  133  136   Potassium 3.5 - 5.1 mmol/L 3.8  3.6  3.8   Chloride 98 - 111 mmol/L 97  98  99   CO2 22 - 32 mmol/L 24  24  26    Calcium  8.9 - 10.3 mg/dL 9.6  8.8  9.4       Latest Ref Rng & Units 12/31/2022   10:31 PM 12/17/2022   10:04 PM 11/27/2022    1:18 PM  CMP  Glucose 70 - 99 mg/dL 895  891  94   BUN 8 - 23 mg/dL 10  12  13    Creatinine 0.61 - 1.24 mg/dL 9.09  8.96  8.96   Sodium 135 - 145 mmol/L 136  133  136   Potassium 3.5 - 5.1 mmol/L 3.8  3.6  3.8   Chloride 98 - 111 mmol/L 97  98  99   CO2 22 - 32 mmol/L 24  24  26    Calcium  8.9 - 10.3 mg/dL 9.6  8.8  9.4   Total Protein 6.5 - 8.1 g/dL 7.3  7.1  7.4   Total Bilirubin 0.3 - 1.2 mg/dL 0.9  0.5  0.8   Alkaline Phos 38 - 126 U/L 48  47  50   AST 15 - 41 U/L 22  23  27    ALT 0 - 44 U/L 34  34  30     No results  found for: CHOL, HDL, LDLCALC, LDLDIRECT, TRIG, CHOLHDL No results for input(s): LIPOA in the last 8760 hours. No components found for: NTPROBNP No results for input(s): PROBNP in the last 8760 hours. Recent Labs    12/29/22 1052  TSH 1.14    Physical Exam:    Today's Vitals   06/09/23 0949  BP: (!) 142/84  Pulse: 74  Resp: 16  SpO2: 96%  Weight: 167 lb 12.8 oz (76.1 kg)  Height: 6' 2 (1.88 m)  Body mass index is 21.54 kg/m. Wt Readings from Last 3 Encounters:  06/09/23 167 lb 12.8 oz (76.1 kg)  04/07/23 164 lb 3.2 oz (74.5 kg)  12/31/22 178 lb 9.2 oz (81 kg)    Physical Exam  Constitutional: No distress.  Age appropriate, hemodynamically stable.   HENT:  Hearing aids  Neck: No JVD present.  Cardiovascular: Normal rate, regular rhythm, S1 normal, S2 normal, intact distal pulses and normal pulses. Exam reveals no gallop, no S3 and no S4.  No murmur heard. Pulses:      Dorsalis pedis pulses are 2+ on the right side and 2+ on the left side.       Posterior tibial pulses are 2+ on the right side and 2+ on the left side.  Pulmonary/Chest: Effort normal and breath sounds normal. No stridor. He has no wheezes. He has no rales.  Abdominal: Soft. Bowel sounds are normal. He exhibits no distension. There is no abdominal tenderness.  Musculoskeletal:        General: No edema.     Cervical back: Neck supple.  Neurological: He is alert and oriented to person, place, and time. He has intact cranial nerves (2-12).  Skin: Skin is warm and moist.   Impression & Recommendation(s):  Impression:   ICD-10-CM   1. Nonrheumatic aortic valve insufficiency  I35.1 EKG 12-Lead    2. Aortic root dilatation (HCC)  I77.810 CT CHEST WO CONTRAST    3. Benign hypertension  I10     4. Hypercholesteremia  E78.00     5. Former smoker  Z87.891        Recommendation(s):  Nonrheumatic aortic valve insufficiency Asymptomatic. Follow-up echocardiogram February 2025 notes  stable aortic regurgitation and mild/moderate in severity. Monitor for now  Aortic root dilatation (HCC) Incidentally noted on echocardiogram dated 06/07/2023 at 41 mm. Will schedule a CT of the chest without contrast in February 2026 to evaluate disease progression. Reemphasized the importance of blood pressure management. Office blood pressure not at goal. Currently on beta-blocker therapy as well as ARB.  Benign hypertension Office blood pressures are not at goal. Reemphasized importance of blood pressure management. Reemphasized importance of low-salt diet. Continue valsartan /hydrochlorothiazide  320/12.5 mg p.o. daily. Continue Bystolic  10 mg p.o. daily Continue amlodipine  10 mg p.o. daily  Hypercholesteremia Currently on atorvastatin  40 mg p.o. nightly. Does not endorse myalgias. Most recent LDL 50 mg/dL as of June 2024 via [facility] database  On the recent echocardiogram he was noted to have a anechoic structure within the liver parenchyma concerning for liver cyst.  CT of the abdomen with contrast back in May 2022 reported multiple benign hepatic cysts, largest in the left lobe measuring 8.5 cm.  Will defer further workup to PCP.  Orders Placed:  Orders Placed This Encounter  Procedures   CT CHEST WO CONTRAST    Standing Status:   Future    Expected Date:   06/05/2024    Expiration Date:   06/08/2024    Preferred imaging location?:   [facility]   EKG 12-Lead   Discussed management of at least 2 chronic comorbid conditions, independently reviewed echocardiogram results from 06/07/2023, labs from [facility] database dating 10/08/2022, EKG ordered and independently reviewed, discussed management of comorbid conditions and coordination of care  Final Medication List:   No orders of the defined types were placed in this encounter.   There are no discontinued medications.   Current Outpatient Medications:    ALPRAZolam  (XANAX ) 0.25 MG tablet, Take 0.25 mg  by mouth daily as needed.,  Disp: , Rfl:    aluminum hydroxide-magnesium  carbonate (GAVISCON) 95-358 MG/15ML SUSP, Take 30 mLs by mouth as needed for indigestion or heartburn., Disp: , Rfl:    amLODipine  (NORVASC ) 10 MG tablet, Take 10 mg by mouth daily., Disp: , Rfl:    ascorbic acid  (VITAMIN C) 500 MG tablet, Take 500 mg by mouth daily., Disp: , Rfl:    aspirin  81 MG EC tablet, Take 81 mg by mouth at bedtime., Disp: , Rfl:    atorvastatin  (LIPITOR) 40 MG tablet, Take 40 mg by mouth at bedtime., Disp: , Rfl:    Azelastine  HCl 137 MCG/SPRAY SOLN, PLACE 2 SPRAYS INTO THE NOSE 2 (TWO) TIMES DAILY AS NEEDED., Disp: 90 mL, Rfl: 1   b complex vitamins tablet, Take 1 tablet by mouth daily as needed., Disp: , Rfl:    citalopram (CELEXA) 20 MG tablet, Take 20 mg by mouth as needed., Disp: , Rfl:    Coenzyme Q10-levOCARNitine (CO Q-10 PLUS PO), Take 1 tablet by mouth daily at 12 noon., Disp: , Rfl:    desloratadine  (CLARINEX ) 5 MG tablet, TAKE 1 TABLET BY MOUTH 2 TIMES DAILY AS NEEDED., Disp: 180 tablet, Rfl: 1   diclofenac  Sodium (VOLTAREN ) 1 % GEL, Apply 4 g topically 4 (four) times daily., Disp: 100 g, Rfl: 0   Digestive Enzymes CAPS, Take one 20 minutes before meals, Disp: 90 capsule, Rfl: 0   ECHINACEA EXTRACT PO, Take 1 capsule by mouth daily as needed (cold symptoms)., Disp: , Rfl:    EPINEPHrine  0.3 mg/0.3 mL IJ SOAJ injection, Use as directed for life threatening allergic reactions, Disp: 2 each, Rfl: 3   famotidine  (PEPCID ) 40 MG tablet, Take 40 mg by mouth daily., Disp: , Rfl:    fluticasone  (FLONASE ) 50 MCG/ACT nasal spray, Place 2 sprays into both nostrils daily., Disp: 48 mL, Rfl: 5   hydrOXYzine  (VISTARIL ) 25 MG capsule, Take 1 capsule (25 mg total) by mouth every 8 (eight) hours as needed for anxiety., Disp: 30 capsule, Rfl: 0   Magnesium  250 MG TABS, Take 250 mg by mouth at bedtime., Disp: , Rfl:    Melatonin 5 MG CAPS, Take 10 mg by mouth at bedtime., Disp: , Rfl:    nebivolol  (BYSTOLIC ) 10 MG tablet, TAKE 1 TABLET  BY MOUTH EVERY DAY IN THE MORNING, Disp: 90 tablet, Rfl: 3   Nutritional Supplements (JUICE PLUS FIBRE PO), Take 2 capsules by mouth daily., Disp: , Rfl:    omeprazole  (PRILOSEC) 40 MG capsule, Take 1 capsule (40 mg total) by mouth in the morning and at bedtime., Disp: 180 capsule, Rfl: 3   Polyethylene Glycol 3350  (MIRALAX  PO), Take by mouth daily., Disp: , Rfl:    Probiotic Product (PROBIOTIC DAILY PO), Take 1 capsule by mouth daily., Disp: , Rfl:    simethicone  (MYLICON) 80 MG chewable tablet, Chew 1 tablet (80 mg total) by mouth every 6 (six) hours as needed for flatulence., Disp: 30 tablet, Rfl: 0   sucralfate  (CARAFATE ) 1 g tablet, TAKE 1 TABLET BY MOUTH 3 TIMES DAILY., Disp: 270 tablet, Rfl: 1   sucralfate  (CARAFATE ) 1 GM/10ML suspension, SMARTSIG:2 Teaspoon By Mouth 4 Times Daily, Disp: , Rfl:    triamcinolone  cream (KENALOG ) 0.1 %, Apply 1 application. topically 2 (two) times daily., Disp: 454 g, Rfl: 1   valsartan -hydrochlorothiazide  (DIOVAN -HCT) 320-12.5 MG tablet, Take 1 tablet by mouth daily., Disp: , Rfl:    Wheat Dextrin (BENEFIBER PO), Take by mouth 3 (three)  times daily., Disp: , Rfl:    XTANDI  40 MG tablet, Take 80 mg by mouth 2 (two) times daily., Disp: , Rfl:    acetaminophen  (TYLENOL ) 500 MG tablet, Take 1,000 mg by mouth every 8 (eight) hours as needed for moderate pain. (Patient not taking: Reported on 06/09/2023), Disp: , Rfl:    cholecalciferol  (VITAMIN D3) 25 MCG (1000 UNIT) tablet, Take 1,000 Units by mouth daily. (Patient not taking: Reported on 06/09/2023), Disp: , Rfl:    FARXIGA 10 MG TABS tablet, Take 10 mg by mouth daily. (Patient not taking: Reported on 06/09/2023), Disp: , Rfl:   Consent:   NA  Disposition:   1 year follow-up  His questions and concerns were addressed to his satisfaction. He voices understanding of the recommendations provided during this encounter.    Signed, Madonna Large, DO, [facility]  [facility]  [address]  [address] [place], [place] [address] 06/09/2023 11:01 AM

## 2023-06-09 NOTE — Progress Notes (Signed)
 [facility] Counselor/Therapist Progress Note  Patient ID: Kenneth Daniel, MRN: [id number]    Date: 06/09/23  Time Spent: 210  pm - 300 pm : 50 Minutes  Treatment Type: Individual Therapy.  Patient reports anxiety and depression due to a recent illness and his fear of something bad happening. Patient reports having stomach issues that previously left him anxious when traveling which is something he enjoys.   Mental Status Exam: Appearance:  Casual     Behavior: Appropriate  Motor: Normal  Speech/Language:  Clear and Coherent  Affect: Appropriate  Mood: normal  Thought process: normal  Thought content:   WNL  Sensory/Perceptual disturbances:   WNL  Orientation: oriented to person, place, time/date, situation, day of week, month of year, and year  Attention: Good  Concentration: Good  Memory: WNL  Fund of knowledge:  Good  Insight:   Good  Judgment:  Good  Impulse Control: Good    Risk Assessment: Danger to Self:  No Self-injurious Behavior: No Danger to Others: No Duty to Warn:no Physical Aggression / Violence:No  Access to Firearms a concern: No  Gang Involvement:No  Subjective:   Kenneth Daniel participated from office, located at [facility] with Clinician present. Odarius consented to treatment.    Kenneth Daniel presented for his session in a positive mood.  Patient reports that he had a good time with his friends in [place]  and that his flight went well. Patient reports that he utilized the 3-3-3 coping skill we practiced in our previous sessions. 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 Patient states that he is a bit sad as he misses his friends and knowing it could be another year before he gets to see them. Patient does report that his niece and his nephew are coming from [place]  this week and he is excited for that. He states he is planning a super bowl party for about 9  people. Patient also shared his desire to stay active and walk more due to his cardiologist telling him that he is doing great but needs to walk more.  Clinician provided support via active listening and verbal interaction with patient. Clinician engaged in discussion with patient about community resources for increasing his socialization locally so he doesn't feel so lonely and miss his other friends so much from [place] . Clinician also provided encouragement as patient shared his interest I returning to some of the cooking classes at the hospital. Patient was more positive in this session and not as anxious as he was prior to knowing he was flying to [place] . Clinician observed a more calm affect and patient was more engaged in discussing his future goals and plans, evidencing a positive outlook.  Patient continues to be insightful and is always eager to implement advice from his medical providers and therapist alike. Patient is open to suggestions and feedback and is motivated to make healthier choices both physically and emotionally. Patient also shared that the more he gets positive reports from providers on his health the more optimistic he becomes about his future.   Reduce checking behaviors: Decrease the frequency of checking body for signs of illness, like repeatedly checking pulse, temperature, or examining skin for abnormalities.  Limit reassurance seeking: Minimize the need to constantly seek reassurance from others about one's health concerns.  Challenge negative thoughts: Identify and challenge distorted thoughts related to health concerns, replacing them with more realistic interpretations.  Develop  coping mechanisms: Learn relaxation techniques like deep breathing, mindfulness, or progressive muscle relaxation to manage anxiety.  Improve self-awareness: Recognize triggers that exacerbate health anxieties and develop strategies to manage them.  Educate about normal bodily  sensations: Gain a better understanding of typical bodily functions and sensations to reduce misinterpretation of symptoms.  Engage in healthy lifestyle practices: Prioritize regular exercise, balanced diet, and sufficient sleep to support overall well-being.  Seek professional support: Collaborate with a therapist, preferably one specializing in cognitive behavioral therapy (CBT), to address underlying anxieties and develop coping skills.  Specific goal examples: Reduce the number of times checking body for symptoms per day by 50% within 2 months.  Decrease the frequency of seeking reassurance from others regarding health concerns by 75% within 3 months.  Practice relaxation techniques for 15 minutes daily for at least 4 weeks.  Identify and challenge at least 3 negative health-related thoughts per week.  Attend therapy sessions regularly to discuss coping strategies and progress.  Important considerations: Collaboration with healthcare provider: Maintain open communication with your doctor to address any legitimate medical concerns and manage anxiety effectively.  Continue with biweekly therapy. Treatment plan to be reviewed by 12/28/2023  Interventions: Cognitive Behavioral Therapy  Diagnosis: Adjustment Disorder with mixed anxiety and depression.   [name] Junk MSW, LCSW/DATE 06/09/2023

## 2023-06-10 ENCOUNTER — Ambulatory Visit (INDEPENDENT_AMBULATORY_CARE_PROVIDER_SITE_OTHER): Payer: Medicare PPO | Admitting: *Deleted

## 2023-06-10 DIAGNOSIS — J309 Allergic rhinitis, unspecified: Secondary | ICD-10-CM | POA: Diagnosis not present

## 2023-06-21 ENCOUNTER — Ambulatory Visit: Payer: Medicare PPO | Admitting: Licensed Clinical Social Worker

## 2023-06-21 DIAGNOSIS — F4323 Adjustment disorder with mixed anxiety and depressed mood: Secondary | ICD-10-CM

## 2023-06-21 NOTE — Progress Notes (Addendum)
 [facility] Counselor/Therapist Progress Note  Patient ID: Kenneth Daniel, MRN: [id number]    Date: 06/21/23  Time Spent: 102  pm - 149 pm : 47 Minutes  Treatment Type: Individual Therapy.  Reported Symptoms: Patient reports anxiety and depression due to a recent illness and his fear of something bad happening. Patient reports having stomach issues that previously left him anxious when traveling which is something he enjoys.   Mental Status Exam: Appearance:  Casual     Behavior: Appropriate  Motor: Normal  Speech/Language:  Clear and Coherent  Affect: Appropriate  Mood: normal  Thought process: normal  Thought content:   WNL  Sensory/Perceptual disturbances:   WNL  Orientation: oriented to person, place, time/date, situation, day of week, month of year, and year  Attention: Good  Concentration: Good  Memory: WNL  Fund of knowledge:  Good  Insight:   Good  Judgment:  Good  Impulse Control: Good    Risk Assessment: Danger to Self:  No Self-injurious Behavior: No Danger to Others: No Duty to Warn:no Physical Aggression / Violence:No  Access to Firearms a concern: No  Gang Involvement:No  Subjective:    Kenneth Daniel participated from office, located at [facility] with Clinician present. Kenneth Daniel consented to treatment.    Kenneth Daniel presented in a good mood for his session. Patient reports having s [employer] with some family and friends at his home. He reported that it was a wonderful time. Patient reported that he has had an increase in energy and desire to make healthy choices regarding his health. Patient reports that he continues to walk and go to the gym. Patient reports that he is eating more and looking for ways to be engaged in the community.  Clinician processed with patient the idea of looking on [facility] website as they may have various activities at different locations. Clinician also encouraged patient to continue to go to the gym and  work on building relationship there at the gym. Clinician provided praise and continued encouragement to patient via active listening and verbal feedback. Clinician and patient continued to process coping skills to assist with depression and anxiety.  Patient was actively engaged and freely engaged in discussion with Clinician. Patient continues to work toward healthy choices and activity. Patient will continue to work toward increased knowledge of how to implement the following: Reduce checking behaviors: Decrease the frequency of checking body for signs of illness, like repeatedly checking pulse, temperature, or examining skin for abnormalities.  Limit reassurance seeking: Minimize the need to constantly seek reassurance from others about one's health concerns.  Challenge negative thoughts: Identify and challenge distorted thoughts related to health concerns, replacing them with more realistic interpretations.  Develop coping mechanisms: Learn relaxation techniques like deep breathing, mindfulness, or progressive muscle relaxation to manage anxiety.  Improve self-awareness: Recognize triggers that exacerbate health anxieties and develop strategies to manage them.  Educate about normal bodily sensations: Gain a better understanding of typical bodily functions and sensations to reduce misinterpretation of symptoms.  Engage in healthy lifestyle practices: Prioritize regular exercise, balanced diet, and sufficient sleep to support overall well-being.  Seek professional support: Collaborate with a therapist, preferably one specializing in cognitive behavioral therapy (CBT), to address underlying anxieties and develop coping skills.  Specific goal examples: Reduce the number of times checking body for symptoms per day by 50% within 2 months.  Decrease the frequency of seeking reassurance from others regarding health concerns by 75% within 3 months.  Practice  relaxation techniques for 15 minutes  daily for at least 4 weeks.  Identify and challenge at least 3 negative health-related thoughts per week.  Attend therapy sessions regularly to discuss coping strategies and progress.  Important considerations: Collaboration with healthcare provider: Maintain open communication with your doctor to address any legitimate medical concerns and manage anxiety effectively.  Continue with biweekly therapy. Treatment plan to be reviewed by 12/28/2023   Interventions: Cognitive Behavioral Therapy   Diagnosis: Adjustment Disorder with mixed anxiety and depression.    Phyllis Ginger MSW, LCSW/DATE 06/21/2023

## 2023-06-25 ENCOUNTER — Ambulatory Visit: Payer: Self-pay | Admitting: Cardiology

## 2023-07-05 ENCOUNTER — Ambulatory Visit: Payer: Medicare PPO | Admitting: Licensed Clinical Social Worker

## 2023-07-05 DIAGNOSIS — F4323 Adjustment disorder with mixed anxiety and depressed mood: Secondary | ICD-10-CM

## 2023-07-05 NOTE — Progress Notes (Signed)
 [facility] Counselor/Therapist Progress Note  Patient ID: Kenneth Daniel, MRN: [id number]    Date: 07/05/23  Time Spent: 110  pm - 0200 pm : 50 Minutes  Treatment Type: Individual Therapy.  Reported Symptoms: Patient reports anxiety and depression due to a recent illness and his fear of something bad happening. Patient reports having stomach issues that previously left him anxious when traveling which is something he enjoys.   Mental Status Exam: Appearance:  Casual     Behavior: Appropriate  Motor: Normal  Speech/Language:  Clear and Coherent  Affect: Appropriate  Mood: normal  Thought process: normal  Thought content:   WNL  Sensory/Perceptual disturbances:   WNL  Orientation: oriented to person, place, time/date, situation, day of week, month of year, and year  Attention: Good  Concentration: Good  Memory: WNL  Fund of knowledge:  Good  Insight:   Good  Judgment:  Good  Impulse Control: Good    Risk Assessment: Danger to Self:  No Self-injurious Behavior: No Danger to Others: No Duty to Warn:no Physical Aggression / Violence:No  Access to Firearms a concern: No  Gang Involvement:No  Subjective:    [name] participated from office, located at [facility] with Clinician present. [place] consented to treatment.    Patient presented for his session nearly 10 minutes late. Patient was struggling to hear and he reported that he is using loaner hearing aides and the battery has died. Patient stated that he is doing well overall but lost a close friend last week. Patient recognized that when a friend passes it places him in a different frame of mind and he struggles to think positively. Patient reports that he has lost several friends in the past year. Patient also reported that he struggles to go to sleep at night. He states he likes to watch TV and finds things to do and  finds himself staying up late. He states he doesn't know how to get in bed  earlier.  Clinician provided support via active listening and verbal interaction. Clinician acknowledged the difficulty with loss and reminded patient that it's natural that we think of many different scenarios while grieving. Clinician processed with patient his feelings and allowed him a safe space to share his feelings. Clinician also processed with patient tips for setting a sleep routine. We discussed the following : Set a schedule Go to bed and wake up at the same time each day, including weekends  Use an alarm to remind you when it's time to get ready for bed  Create a comfortable sleep environment  Keep your bedroom cool, dark, and quiet Avoid bright screens before bed Avoid stimulating substances and activities  Avoid caffeine, alcohol, and nicotine before bed Avoid heavy meals within a couple of hours of bedtime Avoid physical activity right before bed Develop a bedtime routine  Dim the lights Quietly read or stretch Put on pajamas Brush your teeth Choose calming activities, like reading a book, meditating, or listening to music Other tips  Avoid staying up or sleeping in for more than 1 to 2 hours Limit light exposure in the evening Don't nap too close to your regular bedtime Exercise regularly, but not too soon before bedtime.  Patient agreed to work on a sleep routine over the next couple of weeks and report back to Clinician at his next bi weekly session. Patient will continue with bi weekly therapy and treatment plan will be reviewed by 12/28/2023.  Patient will continue to work  on his concern of medical issues as he recognizes that this increases when he loses a friend to death. Patient will continue to work toward increased knowledge of how to implement the following: Reduce checking behaviors: Decrease the frequency of checking body for signs of illness, like repeatedly checking pulse, temperature, or examining skin for abnormalities.  Limit reassurance  seeking: Minimize the need to constantly seek reassurance from others about one's health concerns.  Challenge negative thoughts: Identify and challenge distorted thoughts related to health concerns, replacing them with more realistic interpretations.  Develop coping mechanisms: Learn relaxation techniques like deep breathing, mindfulness, or progressive muscle relaxation to manage anxiety.  Improve self-awareness: Recognize triggers that exacerbate health anxieties and develop strategies to manage them.  Educate about normal bodily sensations: Gain a better understanding of typical bodily functions and sensations to reduce misinterpretation of symptoms.  Engage in healthy lifestyle practices: Prioritize regular exercise, balanced diet, and sufficient sleep to support overall well-being.  Seek professional support: Collaborate with a therapist, preferably one specializing in cognitive behavioral therapy (CBT), to address underlying anxieties and develop coping skills.  Specific goal examples: Reduce the number of times checking body for symptoms per day by 50% within 2 months.  Decrease the frequency of seeking reassurance from others regarding health concerns by 75% within 3 months.  Practice relaxation techniques for 15 minutes daily for at least 4 weeks.  Identify and challenge at least 3 negative health-related thoughts per week.  Attend therapy sessions regularly to discuss coping strategies and progress.  Important considerations: Collaboration with healthcare provider: Maintain open communication with your doctor to address any legitimate medical concerns and manage anxiety effectively.  Continue with biweekly therapy. Treatment plan to be reviewed by 12/28/2023   Interventions: Cognitive Behavioral Therapy   Diagnosis: Adjustment Disorder with mixed anxiety and depression.     Phyllis Ginger MSW, LCSW/DATE 07/05/2023

## 2023-07-07 ENCOUNTER — Encounter: Payer: Self-pay | Admitting: Sports Medicine

## 2023-07-07 ENCOUNTER — Ambulatory Visit: Payer: Medicare PPO | Admitting: Sports Medicine

## 2023-07-07 VITALS — BP 110/70 | HR 94 | Temp 96.6°F | Resp 16 | Ht 74.0 in | Wt 170.4 lb

## 2023-07-07 DIAGNOSIS — J3089 Other allergic rhinitis: Secondary | ICD-10-CM | POA: Diagnosis not present

## 2023-07-07 DIAGNOSIS — F411 Generalized anxiety disorder: Secondary | ICD-10-CM | POA: Diagnosis not present

## 2023-07-07 DIAGNOSIS — I351 Nonrheumatic aortic (valve) insufficiency: Secondary | ICD-10-CM

## 2023-07-07 DIAGNOSIS — C61 Malignant neoplasm of prostate: Secondary | ICD-10-CM

## 2023-07-07 DIAGNOSIS — I10 Essential (primary) hypertension: Secondary | ICD-10-CM | POA: Diagnosis not present

## 2023-07-07 DIAGNOSIS — E782 Mixed hyperlipidemia: Secondary | ICD-10-CM

## 2023-07-07 DIAGNOSIS — R634 Abnormal weight loss: Secondary | ICD-10-CM

## 2023-07-07 NOTE — Progress Notes (Signed)
 Careteam: Patient Care Team: Renaye Rakers, MD as PCP - General (Family Medicine) Venita Sheffield, MD as PCP - Internal Medicine (Internal Medicine) Tessa Lerner, DO as PCP - Cardiology (Cardiology)  PLACE OF SERVICE:  [facility]  Advanced Directive information Does Patient Have a Medical Advance Directive?: Yes, Type of Advance Directive: Healthcare Power of [place];Living will, Does patient want to make changes to medical advance directive?: No - Patient declined  No Known Allergies  Chief Complaint  Patient presents with   Medical Management of Chronic Issues    3 month follow up.    Immunizations    Discuss the need for [employer].    Health Maintenance    Discuss the need for Hepatitis C Screening.      Discussed the use of AI scribe software for clinical note transcription with the patient, who gave verbal consent to proceed.  History of Present Illness   Kenneth Daniel is a 78 year old male with hypertension who presents for a follow-up visit.   HTN H/o Aortic root dilatation Aortic insufficiency  Followed with cardiology Denies chest pain, palpitations, orthopnea, PND  His blood pressure is managed with amlodipine 10 mg, hydrochlorothiazide, and valsartan, which he finds effective. He also takes atorvastatin nightly for cholesterol management.   GERD  He takes omeprazole twice daily and sucralfate three times a day and at night for gastrointestinal issues. Eating before flying or in the morning helps prevent anxiety that he feels originates from his gut.     H/o Prostate cancer He has a history of prostate cancer and takes medication to manage his PSA levels, which have decreased to 0.0 from 0.7 over the past year.   Weight loss He notes an improvement in his eating habits, stating he is 'definitely eating more.' His weight has increased from 167 pounds to 170 pounds since the last visit.    He is socially active, attending a senior gym and  participating in chair yoga weekly. He finds social interaction beneficial for his mood and anxiety, which he manages with the help of a behavioral therapist.  He uses hearing aids and is currently using a loaner while his own is being repaired. He is exploring options to avoid a cochlear implant.         Review of Systems:  Review of Systems  Constitutional:  Negative for chills and fever.  HENT:  Negative for congestion and sore throat.   Eyes:  Negative for double vision.  Respiratory:  Negative for cough, sputum production and shortness of breath.   Cardiovascular:  Negative for chest pain, palpitations and leg swelling.  Gastrointestinal:  Negative for abdominal pain, heartburn and nausea.  Genitourinary:  Negative for dysuria, frequency and hematuria.  Musculoskeletal:  Negative for falls and myalgias.  Neurological:  Negative for dizziness, sensory change and focal weakness.   Negative unless indicated in HPI.   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, right    per last duplex in epic 06-04-2020  right ICA 16-49% and right ECA >50%   Chronic constipation    GERD (gastroesophageal reflux disease)    History of acute pyelonephritis 09/2020   w/ admission in epic due to severe sepsis   History of COVID-19 11/18/2020   positive result in epic;   per pt mild to moderate symptoms that resolved   History of external beam radiation therapy    completed in 2015 for  recurrent prostate cancer (done in [facility])   History of prostate cancer    (current urologist-- dr Benancio Deeds, and current treatment lupron injeciton q 6months) ;   per pt in [place], [place] 2004  s/p radical prostatectomy;   recurrence 2015 completed radiation   HLD (hyperlipidemia)    Hypertension    followed by pcp and cardiology---   (05-29-2020  nuclear stress test in epic , low risk no ischemia nuclear ef 63%)   Inguinal hernia, right    Non-rheumatic aortic  regurgitation    mild to moderate AR without stenosis per last echo in epic 02-27-2021   Perennial allergic rhinitis    [facility]   Wears dentures    upper   Wears glasses    Wears hearing aid in both ears    Past Surgical History:  Procedure Laterality Date   ANTERIOR CERVICAL DECOMP/DISCECTOMY FUSION N/A 11/09/2019   Procedure: ANTERIOR CERVICAL DECOMPRESSION FUSION CERVICAL 3-4 WITH INSTRUMENTATION AND ALLOGRAFT;  Surgeon: Estill Bamberg, MD;  Location: [facility] OR;  Service: Orthopedics;  Laterality: N/A;   CARDIAC CATHETERIZATION  2002   in [place], [place];   per pt stress test with possible ischemia,  told arteries normal   COLONOSCOPY  2020   CYST EXCISION  2005   per pt from back , was benign   INGUINAL HERNIA REPAIR Right 06/04/2021   Procedure: OPEN RIGHT INGUINAL HERNIA REPAIR;  Surgeon: Berna Bue, MD;  Location: [facility];  Service: General;  Laterality: Right;   PROSTATECTOMY  2004   in [place], [place]   UPPER GI ENDOSCOPY  2021   Social History:   reports that he quit smoking about 52 years ago. His smoking use included cigarettes. He started smoking about 56 years ago. He has never used smokeless tobacco. He reports that he does not drink alcohol and does not use drugs.  Family History  Problem Relation Age of Onset   Pancreatic cancer Mother    Prostate cancer Father     Medications: Patient's Medications  New Prescriptions   No medications on file  Previous Medications   ACETAMINOPHEN (TYLENOL) 500 MG TABLET    Take 1,000 mg by mouth every 8 (eight) hours as needed for moderate pain (pain score 4-6).   ALPRAZOLAM (XANAX) 0.25 MG TABLET    Take 0.25 mg by mouth daily as needed.   ALUMINUM HYDROXIDE-MAGNESIUM CARBONATE (GAVISCON) 95-358 MG/15ML SUSP    Take 30 mLs by mouth as needed for indigestion or heartburn.   AMLODIPINE (NORVASC) 10 MG TABLET    Take 10 mg by mouth daily.   ASCORBIC ACID (VITAMIN C) 500 MG TABLET     Take 500 mg by mouth daily.   ASPIRIN 81 MG EC TABLET    Take 81 mg by mouth at bedtime.   ATORVASTATIN (LIPITOR) 40 MG TABLET    Take 40 mg by mouth at bedtime.   AZELASTINE HCL 137 MCG/SPRAY SOLN    PLACE 2 SPRAYS INTO THE NOSE 2 (TWO) TIMES DAILY AS NEEDED.   B COMPLEX VITAMINS TABLET    Take 1 tablet by mouth daily as needed.   CITALOPRAM (CELEXA) 20 MG TABLET    Take 20 mg by mouth as needed.   COENZYME Q10-LEVOCARNITINE (CO Q-10 PLUS PO)    Take 1 tablet by mouth daily at 12 noon.   DESLORATADINE (CLARINEX) 5 MG TABLET    TAKE 1 TABLET BY MOUTH 2 TIMES DAILY AS NEEDED.  DICLOFENAC SODIUM (VOLTAREN) 1 % GEL    Apply 4 g topically 4 (four) times daily.   DIGESTIVE ENZYMES CAPS    Take one 20 minutes before meals   ECHINACEA EXTRACT PO    Take 1 capsule by mouth daily as needed (cold symptoms).   EPINEPHRINE 0.3 MG/0.3 ML IJ SOAJ INJECTION    Use as directed for life threatening allergic reactions   FAMOTIDINE (PEPCID) 40 MG TABLET    Take 40 mg by mouth daily.   FLUTICASONE (FLONASE) 50 MCG/ACT NASAL SPRAY    Place 2 sprays into both nostrils daily.   HYDROXYZINE (VISTARIL) 25 MG CAPSULE    Take 1 capsule (25 mg total) by mouth every 8 (eight) hours as needed for anxiety.   MAGNESIUM 250 MG TABS    Take 250 mg by mouth at bedtime.   MELATONIN 5 MG CAPS    Take 10 mg by mouth at bedtime.   NEBIVOLOL (BYSTOLIC) 10 MG TABLET    TAKE 1 TABLET BY MOUTH EVERY DAY IN THE MORNING   NUTRITIONAL SUPPLEMENTS (JUICE PLUS FIBRE PO)    Take 2 capsules by mouth daily.   OMEPRAZOLE (PRILOSEC) 40 MG CAPSULE    Take 1 capsule (40 mg total) by mouth in the morning and at bedtime.   POLYETHYLENE GLYCOL 3350 (MIRALAX PO)    Take by mouth daily.   PROBIOTIC PRODUCT (PROBIOTIC DAILY PO)    Take 1 capsule by mouth daily.   SIMETHICONE (MYLICON) 80 MG CHEWABLE TABLET    Chew 1 tablet (80 mg total) by mouth every 6 (six) hours as needed for flatulence.   SUCRALFATE (CARAFATE) 1 G TABLET    TAKE 1 TABLET BY  MOUTH 3 TIMES DAILY.   SUCRALFATE (CARAFATE) 1 GM/10ML SUSPENSION    SMARTSIG:2 Teaspoon By Mouth 4 Times Daily   TRIAMCINOLONE CREAM (KENALOG) 0.1 %    Apply 1 application. topically 2 (two) times daily.   VALSARTAN-HYDROCHLOROTHIAZIDE (DIOVAN-HCT) 320-12.5 MG TABLET    Take 1 tablet by mouth daily.   VITAMIN D, ERGOCALCIFEROL, (DRISDOL) 1.25 MG (50000 UNIT) CAPS CAPSULE    Take 50,000 Units by mouth once a week.   WHEAT DEXTRIN (BENEFIBER PO)    Take by mouth 3 (three) times daily.   XTANDI 40 MG TABLET    Take 80 mg by mouth 2 (two) times daily.  Modified Medications   No medications on file  Discontinued Medications   CHOLECALCIFEROL (VITAMIN D3) 25 MCG (1000 UNIT) TABLET    Take 1,000 Units by mouth daily.   FARXIGA 10 MG TABS TABLET    Take 10 mg by mouth daily.    Physical Exam: Vitals:   07/07/23 1112  BP: 110/70  Pulse: 94  Resp: 16  Temp: (!) 96.6 F (35.9 C)  SpO2: 99%  Weight: 170 lb 6.4 oz (77.3 kg)  Height: 6\' 2"  (1.88 m)   Body mass index is 21.88 kg/m. BP Readings from Last 3 Encounters:  07/07/23 110/70  06/09/23 (!) 142/84  04/30/23 132/84   Wt Readings from Last 3 Encounters:  07/07/23 170 lb 6.4 oz (77.3 kg)  06/09/23 167 lb 12.8 oz (76.1 kg)  04/07/23 164 lb 3.2 oz (74.5 kg)    Physical Exam Constitutional:      Appearance: Normal appearance.  HENT:     Head: Normocephalic and atraumatic.  Cardiovascular:     Rate and Rhythm: Normal rate and regular rhythm.     Pulses: Normal pulses.  Heart sounds: Murmur heard.  Pulmonary:     Effort: No respiratory distress.     Breath sounds: No stridor. No wheezing or rales.  Abdominal:     General: Bowel sounds are normal. There is no distension.     Palpations: Abdomen is soft.     Tenderness: There is no abdominal tenderness. There is no right CVA tenderness or guarding.  Musculoskeletal:        General: No swelling.  Neurological:     Mental Status: He is alert. Mental status is at baseline.      Sensory: No sensory deficit.     Motor: No weakness.     Labs reviewed: Basic Metabolic Panel: Recent Labs    11/27/22 1318 12/17/22 2204 12/29/22 1052 12/31/22 2231  NA 136 133*  --  136  K 3.8 3.6  --  3.8  CL 99 98  --  97*  CO2 26 24  --  24  GLUCOSE 94 108*  --  104*  BUN 13 12  --  10  CREATININE 1.03 1.03  --  0.90  CALCIUM 9.4 8.8*  --  9.6  TSH  --   --  1.14  --    Liver Function Tests: Recent Labs    11/27/22 1318 12/17/22 2204 12/31/22 2231  AST 27 23 22   ALT 30 34 34  ALKPHOS 50 47 48  BILITOT 0.8 0.5 0.9  PROT 7.4 7.1 7.3  ALBUMIN 3.7 3.6 3.7   Recent Labs    11/27/22 1318 12/17/22 2204  LIPASE 44 41   No results for input(s): "AMMONIA" in the last 8760 hours. CBC: Recent Labs    11/27/22 1318 12/17/22 2204 12/29/22 1052 12/31/22 2231  WBC 2.6* 4.0 3.4* 3.2*  NEUTROABS 1.6*  --  2,135  --   HGB 12.9* 12.5* 13.0* 12.5*  HCT 39.3 38.2* 39.9 38.3*  MCV 84.9 85.8 86.7 85.1  PLT 187 208 233 218   Lipid Panel: No results for input(s): "CHOL", "HDL", "LDLCALC", "TRIG", "CHOLHDL", "LDLDIRECT" in the last 8760 hours. TSH: Recent Labs    12/29/22 1052  TSH 1.14   A1C: Lab Results  Component Value Date   HGBA1C 6.2 (H) 12/29/2022    Assessment and Plan        1. Primary hypertension (Primary) Stable  Cont with the same Labs ordered last visit, he did not do them  Instructed pt to go to lab  2. Nonrheumatic aortic valve insufficiency Stable Denies sob Follow up with cardiology  3. Perennial allergic rhinitis Denies sinus pain, post nasal drip Cont with azelastine , loratidine   4. Mixed hyperlipidemia Will check lipid panel Cont with lipitor  5. GAD (generalized anxiety disorder) Stable  Follow up with pyschology   6. Weight loss He gained few pounds since last visit  GERD Stable Cont with carafate , omeprazole  Prostate cancer On xtandi Follow up with urology    Other orders - Vitamin D,  Ergocalciferol, (DRISDOL) 1.25 MG (50000 UNIT) CAPS capsule; Take 50,000 Units by mouth once a week.       Return in about 3 months (around 10/07/2023) for SCHEDULE FOR [facility].:

## 2023-07-08 ENCOUNTER — Other Ambulatory Visit

## 2023-07-08 ENCOUNTER — Ambulatory Visit (INDEPENDENT_AMBULATORY_CARE_PROVIDER_SITE_OTHER)

## 2023-07-08 DIAGNOSIS — I10 Essential (primary) hypertension: Secondary | ICD-10-CM | POA: Diagnosis not present

## 2023-07-08 DIAGNOSIS — J309 Allergic rhinitis, unspecified: Secondary | ICD-10-CM

## 2023-07-09 LAB — CBC WITH DIFFERENTIAL/PLATELET
Absolute Lymphocytes: 966 {cells}/uL (ref 850–3900)
Absolute Monocytes: 339 {cells}/uL (ref 200–950)
Basophils Absolute: 20 {cells}/uL (ref 0–200)
Basophils Relative: 0.7 %
Eosinophils Absolute: 70 {cells}/uL (ref 15–500)
Eosinophils Relative: 2.4 %
HCT: 40.7 % (ref 38.5–50.0)
Hemoglobin: 13 g/dL — ABNORMAL LOW (ref 13.2–17.1)
MCH: 28.3 pg (ref 27.0–33.0)
MCHC: 31.9 g/dL — ABNORMAL LOW (ref 32.0–36.0)
MCV: 88.5 fL (ref 80.0–100.0)
MPV: 10.9 fL (ref 7.5–12.5)
Monocytes Relative: 11.7 %
Neutro Abs: 1505 {cells}/uL (ref 1500–7800)
Neutrophils Relative %: 51.9 %
Platelets: 195 10*3/uL (ref 140–400)
RBC: 4.6 10*6/uL (ref 4.20–5.80)
RDW: 14.4 % (ref 11.0–15.0)
Total Lymphocyte: 33.3 %
WBC: 2.9 10*3/uL — ABNORMAL LOW (ref 3.8–10.8)

## 2023-07-09 LAB — BASIC METABOLIC PANEL WITH GFR
BUN: 18 mg/dL (ref 7–25)
CO2: 30 mmol/L (ref 20–32)
Calcium: 9.7 mg/dL (ref 8.6–10.3)
Chloride: 105 mmol/L (ref 98–110)
Creat: 0.97 mg/dL (ref 0.70–1.28)
Glucose, Bld: 93 mg/dL (ref 65–99)
Potassium: 4 mmol/L (ref 3.5–5.3)
Sodium: 141 mmol/L (ref 135–146)
eGFR: 80 mL/min/{1.73_m2} (ref >=60)

## 2023-07-09 LAB — LIPID PANEL
Cholesterol: 121 mg/dL (ref <200)
HDL: 53 mg/dL (ref >=40)
LDL Cholesterol (Calc): 55 mg/dL
Non-HDL Cholesterol (Calc): 68 mg/dL (ref <130)
Total CHOL/HDL Ratio: 2.3 (calc) (ref <5.0)
Triglycerides: 45 mg/dL (ref <150)

## 2023-07-09 LAB — HEPATITIS C ANTIBODY: Hepatitis C Ab: NONREACTIVE

## 2023-07-15 DIAGNOSIS — E785 Hyperlipidemia, unspecified: Secondary | ICD-10-CM | POA: Diagnosis not present

## 2023-07-15 DIAGNOSIS — K30 Functional dyspepsia: Secondary | ICD-10-CM | POA: Diagnosis not present

## 2023-07-15 DIAGNOSIS — F419 Anxiety disorder, unspecified: Secondary | ICD-10-CM | POA: Diagnosis not present

## 2023-07-15 DIAGNOSIS — I10 Essential (primary) hypertension: Secondary | ICD-10-CM | POA: Diagnosis not present

## 2023-07-15 DIAGNOSIS — K4190 Unilateral femoral hernia, without obstruction or gangrene, not specified as recurrent: Secondary | ICD-10-CM | POA: Diagnosis not present

## 2023-07-15 DIAGNOSIS — F32A Depression, unspecified: Secondary | ICD-10-CM | POA: Diagnosis not present

## 2023-07-15 DIAGNOSIS — L210 Seborrhea capitis: Secondary | ICD-10-CM | POA: Diagnosis not present

## 2023-07-19 ENCOUNTER — Ambulatory Visit (INDEPENDENT_AMBULATORY_CARE_PROVIDER_SITE_OTHER): Admitting: Licensed Clinical Social Worker

## 2023-07-19 DIAGNOSIS — L814 Other melanin hyperpigmentation: Secondary | ICD-10-CM | POA: Diagnosis not present

## 2023-07-19 DIAGNOSIS — L821 Other seborrheic keratosis: Secondary | ICD-10-CM | POA: Diagnosis not present

## 2023-07-19 DIAGNOSIS — R208 Other disturbances of skin sensation: Secondary | ICD-10-CM | POA: Diagnosis not present

## 2023-07-19 DIAGNOSIS — L2989 Other pruritus: Secondary | ICD-10-CM | POA: Diagnosis not present

## 2023-07-19 DIAGNOSIS — L820 Inflamed seborrheic keratosis: Secondary | ICD-10-CM | POA: Diagnosis not present

## 2023-07-19 DIAGNOSIS — L538 Other specified erythematous conditions: Secondary | ICD-10-CM | POA: Diagnosis not present

## 2023-07-19 DIAGNOSIS — F4323 Adjustment disorder with mixed anxiety and depressed mood: Secondary | ICD-10-CM | POA: Diagnosis not present

## 2023-07-19 DIAGNOSIS — Z789 Other specified health status: Secondary | ICD-10-CM | POA: Diagnosis not present

## 2023-07-20 DIAGNOSIS — H903 Sensorineural hearing loss, bilateral: Secondary | ICD-10-CM | POA: Diagnosis not present

## 2023-07-20 NOTE — Progress Notes (Signed)
 [facility] Counselor/Therapist Progress Note  Patient ID: Kenneth Daniel, MRN: [id number]    Date: 07/19/2023  Time Spent: 114  pm - 200 pm : 46 Minutes  Reported Symptoms: Patient reports anxiety and depression due to a recent illness and his fear of something bad happening. Patient reports having stomach issues that previously left him anxious when traveling which is something he enjoys.   Mental Status Exam: Appearance:  Casual     Behavior: Appropriate  Motor: Normal  Speech/Language:  Clear and Coherent  Affect: Appropriate  Mood: normal  Thought process: normal  Thought content:   WNL  Sensory/Perceptual disturbances:   WNL  Orientation: oriented to person, place, time/date, situation, day of week, month of year, and year  Attention: Good  Concentration: Good  Memory: WNL  Fund of knowledge:  Good  Insight:   Good  Judgment:  Good  Impulse Control: Good    Risk Assessment: Danger to Self:  No Self-injurious Behavior: No Danger to Others: No Duty to Warn:no Physical Aggression / Violence:No  Access to Firearms a concern: No  Gang Involvement:No  Subjective:    Kenneth Daniel participated from office, located at [facility] with Clinician present. [name] consented to treatment.     Patient presented for his session nearly 15 minutes late. Patient shared that he found out last week that another of his friends from school passed away. Patient states that he had a recent scare with his primary doctor recommending that he see his dermatologist for a black spot on his head. Patient states as usual he did worry, but found out it was just hair dye where he dyed his hair. Patient states that he still gets very concerned about illness and caught up in fears when he hears of a friend passing away.   Clinician provided support via active listening and verbal feedback. Clinician processed with patient his concerns and discussed his emotions validating his  feelings. Clinician also provided a worksheet on the Cycle of Anxiety and how at times it can cause avoidance behaviors. Clinician and patient processed the worksheet together allowing time for patient to discuss how it pertains to him.   Patient was actively involved in discussion and was transparent about his fears and that he feels that as he has got older his fears have increased. Patient acknowledged that his age and knowing that his peers are passing away is concerning. Patient will continue to focus on coping skills and improving sleep hygiene. Clinician continued to process with patient tips for setting a sleep routine. We discussed the following : Set a schedule Go to bed and wake up at the same time each day, including weekends  Use an alarm to remind you when it's time to get ready for bed  Create a comfortable sleep environment  Keep your bedroom cool, dark, and quiet Avoid bright screens before bed Avoid stimulating substances and activities  Avoid caffeine, alcohol, and nicotine before bed Avoid heavy meals within a couple of hours of bedtime Avoid physical activity right before bed Develop a bedtime routine  Dim the lights Quietly read or stretch Put on pajamas Brush your teeth Choose calming activities, like reading a book, meditating, or listening to music Other tips  Avoid staying up or sleeping in for more than 1 to 2 hours Limit light exposure in the evening Don't nap too close to your regular bedtime Exercise regularly, but not too soon before bedtime.   Patient agreed to continue to  work on a sleep routine over the next couple of weeks and report back to Clinician at his next bi weekly session. Patient will continue with bi weekly therapy and treatment plan will be reviewed by 12/28/2023.   Patient will continue to work on his concern of medical issues as he recognizes that this increases when he loses a friend to death. Patient will continue to work toward increased  knowledge of how to implement the following: Reduce checking behaviors: Decrease the frequency of checking body for signs of illness, like repeatedly checking pulse, temperature, or examining skin for abnormalities.  Limit reassurance seeking: Minimize the need to constantly seek reassurance from others about one's health concerns.  Challenge negative thoughts: Identify and challenge distorted thoughts related to health concerns, replacing them with more realistic interpretations.  Develop coping mechanisms: Learn relaxation techniques like deep breathing, mindfulness, or progressive muscle relaxation to manage anxiety.  Improve self-awareness: Recognize triggers that exacerbate health anxieties and develop strategies to manage them.  Educate about normal bodily sensations: Gain a better understanding of typical bodily functions and sensations to reduce misinterpretation of symptoms.  Engage in healthy lifestyle practices: Prioritize regular exercise, balanced diet, and sufficient sleep to support overall well-being.  Seek professional support: Collaborate with a therapist, preferably one specializing in cognitive behavioral therapy (CBT), to address underlying anxieties and develop coping skills.  Specific goal examples: Reduce the number of times checking body for symptoms per day by 50% within 2 months.  Decrease the frequency of seeking reassurance from others regarding health concerns by 75% within 3 months.  Practice relaxation techniques for 15 minutes daily for at least 4 weeks.  Identify and challenge at least 3 negative health-related thoughts per week.  Attend therapy sessions regularly to discuss coping strategies and progress.  Important considerations: Collaboration with healthcare provider: Maintain open communication with your doctor to address any legitimate medical concerns and manage anxiety effectively.  Continue with biweekly therapy. Treatment plan to be reviewed by  12/28/2023   Interventions: Cognitive Behavioral Therapy   Diagnosis: Adjustment Disorder with mixed anxiety and depression.     Phyllis Ginger MSW, LCSW/DATE: 07/19/2023

## 2023-07-22 DIAGNOSIS — Z8719 Personal history of other diseases of the digestive system: Secondary | ICD-10-CM | POA: Diagnosis not present

## 2023-07-22 DIAGNOSIS — Z9889 Other specified postprocedural states: Secondary | ICD-10-CM | POA: Diagnosis not present

## 2023-07-31 ENCOUNTER — Other Ambulatory Visit: Payer: Self-pay | Admitting: Allergy & Immunology

## 2023-08-02 ENCOUNTER — Ambulatory Visit (INDEPENDENT_AMBULATORY_CARE_PROVIDER_SITE_OTHER): Admitting: Licensed Clinical Social Worker

## 2023-08-02 DIAGNOSIS — F4323 Adjustment disorder with mixed anxiety and depressed mood: Secondary | ICD-10-CM | POA: Diagnosis not present
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
## 2023-08-03 ENCOUNTER — Other Ambulatory Visit: Payer: Self-pay

## 2023-08-03 ENCOUNTER — Emergency Department (HOSPITAL_COMMUNITY)

## 2023-08-03 ENCOUNTER — Ambulatory Visit: Payer: Self-pay

## 2023-08-03 ENCOUNTER — Encounter (HOSPITAL_COMMUNITY): Payer: Self-pay | Admitting: Emergency Medicine

## 2023-08-03 ENCOUNTER — Emergency Department (HOSPITAL_COMMUNITY): Admission: EM | Admit: 2023-08-03 | Discharge: 2023-08-03 | Disposition: A | Discharge status: Home / Self Care

## 2023-08-03 DIAGNOSIS — Y92410 Unspecified street and highway as the place of occurrence of the external cause: Secondary | ICD-10-CM | POA: Insufficient documentation

## 2023-08-03 DIAGNOSIS — S0990XA Unspecified injury of head, initial encounter: Secondary | ICD-10-CM | POA: Diagnosis present

## 2023-08-03 DIAGNOSIS — R519 Headache, unspecified: Secondary | ICD-10-CM | POA: Diagnosis not present

## 2023-08-03 DIAGNOSIS — M542 Cervicalgia: Secondary | ICD-10-CM | POA: Diagnosis not present

## 2023-08-03 DIAGNOSIS — Z041 Encounter for examination and observation following transport accident: Secondary | ICD-10-CM | POA: Diagnosis not present

## 2023-08-03 DIAGNOSIS — M50322 Other cervical disc degeneration at C5-C6 level: Secondary | ICD-10-CM | POA: Diagnosis not present

## 2023-08-03 DIAGNOSIS — I10 Essential (primary) hypertension: Secondary | ICD-10-CM | POA: Insufficient documentation

## 2023-08-03 DIAGNOSIS — Z7982 Long term (current) use of aspirin: Secondary | ICD-10-CM | POA: Insufficient documentation

## 2023-08-03 DIAGNOSIS — M545 Low back pain, unspecified: Secondary | ICD-10-CM | POA: Diagnosis not present

## 2023-08-03 DIAGNOSIS — M25512 Pain in left shoulder: Secondary | ICD-10-CM | POA: Diagnosis not present

## 2023-08-03 DIAGNOSIS — S199XXA Unspecified injury of neck, initial encounter: Secondary | ICD-10-CM | POA: Diagnosis not present

## 2023-08-03 DIAGNOSIS — Z981 Arthrodesis status: Secondary | ICD-10-CM | POA: Diagnosis not present

## 2023-08-03 NOTE — Discharge Instructions (Signed)
 Your workup today was reassuring.  Please take Tylenol at home as needed for pain.  Follow-up with your doctor.  Return to the ER or call your doctor for worsening symptoms.
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
## 2023-08-08 ENCOUNTER — Observation Stay (HOSPITAL_COMMUNITY)

## 2023-08-08 ENCOUNTER — Emergency Department (HOSPITAL_COMMUNITY)

## 2023-08-08 ENCOUNTER — Encounter (HOSPITAL_COMMUNITY): Payer: Self-pay

## 2023-08-08 ENCOUNTER — Other Ambulatory Visit: Payer: Self-pay

## 2023-08-08 ENCOUNTER — Observation Stay (HOSPITAL_COMMUNITY)
Admission: EM | Admit: 2023-08-08 | Discharge: 2023-08-09 | Disposition: A | Discharge status: Home / Self Care | Attending: Internal Medicine | Admitting: Internal Medicine

## 2023-08-08 DIAGNOSIS — R200 Anesthesia of skin: Secondary | ICD-10-CM | POA: Diagnosis not present

## 2023-08-08 DIAGNOSIS — R9431 Abnormal electrocardiogram [ECG] [EKG]: Secondary | ICD-10-CM | POA: Diagnosis not present

## 2023-08-08 DIAGNOSIS — I70209 Unspecified atherosclerosis of native arteries of extremities, unspecified extremity: Secondary | ICD-10-CM | POA: Diagnosis not present

## 2023-08-08 DIAGNOSIS — R519 Headache, unspecified: Secondary | ICD-10-CM | POA: Diagnosis not present

## 2023-08-08 DIAGNOSIS — F411 Generalized anxiety disorder: Secondary | ICD-10-CM | POA: Insufficient documentation

## 2023-08-08 DIAGNOSIS — G459 Transient cerebral ischemic attack, unspecified: Secondary | ICD-10-CM | POA: Diagnosis not present

## 2023-08-08 DIAGNOSIS — E785 Hyperlipidemia, unspecified: Secondary | ICD-10-CM | POA: Diagnosis not present

## 2023-08-08 DIAGNOSIS — Z87891 Personal history of nicotine dependence: Secondary | ICD-10-CM | POA: Diagnosis not present

## 2023-08-08 DIAGNOSIS — R110 Nausea: Secondary | ICD-10-CM | POA: Diagnosis not present

## 2023-08-08 DIAGNOSIS — I10 Essential (primary) hypertension: Secondary | ICD-10-CM | POA: Insufficient documentation

## 2023-08-08 DIAGNOSIS — K219 Gastro-esophageal reflux disease without esophagitis: Secondary | ICD-10-CM | POA: Insufficient documentation

## 2023-08-08 DIAGNOSIS — Z79899 Other long term (current) drug therapy: Secondary | ICD-10-CM | POA: Insufficient documentation

## 2023-08-08 DIAGNOSIS — I2510 Atherosclerotic heart disease of native coronary artery without angina pectoris: Secondary | ICD-10-CM | POA: Diagnosis not present

## 2023-08-08 DIAGNOSIS — Z8546 Personal history of malignant neoplasm of prostate: Secondary | ICD-10-CM | POA: Diagnosis not present

## 2023-08-08 DIAGNOSIS — I6523 Occlusion and stenosis of bilateral carotid arteries: Secondary | ICD-10-CM | POA: Diagnosis not present

## 2023-08-08 DIAGNOSIS — R4182 Altered mental status, unspecified: Secondary | ICD-10-CM | POA: Diagnosis not present

## 2023-08-08 DIAGNOSIS — R202 Paresthesia of skin: Secondary | ICD-10-CM | POA: Insufficient documentation

## 2023-08-08 LAB — URINALYSIS, ROUTINE W REFLEX MICROSCOPIC
Bilirubin Urine: NEGATIVE
Glucose, UA: NEGATIVE mg/dL
Hgb urine dipstick: NEGATIVE
Ketones, ur: NEGATIVE mg/dL
Leukocytes,Ua: NEGATIVE
Nitrite: NEGATIVE
Protein, ur: NEGATIVE mg/dL
Specific Gravity, Urine: 1.027 (ref 1.005–1.030)
pH: 7 (ref 5.0–8.0)

## 2023-08-08 LAB — COMPREHENSIVE METABOLIC PANEL WITH GFR
ALT: 21 U/L (ref 0–44)
AST: 23 U/L (ref 15–41)
Albumin: 4.1 g/dL (ref 3.5–5.0)
Alkaline Phosphatase: 56 U/L (ref 38–126)
Anion gap: 9 (ref 5–15)
BUN: 17 mg/dL (ref 8–23)
CO2: 25 mmol/L (ref 22–32)
Calcium: 9.6 mg/dL (ref 8.9–10.3)
Chloride: 103 mmol/L (ref 98–111)
Creatinine, Ser: 0.97 mg/dL (ref 0.61–1.24)
GFR, Estimated: >60 mL/min (ref >60)
Glucose, Bld: 102 mg/dL — ABNORMAL HIGH (ref 70–99)
Potassium: 3.9 mmol/L (ref 3.5–5.1)
Sodium: 137 mmol/L (ref 135–145)
Total Bilirubin: 1 mg/dL (ref 0.0–1.2)
Total Protein: 8 g/dL (ref 6.5–8.1)

## 2023-08-08 LAB — ETHANOL: Alcohol, Ethyl (B): <10 mg/dL (ref <10)

## 2023-08-08 LAB — DIFFERENTIAL
Abs Immature Granulocytes: 0.01 10*3/uL (ref 0.00–0.07)
Basophils Absolute: 0 10*3/uL (ref 0.0–0.1)
Basophils Relative: 0 %
Eosinophils Absolute: 0 10*3/uL (ref 0.0–0.5)
Eosinophils Relative: 1 %
Immature Granulocytes: 0 %
Lymphocytes Relative: 25 %
Lymphs Abs: 0.7 10*3/uL (ref 0.7–4.0)
Monocytes Absolute: 0.3 10*3/uL (ref 0.1–1.0)
Monocytes Relative: 10 %
Neutro Abs: 1.7 10*3/uL (ref 1.7–7.7)
Neutrophils Relative %: 64 %

## 2023-08-08 LAB — CBC
HCT: 43.6 % (ref 39.0–52.0)
Hemoglobin: 13.9 g/dL (ref 13.0–17.0)
MCH: 28.7 pg (ref 26.0–34.0)
MCHC: 31.9 g/dL (ref 30.0–36.0)
MCV: 90.1 fL (ref 80.0–100.0)
Platelets: 206 10*3/uL (ref 150–400)
RBC: 4.84 MIL/uL (ref 4.22–5.81)
RDW: 15.8 % — ABNORMAL HIGH (ref 11.5–15.5)
WBC: 2.6 10*3/uL — ABNORMAL LOW (ref 4.0–10.5)
nRBC: 0 % (ref 0.0–0.2)

## 2023-08-08 LAB — RAPID URINE DRUG SCREEN, HOSP PERFORMED
Amphetamines: NOT DETECTED
Barbiturates: NOT DETECTED
Benzodiazepines: NOT DETECTED
Cocaine: NOT DETECTED
Opiates: NOT DETECTED
Tetrahydrocannabinol: NOT DETECTED

## 2023-08-08 LAB — PROTIME-INR
INR: 1 (ref 0.8–1.2)
Prothrombin Time: 13.6 s (ref 11.4–15.2)

## 2023-08-08 LAB — APTT: aPTT: 26 s (ref 24–36)

## 2023-08-08 MED ORDER — FAMOTIDINE 20 MG PO TABS
40.0000 mg | ORAL_TABLET | Freq: Every day | ORAL | Status: DC
  Administered 2023-08-08 – 2023-08-09 (×2): 40 mg via ORAL
  Filled 2023-08-08 (×3): qty 2

## 2023-08-08 MED ORDER — ONDANSETRON HCL 4 MG/2ML IJ SOLN: 4.0000 mg | Freq: Four times a day (QID) | INTRAMUSCULAR | Status: DC | PRN

## 2023-08-08 MED ORDER — ASPIRIN 81 MG PO TBEC
81.0000 mg | DELAYED_RELEASE_TABLET | Freq: Every day | ORAL | Status: DC
  Administered 2023-08-08: 81 mg via ORAL
  Filled 2023-08-08: qty 1

## 2023-08-08 MED ORDER — NEBIVOLOL HCL 10 MG PO TABS
10.0000 mg | ORAL_TABLET | Freq: Every day | ORAL | Status: DC
  Administered 2023-08-08 – 2023-08-09 (×2): 10 mg via ORAL
  Filled 2023-08-08 (×2): qty 1

## 2023-08-08 MED ORDER — SODIUM CHLORIDE 0.9 % IV SOLN: INTRAVENOUS | Status: DC

## 2023-08-08 MED ORDER — ONDANSETRON HCL 4 MG PO TABS: 4.0000 mg | ORAL_TABLET | Freq: Four times a day (QID) | ORAL | Status: DC | PRN

## 2023-08-08 MED ORDER — SUCRALFATE 1 GM/10ML PO SUSP
2.0000 g | Freq: Three times a day (TID) | ORAL | Status: DC
  Administered 2023-08-08 – 2023-08-09 (×3): 2 g via ORAL
  Filled 2023-08-08 (×4): qty 20

## 2023-08-08 MED ORDER — AMLODIPINE BESYLATE 5 MG PO TABS
10.0000 mg | ORAL_TABLET | Freq: Every evening | ORAL | Status: DC
  Administered 2023-08-08: 10 mg via ORAL
  Filled 2023-08-08 (×2): qty 2

## 2023-08-08 MED ORDER — NUTRISOURCE FIBER PO PACK
1.0000 | PACK | Freq: Three times a day (TID) | ORAL | Status: DC
  Administered 2023-08-09: 1 via ORAL
  Filled 2023-08-08 (×3): qty 1

## 2023-08-08 MED ORDER — ALPRAZOLAM 0.25 MG PO TABS: 0.2500 mg | ORAL_TABLET | Freq: Every evening | ORAL | Status: DC | PRN

## 2023-08-08 MED ORDER — PANTOPRAZOLE SODIUM 40 MG PO TBEC
40.0000 mg | DELAYED_RELEASE_TABLET | Freq: Every day | ORAL | Status: DC
Start: 2023-08-09 — End: 2023-08-09
  Administered 2023-08-09: 40 mg via ORAL
  Filled 2023-08-08: qty 1

## 2023-08-08 MED ORDER — IRBESARTAN 300 MG PO TABS
300.0000 mg | ORAL_TABLET | Freq: Every day | ORAL | Status: DC
  Administered 2023-08-09: 300 mg via ORAL
  Filled 2023-08-08: qty 1

## 2023-08-08 MED ORDER — POLYETHYLENE GLYCOL 3350 17 G PO PACK
17.0000 g | PACK | Freq: Two times a day (BID) | ORAL | Status: DC
  Administered 2023-08-08 – 2023-08-09 (×2): 17 g via ORAL
  Filled 2023-08-08 (×2): qty 1

## 2023-08-08 MED ORDER — NUTRISOURCE FIBER PO PACK
1.0000 | PACK | Freq: Three times a day (TID) | ORAL | Status: DC
  Administered 2023-08-08: 1
  Filled 2023-08-08: qty 1

## 2023-08-08 MED ORDER — ACETAMINOPHEN 650 MG RE SUPP: 650.0000 mg | Freq: Four times a day (QID) | RECTAL | Status: DC | PRN

## 2023-08-08 MED ORDER — HYDROCHLOROTHIAZIDE 12.5 MG PO TABS
12.5000 mg | ORAL_TABLET | Freq: Every day | ORAL | Status: DC
  Administered 2023-08-09: 12.5 mg via ORAL
  Filled 2023-08-08: qty 1

## 2023-08-08 MED ORDER — ACETAMINOPHEN 500 MG PO TABS
1000.0000 mg | ORAL_TABLET | Freq: Once | ORAL | Status: DC
  Filled 2023-08-08: qty 2

## 2023-08-08 MED ORDER — ENZALUTAMIDE 40 MG PO TABS
160.0000 mg | ORAL_TABLET | Freq: Every evening | ORAL | Status: DC
  Administered 2023-08-08: 160 mg via ORAL
  Filled 2023-08-08 (×3): qty 4

## 2023-08-08 MED ORDER — ENOXAPARIN SODIUM 40 MG/0.4ML IJ SOSY
40.0000 mg | PREFILLED_SYRINGE | INTRAMUSCULAR | Status: DC
  Administered 2023-08-08: 40 mg via SUBCUTANEOUS
  Filled 2023-08-08: qty 0.4

## 2023-08-08 MED ORDER — ATORVASTATIN CALCIUM 40 MG PO TABS
40.0000 mg | ORAL_TABLET | Freq: Every day | ORAL | Status: DC
  Administered 2023-08-08: 40 mg via ORAL
  Filled 2023-08-08: qty 1

## 2023-08-08 MED ORDER — STROKE: EARLY STAGES OF RECOVERY BOOK
Freq: Once | Status: AC
  Filled 2023-08-08: qty 1

## 2023-08-08 MED ORDER — HYDROXYZINE HCL 25 MG PO TABS
25.0000 mg | ORAL_TABLET | Freq: Three times a day (TID) | ORAL | Status: DC | PRN
  Administered 2023-08-09: 25 mg via ORAL
  Filled 2023-08-08: qty 1

## 2023-08-08 MED ORDER — ACETAMINOPHEN 325 MG PO TABS: 650.0000 mg | ORAL_TABLET | Freq: Four times a day (QID) | ORAL | Status: DC | PRN

## 2023-08-08 MED ORDER — OXYCODONE HCL 5 MG PO TABS: 5.0000 mg | ORAL_TABLET | ORAL | Status: DC | PRN

## 2023-08-08 MED ORDER — VALSARTAN-HYDROCHLOROTHIAZIDE 320-12.5 MG PO TABS: 1.0000 | ORAL_TABLET | Freq: Every day | ORAL | Status: DC

## 2023-08-08 MED ORDER — IOHEXOL 350 MG/ML SOLN
75.0000 mL | Freq: Once | INTRAVENOUS | Status: AC | PRN
  Administered 2023-08-08: 75 mL via INTRAVENOUS
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
## 2023-08-08 NOTE — H&P (Signed)
 History and Physical    Kenneth Daniel QMV:[id number] DOB: 05-23-1945 DOA: 08/08/2023  PCP: Renaye Rakers, MD   Chief Complaint:  headache  HPI: Kenneth Daniel is a 78 y.o. male with medical history significant of hypertension, hyperlipidemia, CAD who presents emergency department due to right-sided numbness and weakness.  He presented to the ER which time deficits were gradually improving.  On arrival he was afebrile and hemodynamically stable.  Labs were obtained which showed INR 1.0, WBC 2.6, hemoglobin 13.9, platelets 206, CMP unrevealing.  Patient underwent CT head which showed no acute findings.  MRI brain showed no acute stroke.  CTA head and neck was pending at time of evaluation.  Neurology was consulted and recommended admission for TIA workup.  On admission patient endorsed mild numbness on right upper extremity in setting of laying on his right side.  Also has a history of spinal fusion which may be contributing.   Review of Systems: Review of Systems  Constitutional: Negative.  Negative for chills and fever.  HENT: Negative.    Eyes: Negative.   Respiratory: Negative.    Cardiovascular: Negative.   Gastrointestinal: Negative.   Genitourinary: Negative.   Musculoskeletal: Negative.   Skin: Negative.   Neurological:  Positive for focal weakness. Negative for loss of consciousness.  Endo/Heme/Allergies: Negative.   Psychiatric/Behavioral: Negative.       As per HPI otherwise 10 point review of systems negative.   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, right    per last duplex in epic 06-04-2020  right ICA 16-49% and right ECA >50%   Chronic constipation    GERD (gastroesophageal reflux disease)    History of acute pyelonephritis 09/2020   w/ admission in epic due to severe sepsis   History of COVID-19 11/18/2020   positive result in epic;   per pt mild to moderate symptoms that  resolved   History of external beam radiation therapy    completed in 2015 for recurrent prostate cancer (done in [facility])   History of prostate cancer    (current urologist-- dr Benancio Deeds, and current treatment lupron injeciton q 6months) ;   per pt in [place], [place] 2004  s/p radical prostatectomy;   recurrence 2015 completed radiation   HLD (hyperlipidemia)    Hypertension    followed by pcp and cardiology---   (05-29-2020  nuclear stress test in epic , low risk no ischemia nuclear ef 63%)   Inguinal hernia, right    Non-rheumatic aortic regurgitation    mild to moderate AR without stenosis per last echo in epic 02-27-2021   Perennial allergic rhinitis    [facility]   Wears dentures    upper   Wears glasses    Wears hearing aid in both ears     Past Surgical History:  Procedure Laterality Date   ANTERIOR CERVICAL DECOMP/DISCECTOMY FUSION N/A 11/09/2019   Procedure: ANTERIOR CERVICAL DECOMPRESSION FUSION CERVICAL 3-4 WITH INSTRUMENTATION AND ALLOGRAFT;  Surgeon: Estill Bamberg, MD;  Location: [facility] OR;  Service: Orthopedics;  Laterality: N/A;   CARDIAC CATHETERIZATION  2002   in [place], [place];   per pt stress test with possible ischemia,  told arteries normal   COLONOSCOPY  2020   CYST EXCISION  2005   per pt from back , was benign   INGUINAL HERNIA REPAIR Right 06/04/2021   Procedure: OPEN RIGHT INGUINAL HERNIA  REPAIR;  Surgeon: Berna Bue, MD;  Location: [facility];  Service: General;  Laterality: Right;   PROSTATECTOMY  2004   in [place], [place]   UPPER GI ENDOSCOPY  2021     reports that he quit smoking about 52 years ago. His smoking use included cigarettes. He started smoking about 56 years ago. He has never used smokeless tobacco. He reports that he does not drink alcohol and does not use drugs.  Family History  Problem Relation Age of Onset   Pancreatic cancer Mother    Prostate cancer Father     Prior to Admission  medications   Medication Sig Start Date End Date Taking? Authorizing Provider  acetaminophen (TYLENOL) 500 MG tablet Take 1,000 mg by mouth every 8 (eight) hours as needed for moderate pain (pain score 4-6).   Yes [provider]  ALPRAZolam (XANAX) 0.25 MG tablet Take 0.25 mg by mouth daily as needed. 12/26/21  Yes [provider]  amLODipine (NORVASC) 10 MG tablet Take 10 mg by mouth every evening.   Yes [provider]  aspirin 81 MG EC tablet Take 81 mg by mouth at bedtime.   Yes [provider]  atorvastatin (LIPITOR) 40 MG tablet Take 40 mg by mouth at bedtime.   Yes [provider]  Azelastine HCl 137 MCG/SPRAY SOLN PLACE 2 SPRAYS INTO THE NOSE 2 (TWO) TIMES DAILY AS NEEDED. 04/26/23  Yes Verlee Monte, MD  desloratadine (CLARINEX) 5 MG tablet TAKE 1 TABLET BY MOUTH TWICE A DAY AS NEEDED 08/02/23  Yes Alfonse Spruce, MD  EPINEPHrine 0.3 mg/0.3 mL IJ SOAJ injection Use as directed for life threatening allergic reactions 11/10/22  Yes Verlee Monte, MD  famotidine (PEPCID) 40 MG tablet Take 40 mg by mouth daily.   Yes [provider]  fluticasone (FLONASE) 50 MCG/ACT nasal spray Place 2 sprays into both nostrils daily. 11/10/22  Yes Verlee Monte, MD  hydrOXYzine (VISTARIL) 25 MG capsule Take 1 capsule (25 mg total) by mouth every 8 (eight) hours as needed for anxiety. 12/18/22  Yes Roxy Horseman, PA-C  Magnesium 250 MG TABS Take 250 mg by mouth at bedtime.   Yes [provider]  Melatonin 5 MG CAPS Take 10 mg by mouth at bedtime.   Yes [provider]  nebivolol (BYSTOLIC) 10 MG tablet TAKE 1 TABLET BY MOUTH EVERY DAY IN THE MORNING 04/26/23  Yes Tolia, Sunit, DO  omeprazole (PRILOSEC) 40 MG capsule Take 1 capsule (40 mg total) by mouth in the morning and at bedtime. 12/29/22  Yes Venita Sheffield, MD  Polyethylene Glycol 3350 (MIRALAX PO) Take by mouth daily.   Yes [provider]  sucralfate  (CARAFATE) 1 GM/10ML suspension SMARTSIG:2 Teaspoon By Mouth 4 Times Daily 11/10/22  Yes [provider]  valsartan-hydrochlorothiazide (DIOVAN-HCT) 320-12.5 MG tablet Take 1 tablet by mouth daily. 02/21/20  Yes [provider]  Vitamin D, Ergocalciferol, (DRISDOL) 1.25 MG (50000 UNIT) CAPS capsule Take 50,000 Units by mouth once a week.   Yes [provider]  Wheat Dextrin (BENEFIBER PO) Take by mouth 3 (three) times daily.   Yes [provider]  XTANDI 40 MG tablet Take 160 mg by mouth every evening. 10/19/22  Yes [provider]    Physical Exam: Vitals:   08/08/23 1319 08/08/23 1324 08/08/23 1710  BP: (!) 168/84  (!) 144/77  Pulse: 75  71  Resp: 16  18  Temp: 97.9 F (36.6 C)  (!)  97.4 F (36.3 C)  TempSrc: Oral  Oral  SpO2: 100%  98%  Weight:  78.9 kg   Height:  6\' 2"  (1.88 m)    Physical Exam HENT:     Head: Normocephalic.  Eyes:     Extraocular Movements: Extraocular movements intact.  Cardiovascular:     Rate and Rhythm: Normal rate.     Heart sounds: Normal heart sounds.  Pulmonary:     Effort: Pulmonary effort is normal.     Breath sounds: Normal breath sounds.  Abdominal:     General: Bowel sounds are normal.     Palpations: Abdomen is soft.  Musculoskeletal:        General: Normal range of motion.     Cervical back: Normal range of motion.  Skin:    General: Skin is warm.     Capillary Refill: Capillary refill takes less than 2 seconds.  Neurological:     Mental Status: He is alert and oriented to person, place, and time.     Cranial Nerves: No cranial nerve deficit or facial asymmetry.     Sensory: No sensory deficit.     Motor: No weakness.     Coordination: Coordination normal.  Psychiatric:        Mood and Affect: Mood normal.    Labs on Admission: I have personally reviewed the patients's labs and imaging studies.  Assessment/Plan Principal Problem:   TIA (transient ischemic attack)   # TIA -  Right-sided numbness with gradual resolution - MRI/CT unrevealing - Neurology consulted and recommended admission for further workup  Plan: PT/OT/speech evaluation Continue aspirin atorvastatin Risk stratification Neurochecks Bedside swallow.  # Generalized anxiety-continue Xanax  # History of prostate cancer-continue home medications  # GERD-continue Pepcid, carafate  # Hypertension-continue beta-blocker    Admission status: Observation Telemetry  Certification: The appropriate patient status for this patient is OBSERVATION. Observation status is judged to be reasonable and necessary in order to provide the required intensity of service to ensure the patient's safety. The patient's presenting symptoms, physical exam findings, and initial radiographic and laboratory data in the context of their medical condition is felt to place them at decreased risk for further clinical deterioration. Furthermore, it is anticipated that the patient will be medically stable for discharge from the hospital within 2 midnights of admission.     Alan Mulder MD [facility] If 7PM-7AM, please contact night-coverage [email or web address]  08/08/2023, 5:12 PM

## 2023-08-08 NOTE — Progress Notes (Signed)
 Code Stroke cart activtaed @1333 . LKWT 1030. Pt reports right-sided headache and numbness to right hand, 1st digit. No drift noted. On 81 mg ASA.  Dr. Otelia Limes paged @1335  and on camera @1337 . NCCT negative.  NIH 1. mRS 0.  TSRN dismissed @1403  after returning from CT. No TNK or advanced imaging at this time. Telestroke Charity fundraiser
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
## 2023-08-08 NOTE — ED Notes (Signed)
Pt just returned from MRI.  

## 2023-08-08 NOTE — ED Notes (Signed)
 ED Provider at bedside.
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
## 2023-08-09 ENCOUNTER — Observation Stay (HOSPITAL_BASED_OUTPATIENT_CLINIC_OR_DEPARTMENT_OTHER)

## 2023-08-09 DIAGNOSIS — G459 Transient cerebral ischemic attack, unspecified: Secondary | ICD-10-CM | POA: Diagnosis not present

## 2023-08-09 LAB — ECHOCARDIOGRAM COMPLETE
AR max vel: 3.18 cm2
AV Peak grad: 6.1 mmHg
Ao pk vel: 1.23 m/s
Area-P 1/2: 2.19 cm2
Height: 74 in
MV VTI: 3.5 cm2
P 1/2 time: 446 ms
S' Lateral: 2.7 cm
Weight: 2784 [oz_av]

## 2023-08-09 LAB — LIPID PANEL
Cholesterol: 106 mg/dL (ref 0–200)
HDL: 40 mg/dL — ABNORMAL LOW (ref >40)
LDL Cholesterol: 51 mg/dL (ref 0–99)
Total CHOL/HDL Ratio: 2.7 ratio
Triglycerides: 76 mg/dL (ref <150)
VLDL: 15 mg/dL (ref 0–40)

## 2023-08-09 MED ORDER — CLOPIDOGREL BISULFATE 75 MG PO TABS: 75.0000 mg | ORAL_TABLET | Freq: Every day | ORAL | 0 refills | Status: AC
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
## 2023-08-09 NOTE — TOC Transition Note (Signed)
 Transition of Care [facility]) - Discharge Note   Patient Details  Name: [facility] MRN: [id number] Date of Birth: 1945-12-26  Transition of Care [facility] CM/SW Contact:  Lanier Clam, RN Phone Number: 08/09/2023, 3:44 PM   Clinical Narrative:d/c home no needs.       Final next level of care: Home/Self Care Barriers to Discharge: No Barriers Identified   Patient Goals and CMS Choice            Discharge Placement                       Discharge Plan and Services Additional resources added to the After Visit Summary for                                       Social Drivers of Health (SDOH) Interventions SDOH Screenings   Food Insecurity: No Food Insecurity (08/08/2023)  Housing: Low Risk  (08/08/2023)  Transportation Needs: No Transportation Needs (08/08/2023)  Utilities: Not At Risk (08/08/2023)  Alcohol Screen: Low Risk  (04/03/2023)  Depression (PHQ2-9): Low Risk  (07/07/2023)  Financial Resource Strain: Low Risk  (04/03/2023)  Physical Activity: Insufficiently Active (04/03/2023)  Social Connections: Unknown (08/08/2023)  Stress: No Stress Concern Present (04/03/2023)  Tobacco Use: Medium Risk (08/08/2023)     Readmission Risk Interventions     No data to display

## 2023-08-09 NOTE — Discharge Summary (Signed)
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 72 hours. Hgb A1c No results for input(s): "HGBA1C" in the last 72 hours. Lipid Profile Recent  Labs    08/09/23 0451  CHOL 106  HDL 40*  LDLCALC 51  TRIG 76  CHOLHDL 2.7   Thyroid function studies No results for input(s): "TSH", "T4TOTAL", "T3FREE", "THYROIDAB" in the last 72 hours.  Invalid input(s): "FREET3" Anemia work up No results for input(s): "VITAMINB12", "FOLATE", "FERRITIN", "TIBC", "IRON", "RETICCTPCT" in the last 72 hours. Urinalysis    Component Value Date/Time   COLORURINE STRAW (A) 08/08/2023 1826   APPEARANCEUR CLEAR 08/08/2023 1826   LABSPEC 1.027 08/08/2023 1826   PHURINE 7.0 08/08/2023 1826   GLUCOSEU NEGATIVE 08/08/2023 1826   HGBUR NEGATIVE 08/08/2023 1826   BILIRUBINUR NEGATIVE 08/08/2023 1826   KETONESUR NEGATIVE 08/08/2023 1826   PROTEINUR NEGATIVE 08/08/2023 1826   UROBILINOGEN 0.2 09/20/2021 1325   NITRITE NEGATIVE 08/08/2023 1826   LEUKOCYTESUR NEGATIVE 08/08/2023 1826   Sepsis Labs Recent Labs  Lab 08/08/23 1333  WBC  2.6*   Microbiology No results found for this or any previous visit (from the past 240 hours).   Time coordinating discharge: Over 30 minutes  SIGNED:   Alvira Philips [place], DO  [facility] 08/09/2023, 2:57 PM
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
## 2023-08-09 NOTE — Plan of Care (Signed)

## 2023-08-09 NOTE — Care Management Obs Status (Signed)
 MEDICARE OBSERVATION STATUS NOTIFICATION   Patient Details  Name: Kenneth Daniel MRN: [id number] Date of Birth: [birth year]   Medicare Observation Status Notification Given:  Yes    MahabirOlegario Messier, RN 08/09/2023, 3:44 PM

## 2023-08-10 DIAGNOSIS — S43401A Unspecified sprain of right shoulder joint, initial encounter: Secondary | ICD-10-CM | POA: Diagnosis not present

## 2023-08-10 DIAGNOSIS — M542 Cervicalgia: Secondary | ICD-10-CM | POA: Diagnosis not present

## 2023-08-10 LAB — HEMOGLOBIN A1C
Hgb A1c MFr Bld: 6.1 % — ABNORMAL HIGH (ref 4.8–5.6)
Mean Plasma Glucose: 128 mg/dL

## 2023-08-11 DIAGNOSIS — M79602 Pain in left arm: Secondary | ICD-10-CM | POA: Diagnosis not present

## 2023-08-11 DIAGNOSIS — Y32XXXA Crashing of motor vehicle, undetermined intent, initial encounter: Secondary | ICD-10-CM | POA: Diagnosis not present

## 2023-08-11 DIAGNOSIS — R519 Headache, unspecified: Secondary | ICD-10-CM | POA: Diagnosis not present

## 2023-08-12 DIAGNOSIS — M19011 Primary osteoarthritis, right shoulder: Secondary | ICD-10-CM | POA: Diagnosis not present

## 2023-08-12 DIAGNOSIS — S161XXD Strain of muscle, fascia and tendon at neck level, subsequent encounter: Secondary | ICD-10-CM | POA: Diagnosis not present

## 2023-08-13 ENCOUNTER — Ambulatory Visit (INDEPENDENT_AMBULATORY_CARE_PROVIDER_SITE_OTHER)

## 2023-08-13 DIAGNOSIS — J309 Allergic rhinitis, unspecified: Secondary | ICD-10-CM | POA: Diagnosis not present

## 2023-08-16 ENCOUNTER — Ambulatory Visit: Admitting: Licensed Clinical Social Worker

## 2023-08-18 DIAGNOSIS — M19011 Primary osteoarthritis, right shoulder: Secondary | ICD-10-CM | POA: Diagnosis not present

## 2023-08-18 DIAGNOSIS — S161XXD Strain of muscle, fascia and tendon at neck level, subsequent encounter: Secondary | ICD-10-CM | POA: Diagnosis not present

## 2023-08-23 DIAGNOSIS — H903 Sensorineural hearing loss, bilateral: Secondary | ICD-10-CM | POA: Diagnosis not present

## 2023-08-24 DIAGNOSIS — S161XXD Strain of muscle, fascia and tendon at neck level, subsequent encounter: Secondary | ICD-10-CM | POA: Diagnosis not present

## 2023-08-24 DIAGNOSIS — M19011 Primary osteoarthritis, right shoulder: Secondary | ICD-10-CM | POA: Diagnosis not present

## 2023-08-26 DIAGNOSIS — C61 Malignant neoplasm of prostate: Secondary | ICD-10-CM | POA: Diagnosis not present

## 2023-08-26 DIAGNOSIS — M19011 Primary osteoarthritis, right shoulder: Secondary | ICD-10-CM | POA: Diagnosis not present

## 2023-08-26 DIAGNOSIS — S161XXD Strain of muscle, fascia and tendon at neck level, subsequent encounter: Secondary | ICD-10-CM | POA: Diagnosis not present

## 2023-08-30 ENCOUNTER — Ambulatory Visit: Admitting: Licensed Clinical Social Worker

## 2023-08-30 DIAGNOSIS — S161XXD Strain of muscle, fascia and tendon at neck level, subsequent encounter: Secondary | ICD-10-CM | POA: Diagnosis not present

## 2023-08-30 DIAGNOSIS — L218 Other seborrheic dermatitis: Secondary | ICD-10-CM | POA: Diagnosis not present

## 2023-08-30 DIAGNOSIS — L821 Other seborrheic keratosis: Secondary | ICD-10-CM | POA: Diagnosis not present

## 2023-08-30 DIAGNOSIS — M19011 Primary osteoarthritis, right shoulder: Secondary | ICD-10-CM | POA: Diagnosis not present

## 2023-08-31 DIAGNOSIS — F419 Anxiety disorder, unspecified: Secondary | ICD-10-CM | POA: Diagnosis not present

## 2023-08-31 DIAGNOSIS — R739 Hyperglycemia, unspecified: Secondary | ICD-10-CM | POA: Diagnosis not present

## 2023-08-31 DIAGNOSIS — F064 Anxiety disorder due to known physiological condition: Secondary | ICD-10-CM | POA: Diagnosis not present

## 2023-08-31 DIAGNOSIS — E785 Hyperlipidemia, unspecified: Secondary | ICD-10-CM | POA: Diagnosis not present

## 2023-08-31 DIAGNOSIS — L218 Other seborrheic dermatitis: Secondary | ICD-10-CM | POA: Diagnosis not present

## 2023-08-31 DIAGNOSIS — R7309 Other abnormal glucose: Secondary | ICD-10-CM | POA: Diagnosis not present

## 2023-08-31 DIAGNOSIS — I10 Essential (primary) hypertension: Secondary | ICD-10-CM | POA: Diagnosis not present

## 2023-08-31 DIAGNOSIS — M25511 Pain in right shoulder: Secondary | ICD-10-CM | POA: Diagnosis not present

## 2023-08-31 DIAGNOSIS — E559 Vitamin D deficiency, unspecified: Secondary | ICD-10-CM | POA: Diagnosis not present

## 2023-09-02 DIAGNOSIS — N281 Cyst of kidney, acquired: Secondary | ICD-10-CM | POA: Diagnosis not present

## 2023-09-02 DIAGNOSIS — S161XXD Strain of muscle, fascia and tendon at neck level, subsequent encounter: Secondary | ICD-10-CM | POA: Diagnosis not present

## 2023-09-02 DIAGNOSIS — C7951 Secondary malignant neoplasm of bone: Secondary | ICD-10-CM | POA: Diagnosis not present

## 2023-09-02 DIAGNOSIS — C61 Malignant neoplasm of prostate: Secondary | ICD-10-CM | POA: Diagnosis not present

## 2023-09-02 DIAGNOSIS — M19011 Primary osteoarthritis, right shoulder: Secondary | ICD-10-CM | POA: Diagnosis not present

## 2023-09-06 DIAGNOSIS — M19011 Primary osteoarthritis, right shoulder: Secondary | ICD-10-CM | POA: Diagnosis not present

## 2023-09-06 DIAGNOSIS — S161XXD Strain of muscle, fascia and tendon at neck level, subsequent encounter: Secondary | ICD-10-CM | POA: Diagnosis not present

## 2023-09-09 ENCOUNTER — Ambulatory Visit (INDEPENDENT_AMBULATORY_CARE_PROVIDER_SITE_OTHER): Payer: Self-pay

## 2023-09-09 DIAGNOSIS — M19011 Primary osteoarthritis, right shoulder: Secondary | ICD-10-CM | POA: Diagnosis not present

## 2023-09-09 DIAGNOSIS — S161XXD Strain of muscle, fascia and tendon at neck level, subsequent encounter: Secondary | ICD-10-CM | POA: Diagnosis not present

## 2023-09-09 DIAGNOSIS — J309 Allergic rhinitis, unspecified: Secondary | ICD-10-CM | POA: Diagnosis not present

## 2023-09-09 DIAGNOSIS — M25511 Pain in right shoulder: Secondary | ICD-10-CM | POA: Diagnosis not present

## 2023-09-14 DIAGNOSIS — F419 Anxiety disorder, unspecified: Secondary | ICD-10-CM | POA: Diagnosis not present

## 2023-09-14 DIAGNOSIS — E785 Hyperlipidemia, unspecified: Secondary | ICD-10-CM | POA: Diagnosis not present

## 2023-09-14 DIAGNOSIS — I10 Essential (primary) hypertension: Secondary | ICD-10-CM | POA: Diagnosis not present

## 2023-09-14 DIAGNOSIS — F064 Anxiety disorder due to known physiological condition: Secondary | ICD-10-CM | POA: Diagnosis not present

## 2023-09-16 DIAGNOSIS — M75111 Incomplete rotator cuff tear or rupture of right shoulder, not specified as traumatic: Secondary | ICD-10-CM | POA: Diagnosis not present

## 2023-09-16 DIAGNOSIS — H903 Sensorineural hearing loss, bilateral: Secondary | ICD-10-CM | POA: Diagnosis not present

## 2023-09-20 ENCOUNTER — Ambulatory Visit (INDEPENDENT_AMBULATORY_CARE_PROVIDER_SITE_OTHER): Admitting: Licensed Clinical Social Worker

## 2023-09-20 DIAGNOSIS — F4323 Adjustment disorder with mixed anxiety and depressed mood: Secondary | ICD-10-CM
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
## 2023-09-29 DIAGNOSIS — M75111 Incomplete rotator cuff tear or rupture of right shoulder, not specified as traumatic: Secondary | ICD-10-CM | POA: Diagnosis not present

## 2023-09-30 DIAGNOSIS — I10 Essential (primary) hypertension: Secondary | ICD-10-CM | POA: Diagnosis not present

## 2023-09-30 DIAGNOSIS — F32A Depression, unspecified: Secondary | ICD-10-CM | POA: Diagnosis not present

## 2023-09-30 DIAGNOSIS — L218 Other seborrheic dermatitis: Secondary | ICD-10-CM | POA: Diagnosis not present

## 2023-09-30 DIAGNOSIS — F419 Anxiety disorder, unspecified: Secondary | ICD-10-CM | POA: Diagnosis not present

## 2023-09-30 DIAGNOSIS — E7800 Pure hypercholesterolemia, unspecified: Secondary | ICD-10-CM | POA: Diagnosis not present

## 2023-10-04 ENCOUNTER — Ambulatory Visit (INDEPENDENT_AMBULATORY_CARE_PROVIDER_SITE_OTHER): Admitting: Licensed Clinical Social Worker

## 2023-10-04 DIAGNOSIS — F4323 Adjustment disorder with mixed anxiety and depressed mood: Secondary | ICD-10-CM | POA: Diagnosis not present

## 2023-10-05 NOTE — Progress Notes (Signed)
 [facility] Counselor/Therapist Progress Note  Patient ID: NEITHAN DAY, MRN: [id number]    Date: 10/04/2023  Time Spent: 0407  pm - 0500 pm : 53 Minutes  Treatment Type: Individual Therapy.  Reported Symptoms: Patient reports anxiety and depression due to a recent illness and his fear of something bad happening. Patient reports having stomach issues that previously left him anxious when traveling which is something he enjoys.   Mental Status Exam: Appearance:  Casual     Behavior: Appropriate  Motor: Normal  Speech/Language:  Clear and Coherent  Affect: Appropriate  Mood: normal  Thought process: normal  Thought content:   WNL  Sensory/Perceptual disturbances:   WNL  Orientation: oriented to person, place, time/date, situation, day of week, month of year, and year  Attention: Good  Concentration: Good  Memory: WNL  Fund of knowledge:  Good  Insight:   Good  Judgment:  Good  Impulse Control: Good    Risk Assessment: Danger to Self:  No Self-injurious Behavior: No Danger to Others: No Duty to Warn:no Physical Aggression / Violence:No  Access to Firearms a concern: No  Gang Involvement:No    Subjective:  [name] participated from office, located at [facility] with Clinician present. Talmadge consented to treatment.     Frantz presented for his session in a good mood. He reports that he leaves tomorrow for [facility] for his nieces graduation. Patient reports that he is nervous about the flight but will remember the 3-3-3 rule that we discussed. He reports that it was very helpful during his last flight. Patient discussed his hearing surgery again and his hopefulness that it will be a success. Patient states he was chosen to participate in several events at the state level of the [employer]. Patient reports that he hopes the hearing surgery won't have an affect on his ability to participate. Lian was optimistic about his surgery and states that he  looks forward to being able to hear well.  Clinician provided verbal support via feedback and interaction with patient. Clinician processed patient concerns with him and encouraged him to also utilize the following:    1. Relaxation Techniques: Deep breathing: . Practice slow, deep breaths to calm your nervous system and reduce anxiety. Techniques like box breathing (inhale for 4 seconds, hold for 4, exhale for 4, hold for 4) can be particularly helpful.  Meditation and Mindfulness: . Engage in mindfulness practices to center your thoughts and reduce anxious feelings.  Guided meditations: Aaron Aas Download relaxation or guided meditation playlists on your phone for access during the flight.  2. Distraction and Engagement: Bring entertainment: [employer], shows, or podcasts to keep your mind occupied.  Engage in activities: Read a book, listen to music, or play games.  Talk to flight attendants: If you feel comfortable, talking to the crew can provide reassurance and address specific concerns.  3. Understanding and Education: Learn about turbulence: Understand that turbulence is a normal part of flying and that planes are designed to handle it.  Study flight statistics: Familiarize yourself with the safety record of air travel, which is remarkably good.  Talk to your doctor: If your anxiety is severe, consider consulting with a doctor or therapist who can recommend appropriate treatments like cognitive behavioral therapy (CBT) or medication.  4. Practical Tips: Choose your seat wisely: Consider seating closer to the front of the plane, which generally experiences less turbulence.  Pack comfort items: Bring a blanket, pillow, or other items that help you  feel more comfortable.  Stay hydrated and eat healthy: Avoid excessive caffeine or alcohol, which can exacerbate anxiety.  5. Addressing the Root Cause: Identify triggers: Understand what specifically triggers your anxiety, such as  specific times during the flight (takeoff, landing, turbulence) or certain environments (airport terminals).   Zayne was fully engaged in session and was motivated for treatment. Patient identified which coping skills are helpful and states he will utilize them when his anxiety is heightened. Patient agreed to discuss with his provider completing his surgery his concern over competing in his Senior Games. Patient will continue to engage in bi weekly therapy sessions. Treatment plan will be reviewed by 12/28/2023.  Cognitive Behavioral Therapy, Motivational Interviewing and psycho education. Clinician conducted session in person at clinician's office at [facility]. Reviewed events since last session. Assessed patient's mood since last session and current mood. Clinician reviewed diagnoses and treatment recommendations. Provided psycho education related to diagnoses and treatment.    Diagnosis: Adjustment Disorder with mixed anxiety and depression  [name], LCSW/DATE 10/04/2023

## 2023-10-06 DIAGNOSIS — M75111 Incomplete rotator cuff tear or rupture of right shoulder, not specified as traumatic: Secondary | ICD-10-CM | POA: Diagnosis not present

## 2023-10-07 ENCOUNTER — Ambulatory Visit (INDEPENDENT_AMBULATORY_CARE_PROVIDER_SITE_OTHER): Payer: Self-pay

## 2023-10-07 DIAGNOSIS — J309 Allergic rhinitis, unspecified: Secondary | ICD-10-CM | POA: Diagnosis not present

## 2023-10-07 DIAGNOSIS — M75111 Incomplete rotator cuff tear or rupture of right shoulder, not specified as traumatic: Secondary | ICD-10-CM | POA: Diagnosis not present

## 2023-10-11 DIAGNOSIS — J302 Other seasonal allergic rhinitis: Secondary | ICD-10-CM | POA: Diagnosis not present

## 2023-10-11 NOTE — Progress Notes (Signed)
 VIALS MADE 10-11-23

## 2023-10-12 DIAGNOSIS — M75111 Incomplete rotator cuff tear or rupture of right shoulder, not specified as traumatic: Secondary | ICD-10-CM | POA: Diagnosis not present

## 2023-10-12 DIAGNOSIS — J3089 Other allergic rhinitis: Secondary | ICD-10-CM | POA: Diagnosis not present

## 2023-10-13 ENCOUNTER — Encounter: Admitting: Sports Medicine

## 2023-10-13 DIAGNOSIS — M75111 Incomplete rotator cuff tear or rupture of right shoulder, not specified as traumatic: Secondary | ICD-10-CM | POA: Diagnosis not present

## 2023-10-14 DIAGNOSIS — M75111 Incomplete rotator cuff tear or rupture of right shoulder, not specified as traumatic: Secondary | ICD-10-CM | POA: Diagnosis not present

## 2023-10-14 NOTE — Progress Notes (Signed)
 This encounter was created in error - please disregard.

## 2023-10-18 ENCOUNTER — Ambulatory Visit (INDEPENDENT_AMBULATORY_CARE_PROVIDER_SITE_OTHER): Admitting: Licensed Clinical Social Worker

## 2023-10-18 DIAGNOSIS — F4323 Adjustment disorder with mixed anxiety and depressed mood: Secondary | ICD-10-CM | POA: Diagnosis not present

## 2023-10-18 DIAGNOSIS — M75111 Incomplete rotator cuff tear or rupture of right shoulder, not specified as traumatic: Secondary | ICD-10-CM | POA: Diagnosis not present

## 2023-10-19 DIAGNOSIS — K219 Gastro-esophageal reflux disease without esophagitis: Secondary | ICD-10-CM | POA: Diagnosis not present

## 2023-10-19 DIAGNOSIS — K5900 Constipation, unspecified: Secondary | ICD-10-CM | POA: Diagnosis not present

## 2023-10-19 NOTE — Progress Notes (Signed)
 [facility] [facility] Counselor/Therapist Progress Note  Patient ID: Kenneth Daniel, MRN: [id number]    Date: 10/18/2023  Time Spent: 405  pm - 450 pm : 45 Minutes  Treatment Type: Individual Therapy.  Reported Symptoms: Patient reports anxiety and depression due to a recent illness and his fear of something bad happening. Patient reports having stomach issues that previously left him anxious when traveling which is something he enjoys.   Mental Status Exam: Appearance:  Casual     Behavior: Appropriate  Motor: Normal  Speech/Language:  Clear and Coherent  Affect: Appropriate  Mood: normal  Thought process: normal  Thought content:   WNL  Sensory/Perceptual disturbances:   WNL  Orientation: oriented to person, place, time/date, situation, day of week, month of year, and year  Attention: Good  Concentration: Good  Memory: WNL  Fund of knowledge:  Good  Insight:   Good  Judgment:  Good  Impulse Control: Good    Risk Assessment: Danger to Self:  No Self-injurious Behavior: No Danger to Others: No Duty to Warn:no Physical Aggression / Violence:No  Access to Firearms a concern: No  Gang Involvement:No    Subjective:  Kenneth Daniel participated from office, located at [facility] with Clinician present. Tu consented to treatment.    Kenneth Daniel presented for his session in a positive mood reporting that he had a great time in [place] at his nieces graduation. Patient reports it was hot but he enjoyed seeing family and friends. Patient reports that he went to [employer] and showed [employer] of him meeting with him. Patient reports having anxiety about flying but reports he continues to utilize the 3-3-3 method to assist in calming down. Patient reports that he has another upcoming trip to [place]  for a high school reunion. He reports that he will be flying for that too and continue to have anxiety about the flight.   Clinician actively listened and  processed with patient his use of the 3-3-3 method and how he feels it works for him. Clinician also discussed with patient re framing his thoughts about his flight and the things that he is thinking that are negative. Clinician and patient processed ways to re frame thoughts and examples were discussed. Clinician and patient also processed the importance of enjoying life and not allowing our fears to steal our joy and memories with those we love.   Kenneth Daniel was fully engaged in discussion during session and evidenced understanding via his verbal interaction during session. Patient is motivated for treatment and is always willing to utilize tools and skills provided to him during session. Patient continues to have anxiety about flying and states that he isn't sure why this has become an issue in the past year. Patient did agree is excited to see his classmates and is looking forward to being there. Patient identified that his desire to engage in activities keeps him facing his fear of flying. Kenneth Daniel was pleasant and cooperative and will continue to engage in bi weekly therapy. Treatment plan to be reviewed by 12/27/2023. Patient is to use CBT, mindfulness and coping skills to help manage decrease symptoms associated with their diagnosis.    Interventions: Cognitive Behavioral Therapy, Motivational Interviewing and psycho education. Clinician conducted session in person at clinician's office at [facility]. Reviewed events since last session. Assessed patient's mood since last session and current mood. Clinician reviewed diagnoses and treatment recommendations. Provided psycho education related to diagnoses and treatment.   Diagnosis: Adjustment Disorder with  mixed Anxiety and depressed mood.   Kenneth Daniel MSW, LCSW/DATE 10/18/2023

## 2023-10-20 DIAGNOSIS — M75111 Incomplete rotator cuff tear or rupture of right shoulder, not specified as traumatic: Secondary | ICD-10-CM | POA: Diagnosis not present

## 2023-10-26 DIAGNOSIS — M75111 Incomplete rotator cuff tear or rupture of right shoulder, not specified as traumatic: Secondary | ICD-10-CM | POA: Diagnosis not present

## 2023-10-27 ENCOUNTER — Ambulatory Visit: Admitting: Licensed Clinical Social Worker

## 2023-11-03 ENCOUNTER — Other Ambulatory Visit: Payer: Self-pay | Admitting: Sports Medicine

## 2023-11-03 DIAGNOSIS — R634 Abnormal weight loss: Secondary | ICD-10-CM

## 2023-11-03 DIAGNOSIS — K219 Gastro-esophageal reflux disease without esophagitis: Secondary | ICD-10-CM

## 2023-11-08 ENCOUNTER — Ambulatory Visit (INDEPENDENT_AMBULATORY_CARE_PROVIDER_SITE_OTHER)

## 2023-11-08 DIAGNOSIS — J309 Allergic rhinitis, unspecified: Secondary | ICD-10-CM

## 2023-11-10 ENCOUNTER — Ambulatory Visit (INDEPENDENT_AMBULATORY_CARE_PROVIDER_SITE_OTHER): Admitting: Licensed Clinical Social Worker

## 2023-11-10 DIAGNOSIS — F4323 Adjustment disorder with mixed anxiety and depressed mood: Secondary | ICD-10-CM | POA: Diagnosis not present

## 2023-11-11 DIAGNOSIS — K4030 Unilateral inguinal hernia, with obstruction, without gangrene, not specified as recurrent: Secondary | ICD-10-CM | POA: Diagnosis not present

## 2023-11-11 DIAGNOSIS — I10 Essential (primary) hypertension: Secondary | ICD-10-CM | POA: Diagnosis not present

## 2023-11-11 DIAGNOSIS — F419 Anxiety disorder, unspecified: Secondary | ICD-10-CM | POA: Diagnosis not present

## 2023-11-11 DIAGNOSIS — R609 Edema, unspecified: Secondary | ICD-10-CM | POA: Diagnosis not present

## 2023-11-11 DIAGNOSIS — F32A Depression, unspecified: Secondary | ICD-10-CM | POA: Diagnosis not present

## 2023-11-11 NOTE — Progress Notes (Signed)
 [facility] [facility] Counselor/Therapist Progress Note  Patient ID: Kenneth Daniel, MRN: [id number]    Date: 0709/2025  Time Spent: 0406  pm - 0500 pm : 54 Minutes  Treatment Type: Individual Therapy.  Reported Symptoms: Patient reports anxiety and depression due to a recent illness and his fear of something bad happening. Patient reports having stomach issues that previously left him anxious when traveling which is something he enjoys.   Mental Status Exam: Appearance:  Casual     Behavior: Appropriate  Motor: Normal  Speech/Language:  Clear and Coherent  Affect: Appropriate  Mood: normal  Thought process: normal  Thought content:   WNL  Sensory/Perceptual disturbances:   WNL  Orientation: oriented to person, place, time/date, situation, day of week, month of year, and year  Attention: Good  Concentration: Good  Memory: WNL  Fund of knowledge:  Good  Insight:   Good  Judgment:  Good  Impulse Control: Good    Risk Assessment: Danger to Self:  No Self-injurious Behavior: No Danger to Others: No Duty to Warn:no Physical Aggression / Violence:No  Access to Firearms a concern: No  Gang Involvement:No    Subjective:  Kenneth Daniel participated from office, located at [facility] with Clinician present. Kenneth Daniel consented to treatment.   Kenneth Daniel presented for his session in a positive mood. Kenneth Daniel states that he got back from his trip to [place]  earlier this week. Patient reports that he enjoyed seeing old classmates and the activities. Patient reports that he had little time for down time due to there being activities planned around the clock. Kenneth Daniel shared that he always enjoys these social opportunities. Patient reports that he is utilizing his coping skill while flying to assist with decreasing the anxiety of flying. Patient also reports that he is using re framing of his thoughts as a way to improve positive thought. Patient reports that he is getting anxious  about his upcoming hearing surgery. He reports that he is is hopeful that he will be able to hear better but also recognizes that it will be an extensive recovery.   Clinician provided support and encouragement to patient via active listening and verbal interaction. Clinician processed with patient his fear of flying and how he has continued to face his fears and not allow the fear to prevent him from enjoying his life and the things he enjoys socially. Clinician also processed with patient his upcoming procedure in August. Clinician and patient processed his plan to have his sister assist him and how he has all things planned out ahead of time to assure a stress free recovery period.   Kenneth Daniel was fully engaged in discussion. Kenneth Daniel will continue to utilize coping skills to assist with his anxiety. Kenneth Daniel will continue to engage in bi weekly therapy sessions. Kenneth Daniel was polite and cooperative and motivated for treatment. Treatment planning will be reviewed by  12/28/2023.   Interventions: Cognitive Behavioral Therapy, Motivational Interviewing and psycho education. Clinician conducted session in person at clinician's office at [facility]. Reviewed events since last session. Assessed patient's mood since last session and current mood. Clinician reviewed diagnoses and treatment recommendations. Provided psycho education related to diagnoses and treatment.   Diagnosis: Adjustment Disorder with mixed anxiety and depression   [name] Junk MSW, LCSW/DATE 11/10/2023

## 2023-11-30 ENCOUNTER — Ambulatory Visit (INDEPENDENT_AMBULATORY_CARE_PROVIDER_SITE_OTHER): Admitting: Licensed Clinical Social Worker

## 2023-11-30 DIAGNOSIS — F4323 Adjustment disorder with mixed anxiety and depressed mood: Secondary | ICD-10-CM | POA: Insufficient documentation

## 2023-11-30 NOTE — Progress Notes (Signed)
 [facility] [facility] Counselor/Therapist Progress Note  Patient ID: Kenneth Daniel, MRN: [id number]    Date: 11/30/23  Time Spent: 0118  pm - 0200 pm : 42 Minutes  Treatment Type: Individual Therapy.  Reported Symptoms: Patient reports anxiety and depression due to a recent illness and his fear of something bad happening. Patient reports having stomach issues that previously left him anxious when traveling which is something he enjoys.   Mental Status Exam: Appearance:  Casual     Behavior: Appropriate  Motor: Normal  Speech/Language:  Clear and Coherent  Affect: Appropriate  Mood: normal  Thought process: normal  Thought content:   WNL  Sensory/Perceptual disturbances:   WNL  Orientation: oriented to person, place, time/date, situation, day of week, month of year, and year  Attention: Good  Concentration: Good  Memory: WNL  Fund of knowledge:  Good  Insight:   Good  Judgment:  Good  Impulse Control: Good    Risk Assessment: Danger to Self:  No Self-injurious Behavior: No Danger to Others: No Duty to Warn:no Physical Aggression / Violence:No  Access to Firearms a concern: No  Gang Involvement:No    Subjective:  Kenneth Daniel participated from office, located at [facility] with Clinician present. Kenneth Daniel consented to treatment.   Kenneth Daniel presented for his session late. He reported that he overslept. Patient reports that he hasn't been sleeping well and is concerned about how tired he feels. Patient reports that his trip to [place] was overwhelming and stressful. He reports that once he got home he felt more calm and relaxed. He reports that he has misplaced or lost his credit card and is concerned about that too. Patient states he called the past place he used it but they don't have it. Patient reports that he feels that may be on his mind too, as well as his upcoming hearing procedure.  Clinician actively listened and processed with patient his concerns.  Clinician encouraged patient to call his card company and verify that it hasn't been used. Clinician also encouraged patient to call the last place he used it again just to make sure that it hasn't been turned in. Clinician processed with patient going home and making those calls and then going to bed to rest. Clinician encouraged patient that if he feels poorly tomorrow to make him an appointment with his PCP.   Kenneth Daniel was fully engaged in session. He shared details of his trip and his frustration of the flight and the [place] while in [place]. Kenneth Daniel was motivated for treatment and was transparent about his worry about his upcoming hearing procedure. Kenneth Daniel will continue to utilize coping skills to decrease his anxiety and depression. Kenneth Daniel will continue to engage in bi weekly therapy sessions. Treatment planning to be reviewed by 12/28/2023.   Interventions: Cognitive Behavioral Therapy, Assertiveness/Communication, Mindfulness Meditation, Motivational Interviewing, and Solution-Oriented/Positive Psychology  Diagnosis: Adjustment Disorder with mixed anxiety and depressed mood  [name] Junk MSW, LCSW/DATE 11/30/2023

## 2023-12-02 ENCOUNTER — Other Ambulatory Visit: Payer: Self-pay | Admitting: Internal Medicine

## 2023-12-02 DIAGNOSIS — J019 Acute sinusitis, unspecified: Secondary | ICD-10-CM | POA: Diagnosis not present

## 2023-12-06 ENCOUNTER — Other Ambulatory Visit: Payer: Self-pay

## 2023-12-06 ENCOUNTER — Encounter: Payer: Self-pay | Admitting: Internal Medicine

## 2023-12-06 ENCOUNTER — Ambulatory Visit (INDEPENDENT_AMBULATORY_CARE_PROVIDER_SITE_OTHER): Admitting: Internal Medicine

## 2023-12-06 VITALS — BP 138/76 | HR 82 | Temp 98.2°F | Ht 73.5 in | Wt 174.1 lb

## 2023-12-06 DIAGNOSIS — B9689 Other specified bacterial agents as the cause of diseases classified elsewhere: Secondary | ICD-10-CM | POA: Diagnosis not present

## 2023-12-06 DIAGNOSIS — L299 Pruritus, unspecified: Secondary | ICD-10-CM | POA: Diagnosis not present

## 2023-12-06 DIAGNOSIS — J3089 Other allergic rhinitis: Secondary | ICD-10-CM

## 2023-12-06 DIAGNOSIS — J019 Acute sinusitis, unspecified: Secondary | ICD-10-CM | POA: Diagnosis not present

## 2023-12-06 MED ORDER — DESLORATADINE 5 MG PO TABS: 5.0000 mg | ORAL_TABLET | Freq: Every day | ORAL | 2 refills | Status: AC | PRN

## 2023-12-06 MED ORDER — AMOXICILLIN-POT CLAVULANATE 875-125 MG PO TABS: 1.0000 | ORAL_TABLET | Freq: Two times a day (BID) | ORAL | 0 refills | Status: AC

## 2023-12-06 MED ORDER — AZELASTINE HCL 137 MCG/SPRAY NA SOLN: 1.0000 | Freq: Two times a day (BID) | NASAL | 11 refills | Status: DC | PRN

## 2023-12-06 MED ORDER — FLUTICASONE PROPIONATE 50 MCG/ACT NA SUSP: 2.0000 | Freq: Every day | NASAL | 11 refills | Status: AC

## 2023-12-06 MED ORDER — DESLORATADINE 5 MG PO TABS: 5.0000 mg | ORAL_TABLET | Freq: Two times a day (BID) | ORAL | 2 refills | Status: DC | PRN

## 2023-12-06 MED ORDER — EPINEPHRINE 0.3 MG/0.3ML IJ SOAJ: 0.3000 mg | INTRAMUSCULAR | 1 refills | Status: AC | PRN

## 2023-12-06 NOTE — Patient Instructions (Addendum)
 Allergic rhinitis (grasses, indoor molds, dust mites) with acute sinusitis -stop azithromycin -start augmentin  875 mg twice daily x 7 days - saline rinses twice daily for the next 5-7 days - Continue fluticasone  1 spray in each nostril twice a day as needed for stuffy nose.   - Continue azelastine  2 sprays in each nostril twice a day as needed for runny nose or itch. - Continue Clarinex  5 mg 1-2 times daily. - Consider saline nasal rinses as needed for nasal symptoms.  - Continue allergy injections and bring your uptodate epinephrine  auoinjector with you to all appointments. Hold on your allergy injections until feeling better.  2. Pruritus - Continue a twice a day moisturizing routine - Continue Clarinex  once a day as needed for itch (as listed above). - Continue with triamcinolone  cream to use as needed (avoid the face).  3. Return in about 1 year (around 12/05/2024).    Please inform us  of any Emergency Department visits, hospitalizations, or changes in symptoms. Call us  before going to the ED for breathing or allergy symptoms since we might be able to fit you in for a sick visit. Feel free to contact us  anytime with any questions, problems, or concerns.  It was a pleasure to see you again today!  Websites that have reliable patient information: 1. American Academy of Asthma, Allergy, and Immunology: www.aaaai.org 2. Food Allergy Research and Education (FARE): foodallergy.org 3. Mothers of Asthmatics: http://www.asthmacommunitynetwork.org 4. American College of Allergy, Asthma, and Immunology: www.acaai.org

## 2023-12-06 NOTE — Progress Notes (Signed)
 FOLLOW UP Date of Service/Encounter:   12/06/2023  Subjective:  Kenneth Daniel (DOB: 1946/02/12) is a 78 y.o. male who [facility] in re-evaluation of the following:  allergic rhinitis, pruritus History obtained from: chart review and patient.  For Review, LV was on 11/10/22  with Dr.Ethan Kasperski seen for routine follow-up. See below for summary of history and diagnostics.   Therapeutic plans/changes recommended: doing well, injections continued ----------------------------------------------------- Pertinent History/Diagnostics:  Allergic rhinitis On AIT-2 vials; vial 1 grass pollen, dust mites, and vial 2: mold; reached full maintenance 12/10/22-(0.5 mL of red vial) Injections reported as helping. OV 11/10/22: pruritus resolved.  --------------------------------------------------- Today presents for follow-up. Discussed the use of AI scribe software for clinical note transcription with the patient, who gave verbal consent to proceed.  History of Present Illness Kenneth Daniel is a 78 year old male who presents with symptoms of a sinus infection.  Upper respiratory symptoms - Onset after returning from a family reunion in [place] - Symptoms began Tuesday of last week, worsening by Thursday and Friday - Initial symptoms included malaise without sneezing or coughing; sneezing and coughing developed later - Current symptoms include sneezing, sore throat, significant watery and clear nasal drainage, facial pressure, and headaches - Changes in taste and smell - No fever - Overall malaise persists  Sinus infection history and treatment response - History of recurrent sinus infections - Typically experiences relief within 2-3 days of starting antibiotics, which has not occurred with current episode - Evaluated by primary care provider's assistant on Friday - Prescribed azithromycin (Z-Pak): two tablets initially, then one daily; scheduled to  complete course today, last dose not yet taken at time of visit - Minimal improvement with current antibiotic regimen  Current medication use - Fluticasone  nasal spray: one spray in the morning and one at night - Azelastine  nasal spray: used this morning - No antibiotic allergies   All medications reviewed by clinical staff and updated in chart. No new pertinent medical or surgical history except as noted in HPI.  ROS: All others negative except as noted per HPI.   Objective:  BP 138/76 (BP Location: Left Arm, Patient Position: Sitting, Cuff Size: Normal)   Pulse 82   Temp 98.2 F (36.8 C) (Temporal)   Ht 6' 1.5 (1.867 m)   Wt 174 lb 1.6 oz (79 kg)   SpO2 97%   BMI 22.66 kg/m  Body mass index is 22.66 kg/m. Physical Exam: General Appearance:  Alert, cooperative, no distress, appears stated age  Head:  Normocephalic, without obvious abnormality, atraumatic  Eyes:  Conjunctiva clear, EOM's intact  Ears EACs normal bilaterally and normal TMs bilaterally  Nose: Nares normal, hypertrophic turbinates, normal mucosa, and no visible anterior polyps  Throat: Lips, tongue normal; teeth and gums normal, normal posterior oropharynx  Neck: Supple, symmetrical  Lungs:   clear to auscultation bilaterally, Respirations unlabored, no coughing  Heart:  regular rate and rhythm and no murmur, Appears well perfused  Extremities: No edema  Skin: Skin color, texture, turgor normal and no rashes or lesions on visualized portions of skin  Neurologic: No gross deficits   Labs:  Lab Orders  No laboratory test(s) ordered today    Assessment/Plan   Allergic rhinitis (grasses, indoor molds, dust mites) with acute sinusitis-on AIT with improvement in allergic rhiniits, current sinusitis suspected related to travel -stop azithromycin -start augmentin  875 mg twice daily x 7 days - saline rinses twice daily for the next  5-7 days - Continue fluticasone  1 spray in each nostril twice a day as needed  for stuffy nose.   - Continue azelastine  2 sprays in each nostril twice a day as needed for runny nose or itch. - Continue Clarinex  5 mg 1-2 times daily. - Consider saline nasal rinses as needed for nasal symptoms.  - Continue allergy injections and bring your uptodate epinephrine  auoinjector with you to all appointments. Hold on your allergy injections until feeling better.  2. Pruritus-resolved - Continue a twice a day moisturizing routine - Continue Clarinex  once a day as needed for itch (as listed above). - Continue with triamcinolone  cream to use as needed (avoid the face).  Discuss with surgical team regarding upcoming surgery.  3. Return in about 1 year (around 12/05/2024).   Other:none  Kenneth Endow, MD  [facility] 

## 2023-12-07 DIAGNOSIS — R634 Abnormal weight loss: Secondary | ICD-10-CM | POA: Diagnosis not present

## 2023-12-07 DIAGNOSIS — K2930 Chronic superficial gastritis without bleeding: Secondary | ICD-10-CM | POA: Diagnosis not present

## 2023-12-07 DIAGNOSIS — K5900 Constipation, unspecified: Secondary | ICD-10-CM | POA: Diagnosis not present

## 2023-12-07 DIAGNOSIS — J019 Acute sinusitis, unspecified: Secondary | ICD-10-CM | POA: Diagnosis not present

## 2023-12-07 DIAGNOSIS — R110 Nausea: Secondary | ICD-10-CM | POA: Diagnosis not present

## 2023-12-13 ENCOUNTER — Ambulatory Visit (INDEPENDENT_AMBULATORY_CARE_PROVIDER_SITE_OTHER)

## 2023-12-13 DIAGNOSIS — J309 Allergic rhinitis, unspecified: Secondary | ICD-10-CM

## 2023-12-16 DIAGNOSIS — Z7982 Long term (current) use of aspirin: Secondary | ICD-10-CM | POA: Diagnosis not present

## 2023-12-16 DIAGNOSIS — K219 Gastro-esophageal reflux disease without esophagitis: Secondary | ICD-10-CM | POA: Diagnosis not present

## 2023-12-16 DIAGNOSIS — I10 Essential (primary) hypertension: Secondary | ICD-10-CM | POA: Diagnosis not present

## 2023-12-16 DIAGNOSIS — Z888 Allergy status to other drugs, medicaments and biological substances status: Secondary | ICD-10-CM | POA: Diagnosis not present

## 2023-12-16 DIAGNOSIS — Z9079 Acquired absence of other genital organ(s): Secondary | ICD-10-CM | POA: Diagnosis not present

## 2023-12-16 DIAGNOSIS — H903 Sensorineural hearing loss, bilateral: Secondary | ICD-10-CM | POA: Diagnosis not present

## 2023-12-16 DIAGNOSIS — Z87891 Personal history of nicotine dependence: Secondary | ICD-10-CM | POA: Diagnosis not present

## 2023-12-16 DIAGNOSIS — Z79899 Other long term (current) drug therapy: Secondary | ICD-10-CM | POA: Diagnosis not present

## 2023-12-21 NOTE — Telephone Encounter (Signed)
 Patient said nausea has improved and some pain on the left side. Patient asked about the steri strips on the back of the ear and if they need to be removed, I told him if he showers sometimes they can loosen up but if they haven't come off in 14 days he can remove them. He understood.

## 2023-12-23 ENCOUNTER — Other Ambulatory Visit: Payer: Self-pay | Admitting: Internal Medicine

## 2023-12-24 ENCOUNTER — Ambulatory Visit: Admitting: Allergy

## 2023-12-24 ENCOUNTER — Encounter: Payer: Self-pay | Admitting: Allergy

## 2023-12-24 ENCOUNTER — Other Ambulatory Visit: Payer: Self-pay

## 2023-12-24 VITALS — BP 130/82 | HR 70 | Temp 98.3°F | Resp 19 | Ht 70.0 in | Wt 178.2 lb

## 2023-12-24 DIAGNOSIS — J019 Acute sinusitis, unspecified: Secondary | ICD-10-CM

## 2023-12-24 DIAGNOSIS — Z9889 Other specified postprocedural states: Secondary | ICD-10-CM | POA: Diagnosis not present

## 2023-12-24 DIAGNOSIS — K6389 Other specified diseases of intestine: Secondary | ICD-10-CM | POA: Diagnosis not present

## 2023-12-24 DIAGNOSIS — B9689 Other specified bacterial agents as the cause of diseases classified elsewhere: Secondary | ICD-10-CM

## 2023-12-24 NOTE — Progress Notes (Signed)
 Follow-up Note  RE: Kenneth Daniel MRN: [id number] DOB: [birth year] Date of Office Visit: 12/24/2023   History of present illness: Kenneth Daniel is a 78 y.o. male presenting today for acute visit for sinus issues.  He was last seen in the office on 12/06/2023 by Dr. Marinda for acute sinusitis.  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 He was advised to monitor this for five to six days post-surgery and to watch for fever, which he has not reported.  Prior to the surgery, he completed a seven-day course of Augmentin  for a sinus infection, finishing four to five days before the procedure. No epistaxis or hemoptysis at that time.  After the surgery went noting some blood in his sputum that he would spit out he notified the ENT who thought he may have had a continued sinus infection and prescribed him an extension of the Augmentin  for 10 days.  However he states he still noticed some blood-tinged drainage and he reports the ENT advised that he speak with his allergist regarding this.  He presents today to discuss this. When using his nasal sprays fluticasone  and azelastine  he has been noticing that the azelastine  nasal spray on the left side, where the surgery was performed on the left ear, causes burning sensation. He is currently using azelastine  nasal spray twice daily and fluticasone  once daily. Fluticasone  does not cause discomfort. He also uses Clarinex  twice daily. His nose feels dry, and he has been using a gel for moisture. He is scheduled to see the surgeon in [place] on Wednesday for further evaluation and to activate the implant.  Review of systems: 10pt ROS negative unless noted above in HPI  Past medical/social/surgical/family history have been reviewed and are unchanged unless specifically indicated below.  No changes  Medication List: Current Outpatient Medications  Medication Sig  Dispense Refill   acetaminophen  (TYLENOL ) 500 MG tablet Take 1,000 mg by mouth every 8 (eight) hours as needed for moderate pain (pain score 4-6).     ALPRAZolam  (XANAX ) 0.25 MG tablet Take 0.25 mg by mouth daily as needed.     amLODipine  (NORVASC ) 10 MG tablet Take 10 mg by mouth every evening.     amoxicillin -clavulanate (AUGMENTIN ) 875-125 MG tablet Take 1 tablet by mouth 2 (two) times daily.     ascorbic acid  (VITAMIN C) 500 MG tablet Take 500 mg by mouth.     aspirin  81 MG EC tablet Take 81 mg by mouth at bedtime.     atorvastatin  (LIPITOR) 40 MG tablet Take 40 mg by mouth at bedtime.     Azelastine  HCl 137 MCG/SPRAY SOLN PLACE 2 SPRAYS INTO BOTH NOSTRILS 2 (TWO) TIMES DAILY 30 mL 5   azithromycin (ZITHROMAX) 250 MG tablet Take by mouth as directed.     desloratadine  (CLARINEX ) 5 MG tablet Take 1 tablet (5 mg total) by mouth daily as needed. 180 tablet 2   EPINEPHrine  0.3 mg/0.3 mL IJ SOAJ injection Inject 0.3 mg into the muscle as needed. Bring to your allergy injection appointments. 2 each 1   famotidine  (PEPCID ) 40 MG tablet Take 40 mg by mouth daily.     FARXIGA 10 MG TABS tablet      fluticasone  (FLONASE ) 50 MCG/ACT nasal spray Place 2 sprays into both nostrils daily. 48 mL 11   hydrOXYzine  (VISTARIL ) 25 MG capsule Take 1 capsule (25 mg total) by mouth every 8 (eight) hours  as needed for anxiety. 30 capsule 0   Magnesium  250 MG TABS Take 250 mg by mouth at bedtime.     Melatonin 5 MG CAPS Take 10 mg by mouth at bedtime.     nebivolol  (BYSTOLIC ) 10 MG tablet TAKE 1 TABLET BY MOUTH EVERY DAY IN THE MORNING 90 tablet 3   omeprazole  (PRILOSEC) 40 MG capsule TAKE 1 CAPSULE BY MOUTH EVERY MORNING AND AT BEDTIME 180 capsule 3   ondansetron  (ZOFRAN -ODT) 4 MG disintegrating tablet 1 tablet on the tongue and allow to dissolve Orally every 8 hours; Duration: 30 days As needed     Polyethylene Glycol 3350  (MIRALAX  PO) Take by mouth daily.     sucralfate  (CARAFATE ) 1 GM/10ML suspension SMARTSIG:2  Teaspoon By Mouth 4 Times Daily     valsartan -hydrochlorothiazide  (DIOVAN -HCT) 320-12.5 MG tablet Take 1 tablet by mouth daily.     Vitamin D , Ergocalciferol , (DRISDOL) 1.25 MG (50000 UNIT) CAPS capsule Take 50,000 Units by mouth once a week.     Wheat Dextrin (BENEFIBER PO) Take by mouth 3 (three) times daily.     XTANDI  40 MG tablet Take 160 mg by mouth every evening.     No current facility-administered medications for this visit.     Known medication allergies: Allergies  Allergen Reactions   Clonidine Other (See Comments)    Makes him feel fatigued.     Physical examination: Blood pressure 130/82, pulse 70, temperature 98.3 F (36.8 C), resp. rate 19, height 5' 10 (1.778 m), weight 178 lb 3.2 oz (80.8 kg), SpO2 96%.  General: Alert, interactive, in no acute distress. HEENT: PERRLA, left posterior ear crease bandaged, outer canal normal in appearance, turbinates non-edematous without discharge, post-pharynx non erythematous. Neck: Supple without lymphadenopathy. Lungs: Clear to auscultation without wheezing, rhonchi or rales. {no increased work of breathing. CV: Normal S1, S2 without murmurs. Abdomen: Nondistended, nontender. Skin: Warm and dry, without lesions or rashes. Extremities:  No clubbing, cyanosis or edema. Neuro:   Grossly intact.  Diagnostics/Labs: None today  Assessment and plan:   Acute sinusitis with incomplete resolution  Status post ear surgery - Hold Fluticasone  and Azelastine  while on antibiotic.  Can resume use after completion of antibiotic and your visit with your surgeon next week - Complete Augmentin  extension course as directed - For now use nasal saline spray 1-2 times a day to moisturize the nose while completing antibiotic - Take Clarinex  5 mg 2 times daily. - Hold on your allergy injections until feeling better.   Return in about 4-6 months or sooner if needed.   I appreciate the opportunity to take part in [name]'s care. Please do not  hesitate to contact me with questions.  Sincerely,   Danita Brain, MD Allergy/Immunology [facility]

## 2023-12-24 NOTE — Patient Instructions (Addendum)
 Acute sinusitis with incomplete resolution  Status post ear surgery - Hold Fluticasone  and Azelastine  while on antibiotic.  Can resume use after completion of antibiotic and your visit with your surgeon next week - Complete Augmentin  extension course as directed - For now use nasal saline spray 1-2 times a day to moisturize the nose while completing antibiotic - Take Clarinex  5 mg 2 times daily. - Hold on your allergy injections until feeling better.   Return in about 4-6 months or sooner if needed.

## 2023-12-28 ENCOUNTER — Ambulatory Visit (INDEPENDENT_AMBULATORY_CARE_PROVIDER_SITE_OTHER): Admitting: Licensed Clinical Social Worker

## 2023-12-28 DIAGNOSIS — F4323 Adjustment disorder with mixed anxiety and depressed mood: Secondary | ICD-10-CM

## 2023-12-28 NOTE — Progress Notes (Signed)
 [facility] Counselor/Therapist Progress Note  Patient ID: COAL NEARHOOD, MRN: [id number]    Date: 12/28/23  Time Spent: 1115  am - 1148 am : 33 Minutes  Treatment Type: Individual Therapy.  Reported Symptoms: Patient reports anxiety and depression due to a recent illness and his fear of something bad happening. Patient reports having stomach issues that previously left him anxious when traveling which is something he enjoys.   Mental Status Exam: Appearance:  Casual     Behavior: Appropriate  Motor: Normal  Speech/Language:  Clear and Coherent  Affect: Appropriate  Mood: normal  Thought process: normal  Thought content:   WNL  Sensory/Perceptual disturbances:   WNL  Orientation: oriented to person, place, time/date, situation, day of week, month of year, and year  Attention: Good  Concentration: Good  Memory: WNL  Fund of knowledge:  Good  Insight:   Good  Judgment:  Good  Impulse Control: Good    Risk Assessment: Danger to Self:  No Self-injurious Behavior: No Danger to Others: No Duty to Warn:no Physical Aggression / Violence:No  Access to Firearms a concern: No  Gang Involvement:No    Subjective:  [name] participated from office, located at [facility] with Clinician present. Allin consented to treatment.    Hosie presented for his session reporting that he has a lot going on. He reports that he had his hearing surgery and it went well. He states that he goes tomorrow to see if it will work. He reports that he was told that it may not be an immediate result. He reports that he still remains anxious about it. He also reports that his friend of over 40 years has been placed on hospice due to cancer diagnosis. Patient was told he could not fly for at least 3 months after the surgery. He states he is concerned that something will happen to his friend and he will not be able to attend the funeral or see him before.   Clinician actively  listened and provided support and verbal feedback. Clinician encouraged patient to make calls to his friend and engage in conversation with him now. To also let his friend know about his current situation, and to remind himself that he has been a good friend all these years. Patient agreed and stated that it made him feel better to have this option. Clinician processed that the years they were friends will be what ultimately matters in the end. Clinician also encouraged patient to remain positive about the effects of the surgery on his hearing. We discussed that although he doesn't have immediate results doesn't mean that it won't happen.   Brexton was in a much better place when the session was over than when it began. He was fully engaged in session discussion and was motivated for treatment. Patient is to use CBT, mindfulness and coping skills to help manage decrease symptoms associated with their diagnosis. Treatment planning to be reviewed by 12/27/2024   Interventions: Cognitive Behavioral Therapy, Dialectical Behavioral Therapy, Assertiveness/Communication, Motivational Interviewing, and Solution-Oriented/Positive Psychology  Diagnosis: Adjustment Disorder with mixed anxiety and depression    [name] Junk MSW, LCSW/DATE 12/28/2023

## 2024-01-06 DIAGNOSIS — H903 Sensorineural hearing loss, bilateral: Secondary | ICD-10-CM | POA: Diagnosis not present

## 2024-01-10 ENCOUNTER — Ambulatory Visit (INDEPENDENT_AMBULATORY_CARE_PROVIDER_SITE_OTHER)

## 2024-01-10 DIAGNOSIS — J309 Allergic rhinitis, unspecified: Secondary | ICD-10-CM

## 2024-01-13 DIAGNOSIS — C61 Malignant neoplasm of prostate: Secondary | ICD-10-CM | POA: Diagnosis not present

## 2024-01-14 ENCOUNTER — Encounter (HOSPITAL_COMMUNITY): Payer: Self-pay

## 2024-01-14 ENCOUNTER — Ambulatory Visit (HOSPITAL_COMMUNITY)
Admission: EM | Admit: 2024-01-14 | Discharge: 2024-01-14 | Disposition: A | Discharge status: Home / Self Care | Attending: Family Medicine | Admitting: Family Medicine

## 2024-01-14 DIAGNOSIS — R42 Dizziness and giddiness: Secondary | ICD-10-CM | POA: Diagnosis not present

## 2024-01-14 MED ORDER — MECLIZINE HCL 25 MG PO TABS: 25.0000 mg | ORAL_TABLET | Freq: Three times a day (TID) | ORAL | 0 refills | Status: AC | PRN

## 2024-01-14 NOTE — Discharge Instructions (Addendum)
 Take meclizine  25 mg--1 tablet every 8 hours as needed for vertigo  Please follow-up with your ENT specialist.

## 2024-01-14 NOTE — ED Triage Notes (Signed)
 Patient here today with c/o dizziness and nausea since last night. Patient is having trouble with his balance. Patient recently got hearing aid implants on 12/16/2023. Patient reached out to [facility] and he was told that his symptoms were a side effect to the hearing aids. Patient reached out to the doctor that put them in but they were closed. Denies chest pain.

## 2024-01-14 NOTE — ED Provider Notes (Signed)
 [facility]    CSN: [id number] Arrival date & time: 01/14/24  1232      History   Chief Complaint Chief Complaint  Patient presents with   Dizziness    HPI Kenneth Daniel is a 78 y.o. male.    Dizziness Here for vertigo that began last evening.  It mainly hits him when he looks to the side or rolls over in the bed.  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  No fever and no congestion  No headache and no ear pain He is allergic to clonidine  He tried calling his ENT specialist in [place] and they apparently are not in the office again until September 16.    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, right    per last duplex in epic 06-04-2020  right ICA 16-49% and right ECA >50%   Chronic constipation    GERD (gastroesophageal reflux disease)    History of acute pyelonephritis 09/2020   w/ admission in epic due to severe sepsis   History of COVID-19 11/18/2020   positive result in epic;   per pt mild to moderate symptoms that resolved   History of external beam radiation therapy    completed in 2015 for recurrent prostate cancer (done in [facility])   History of prostate cancer    (current urologist-- dr rosalind, and current treatment lupron  injeciton q 6months) ;   per pt in [place], [place] 2004  s/p radical prostatectomy;   recurrence 2015 completed radiation   HLD (hyperlipidemia)    Hypertension    followed by pcp and cardiology---   (05-29-2020  nuclear stress test in epic , low risk no ischemia nuclear ef 63%)   Inguinal hernia, right    Non-rheumatic aortic regurgitation    mild to moderate AR without stenosis per last echo in epic 02-27-2021   Perennial allergic rhinitis    followed by dr gollager ([facility])   Wears dentures    upper   Wears glasses    Wears hearing aid in both ears     Patient  Active Problem List   Diagnosis Date Noted   Adjustment disorder with mixed anxiety and depressed mood 11/30/2023   TIA (transient ischemic attack) 08/08/2023   Nonrheumatic aortic valve insufficiency    Perennial allergic rhinitis 12/12/2020   Pruritus 12/12/2020   Seasonal allergic conjunctivitis 12/12/2020   Acute pyelonephritis 09/22/2020   Pyelonephritis 09/22/2020   Myelopathy (HCC) 11/09/2019    Past Surgical History:  Procedure Laterality Date   ANTERIOR CERVICAL DECOMP/DISCECTOMY FUSION N/A 11/09/2019   Procedure: ANTERIOR CERVICAL DECOMPRESSION FUSION CERVICAL 3-4 WITH INSTRUMENTATION AND ALLOGRAFT;  Surgeon: Beuford Anes, MD;  Location: [facility];  Service: Orthopedics;  Laterality: N/A;   CARDIAC CATHETERIZATION  2002   in [place], [place];   per pt stress test with possible ischemia,  told arteries normal   COLONOSCOPY  2020   CYST EXCISION  2005   per pt from back , was benign   INGUINAL HERNIA REPAIR Right 06/04/2021   Procedure: OPEN RIGHT INGUINAL HERNIA REPAIR;  Surgeon: Signe Mitzie LABOR, MD;  Location: [facility];  Service: General;  Laterality: Right;   PROSTATECTOMY  2004   in [place], [place]   UPPER GI ENDOSCOPY  2021       Home Medications  Prior to Admission medications   Medication Sig Start Date End Date Taking? Authorizing Provider  meclizine  (ANTIVERT ) 25 MG tablet Take 1 tablet (25 mg total) by mouth 3 (three) times daily as needed for dizziness (vertigo). 01/14/24  Yes Vonna Sharlet POUR, MD  acetaminophen  (TYLENOL ) 500 MG tablet Take 1,000 mg by mouth every 8 (eight) hours as needed for moderate pain (pain score 4-6).    [provider]  ALPRAZolam  (XANAX ) 0.25 MG tablet Take 0.25 mg by mouth daily as needed. 12/26/21   [provider]  amLODipine  (NORVASC ) 10 MG tablet Take 10 mg by mouth every evening.    [provider]  ascorbic acid  (VITAMIN C) 500 MG tablet Take 500 mg by mouth.    [provider]   aspirin  81 MG EC tablet Take 81 mg by mouth at bedtime.    [provider]  atorvastatin  (LIPITOR) 40 MG tablet Take 40 mg by mouth at bedtime.    [provider]  Azelastine  HCl 137 MCG/SPRAY SOLN PLACE 2 SPRAYS INTO BOTH NOSTRILS 2 (TWO) TIMES DAILY 12/23/23   Marinda Rocky SAILOR, MD  desloratadine  (CLARINEX ) 5 MG tablet Take 1 tablet (5 mg total) by mouth daily as needed. 12/06/23   Marinda Rocky SAILOR, MD  EPINEPHrine  0.3 mg/0.3 mL IJ SOAJ injection Inject 0.3 mg into the muscle as needed. Bring to your allergy injection appointments. 12/06/23   Marinda Rocky SAILOR, MD  famotidine  (PEPCID ) 40 MG tablet Take 40 mg by mouth daily.    [provider]  FARXIGA 10 MG TABS tablet  07/31/23   [provider]  fluticasone  (FLONASE ) 50 MCG/ACT nasal spray Place 2 sprays into both nostrils daily. 12/06/23   Marinda Rocky SAILOR, MD  hydrOXYzine  (VISTARIL ) 25 MG capsule Take 1 capsule (25 mg total) by mouth every 8 (eight) hours as needed for anxiety. 12/18/22   Vicky Charleston, PA-C  Magnesium  250 MG TABS Take 250 mg by mouth at bedtime.    [provider]  Melatonin 5 MG CAPS Take 10 mg by mouth at bedtime.    [provider]  nebivolol  (BYSTOLIC ) 10 MG tablet TAKE 1 TABLET BY MOUTH EVERY DAY IN THE MORNING 04/26/23   Tolia, Sunit, DO  omeprazole  (PRILOSEC) 40 MG capsule TAKE 1 CAPSULE BY MOUTH EVERY MORNING AND AT BEDTIME 11/03/23   Sherlynn Madden, MD  ondansetron  (ZOFRAN -ODT) 4 MG disintegrating tablet 1 tablet on the tongue and allow to dissolve Orally every 8 hours; Duration: 30 days As needed 12/21/23   [provider]  Polyethylene Glycol 3350  (MIRALAX  PO) Take by mouth daily.    [provider]  sucralfate  (CARAFATE ) 1 GM/10ML suspension SMARTSIG:2 Teaspoon By Mouth 4 Times Daily 11/10/22   [provider]  valsartan -hydrochlorothiazide  (DIOVAN -HCT) 320-12.5 MG tablet Take 1 tablet by mouth daily. 02/21/20   [provider]  Vitamin  D, Ergocalciferol , (DRISDOL) 1.25 MG (50000 UNIT) CAPS capsule Take 50,000 Units by mouth once a week.    [provider]  Wheat Dextrin (BENEFIBER PO) Take by mouth 3 (three) times daily.    [provider]  XTANDI  40 MG tablet Take 160 mg by mouth every evening. 10/19/22   [provider]    Family History Family History  Problem Relation Age of Onset   Pancreatic cancer Mother    Prostate cancer Father     Social History Social History   Tobacco Use   Smoking status: Former    Current packs/day: 0.00  Types: Cigarettes    Start date: 43    Quit date: 1973    Years since quitting: 52.7   Smokeless tobacco: Never  Vaping Use   Vaping status: Never Used  Substance Use Topics   Alcohol use: Never   Drug use: Never     Allergies   Clonidine   Review of Systems Review of Systems  Neurological:  Positive for dizziness.     Physical Exam Triage Vital Signs ED Triage Vitals  Encounter Vitals Group     BP 01/14/24 1318 (!) 154/78     Girls Systolic BP Percentile --      Girls Diastolic BP Percentile --      Boys Systolic BP Percentile --      Boys Diastolic BP Percentile --      Pulse Rate 01/14/24 1318 70     Resp 01/14/24 1318 16     Temp 01/14/24 1318 97.7 F (36.5 C)     Temp Source 01/14/24 1318 Oral     SpO2 01/14/24 1318 95 %     Weight --      Height --      Head Circumference --      Peak Flow --      Pain Score 01/14/24 1319 0     Pain Loc --      Pain Education --      Exclude from Growth Chart --    No data found.  Updated Vital Signs BP (!) 154/78 (BP Location: Left Arm)   Pulse 70   Temp 97.7 F (36.5 C) (Oral)   Resp 16   SpO2 95%   Visual Acuity Right Eye Distance:   Left Eye Distance:   Bilateral Distance:    Right Eye Near:   Left Eye Near:    Bilateral Near:     Physical Exam Vitals reviewed.  Constitutional:      General: He is not in acute distress.    Appearance: He is not  toxic-appearing.  HENT:     Right Ear: Tympanic membrane and ear canal normal.     Left Ear: Tympanic membrane and ear canal normal.     Mouth/Throat:     Mouth: Mucous membranes are moist.  Eyes:     Conjunctiva/sclera: Conjunctivae normal.     Pupils: Pupils are equal, round, and reactive to light.     Comments: There is nystagmus on bilateral gaze, moreso to the right  Cardiovascular:     Rate and Rhythm: Normal rate and regular rhythm.  Pulmonary:     Effort: Pulmonary effort is normal.     Breath sounds: Normal breath sounds.  Musculoskeletal:     Cervical back: Neck supple.  Lymphadenopathy:     Cervical: No cervical adenopathy.  Skin:    Coloration: Skin is not jaundiced or pale.  Neurological:     General: No focal deficit present.     Mental Status: He is alert and oriented to person, place, and time.  Psychiatric:        Behavior: Behavior normal.      UC Treatments / Results  Labs (all labs ordered are listed, but only abnormal results are displayed) Labs Reviewed - No data to display  EKG   Radiology No results found.  Procedures Procedures (including critical care time)  Medications Ordered in UC Medications - No data to display  Initial Impression / Assessment and Plan / UC Course  I have reviewed the triage vital  signs and the nursing notes.  Pertinent labs & imaging results that were available during my care of the patient were reviewed by me and considered in my medical decision making (see chart for details).     This appears to be benign positional vertigo, uncertain if actually related to his recent implant or not.  Meclizine  is sent in for the symptoms and he is given Epley maneuver.  I still have asked him to follow-up with his ENT specialist Final Clinical Impressions(s) / UC Diagnoses   Final diagnoses:  Vertigo     Discharge Instructions      Take meclizine  25 mg--1 tablet every 8 hours as needed for vertigo  Please  follow-up with your ENT specialist.         ED Prescriptions     Medication Sig Dispense Auth. Provider   meclizine  (ANTIVERT ) 25 MG tablet Take 1 tablet (25 mg total) by mouth 3 (three) times daily as needed for dizziness (vertigo). 30 tablet Kenden Brandt K, MD      PDMP not reviewed this encounter.   Vonna Sharlet POUR, MD 01/14/24 [phone]

## 2024-01-17 ENCOUNTER — Ambulatory Visit (INDEPENDENT_AMBULATORY_CARE_PROVIDER_SITE_OTHER): Admitting: Licensed Clinical Social Worker

## 2024-01-17 DIAGNOSIS — F4323 Adjustment disorder with mixed anxiety and depressed mood: Secondary | ICD-10-CM | POA: Diagnosis not present

## 2024-01-17 NOTE — Progress Notes (Signed)
 [facility] Counselor/Therapist Progress Note  Patient ID: Kenneth Daniel, MRN: [id number]    Date: 01/17/24  Time Spent: 0213  pm - 0300 pm : 42 Minutes  Treatment Type: Individual Therapy.  Reported Symptoms: Patient reports anxiety and depression due to a recent illness and his fear of something bad happening. Patient reports having stomach issues that previously left him anxious when traveling which is something he enjoys.   Mental Status Exam: Appearance:  Casual     Behavior: Appropriate  Motor: Normal  Speech/Language:  Clear and Coherent  Affect: Appropriate  Mood: normal  Thought process: normal  Thought content:   WNL  Sensory/Perceptual disturbances:   WNL  Orientation: oriented to person, place, time/date, situation, day of week, month of year, and year  Attention: Good  Concentration: Good  Memory: WNL  Fund of knowledge:  Good  Insight:   Good  Judgment:  Good  Impulse Control: Good    Risk Assessment: Danger to Self:  No Self-injurious Behavior: No Danger to Others: No Duty to Warn:no Physical Aggression / Violence:No  Access to Firearms a concern: No  Gang Involvement:No    Subjective:  Kenneth Daniel Kenneth Daniel participated from office, located at [facility] with Clinician present. Kenneth Daniel consented to treatment.   Kenneth Daniel presented for his session late. Patient stated he was seeing family members off to the airport. Patient reports that he had his ear piece placed from his ear surgery. He reports that he has to take the hearing out for 30 minutes each day and allow the device to connect to the receptors. Patient reports that he has began to experience vertigo and upset stomach when he has the vertigo. Patient reports that he is concerned about the stomach issues. Patient reports when he spoke with the ear doctor the doctor was dismissive of his concerns.  Clinician provided support and encouragement via verbal feedback and active listening.  Clinician processed with patient his concerns. Clinician processed with patient his anxiety about his stomach issues. Clinician discussed with patient his previous stomach issues and the negative impact that had on his life and mental well being. Clinician identified that it could be possible that due to the trauma of the  previous event, that when he has any stomach problems his mind returns to that time. Clinician encouraged patient to return to utilizing re framing to assist in him not having the anxiety over his stomach.  Kenneth Daniel was active and engaged in session. Kenneth Daniel is open to feedback and willing to utilize coping skills to assist in positive thoughts. Kenneth Daniel is motivated for treatment as evidenced in his willingness to be open to feedback and engage in discussion and problem solving. Kenneth Daniel is to use CBT, mindfulness and coping skills to help manage decrease symptoms associated with their diagnosis. Treatment plan to be reviewed by 12/27/2024.   Interventions: Cognitive Behavioral Therapy, Dialectical Behavioral Therapy, Mindfulness Meditation, Motivational Interviewing, Solution-Oriented/Positive Psychology, and Insight-Oriented  Diagnosis: Adjustment Disorder with mixed anxiety and depressed mood   Kenneth Daniel Junk MSW, LCSW/DATE 01/17/2024

## 2024-01-18 ENCOUNTER — Ambulatory Visit (INDEPENDENT_AMBULATORY_CARE_PROVIDER_SITE_OTHER): Admitting: *Deleted

## 2024-01-18 DIAGNOSIS — J309 Allergic rhinitis, unspecified: Secondary | ICD-10-CM

## 2024-01-19 ENCOUNTER — Ambulatory Visit: Admitting: Sports Medicine

## 2024-01-20 DIAGNOSIS — N281 Cyst of kidney, acquired: Secondary | ICD-10-CM | POA: Diagnosis not present

## 2024-01-20 DIAGNOSIS — C7951 Secondary malignant neoplasm of bone: Secondary | ICD-10-CM | POA: Diagnosis not present

## 2024-01-20 DIAGNOSIS — C61 Malignant neoplasm of prostate: Secondary | ICD-10-CM | POA: Diagnosis not present

## 2024-01-20 NOTE — Progress Notes (Signed)
 Patient left a message at 11:18am that he has an upset stomach after doing his 30 minute practice for the cochlear.  I previously advised the patient to contact his GI doctor since he has a history of GI issues.  I returned the patients call at 11:28am and asked the patient to return my call so we can schedule an appointment with Dr. Cherilyn.   Patient called back and stated that he saw his urologist today and he is reducing his Xtandi  from QID to BID because it can cause a pulling sensation on the stomach, PSA is currently normal.  Patient is seeing his PCP on 9/22 and his GI on 9/30.  Patient declined an appointment with Dr. Cherilyn and wants to wait to see if reducing the Xtandi  will help with his symptoms. I called [facility] and let them know about the patient's symptoms,  patient had called them also.  Patient is seeing [facility] on 9/25 for the activation follow up.

## 2024-01-24 DIAGNOSIS — K2101 Gastro-esophageal reflux disease with esophagitis, with bleeding: Secondary | ICD-10-CM | POA: Diagnosis not present

## 2024-01-24 DIAGNOSIS — L210 Seborrhea capitis: Secondary | ICD-10-CM | POA: Diagnosis not present

## 2024-01-24 DIAGNOSIS — I10 Essential (primary) hypertension: Secondary | ICD-10-CM | POA: Diagnosis not present

## 2024-01-24 DIAGNOSIS — H814 Vertigo of central origin: Secondary | ICD-10-CM | POA: Diagnosis not present

## 2024-01-24 DIAGNOSIS — Z9621 Cochlear implant status: Secondary | ICD-10-CM | POA: Diagnosis not present

## 2024-01-25 ENCOUNTER — Ambulatory Visit (INDEPENDENT_AMBULATORY_CARE_PROVIDER_SITE_OTHER)

## 2024-01-25 ENCOUNTER — Ambulatory Visit: Admitting: Sports Medicine

## 2024-01-25 ENCOUNTER — Encounter: Payer: Self-pay | Admitting: Sports Medicine

## 2024-01-25 VITALS — BP 128/86 | HR 16 | Temp 98.0°F | Resp 13 | Ht 70.0 in | Wt 175.6 lb

## 2024-01-25 DIAGNOSIS — I10 Essential (primary) hypertension: Secondary | ICD-10-CM | POA: Diagnosis not present

## 2024-01-25 DIAGNOSIS — Z8673 Personal history of transient ischemic attack (TIA), and cerebral infarction without residual deficits: Secondary | ICD-10-CM

## 2024-01-25 DIAGNOSIS — R42 Dizziness and giddiness: Secondary | ICD-10-CM | POA: Diagnosis not present

## 2024-01-25 DIAGNOSIS — F411 Generalized anxiety disorder: Secondary | ICD-10-CM | POA: Diagnosis not present

## 2024-01-25 DIAGNOSIS — J309 Allergic rhinitis, unspecified: Secondary | ICD-10-CM

## 2024-01-25 DIAGNOSIS — K219 Gastro-esophageal reflux disease without esophagitis: Secondary | ICD-10-CM | POA: Diagnosis not present

## 2024-01-25 MED ORDER — VITAMIN D (CHOLECALCIFEROL) 25 MCG (1000 UT) PO CAPS: 1.0000 | ORAL_CAPSULE | Freq: Every day | ORAL | 3 refills | Status: AC

## 2024-01-25 MED ORDER — COVID-19 MRNA VACCINE (PFIZER) 30 MCG/0.3ML IM SUSP: 0.3000 mL | Freq: Once | INTRAMUSCULAR | 0 refills | Status: AC

## 2024-01-25 NOTE — Progress Notes (Signed)
 Careteam: Patient Care Team: Benjamine Aland, MD as PCP - General (Family Medicine) Sherlynn Madden, MD as PCP - Internal Medicine (Internal Medicine) Michele Richardson, DO as PCP - Cardiology (Cardiology)  PLACE OF SERVICE:  [facility]  Advanced Directive information    Allergies  Allergen Reactions   Clonidine Other (See Comments)    Makes him feel fatigued.    No chief complaint on file.    Discussed the use of AI scribe software for clinical note transcription with the patient, who gave verbal consent to proceed.  History of Present Illness Kenneth Daniel is a 78 year old male who presents for follow up  He has been experiencing vertigo since having a left ear implant placed in August. The vertigo was initially severe but has shown some improvement over time.   No double vision, trouble swallowing, chest pain, or palpitations. He notes a slight imbalance, especially when getting up too quickly, which has caused him to slow down his walking routine.    GERD- follows with GI  denies abdominal pain, bloody or dark stools reports good appetite weight stable.  h/o Prostate cancer follows with urology  on Xtandi    He experiences anxiety and has a counselor for support. He has a prescription for Xanax , which he uses as needed.     Review of Systems:  Review of Systems  Constitutional:  Negative for chills and fever.  HENT:  Negative for sore throat.   Respiratory:  Negative for cough, sputum production and shortness of breath.   Cardiovascular:  Negative for chest pain, palpitations and leg swelling.  Gastrointestinal:  Positive for nausea. Negative for abdominal pain and heartburn.  Genitourinary:  Negative for dysuria and hematuria.  Musculoskeletal:  Negative for falls and myalgias.  Neurological:  Negative for dizziness, sensory change and focal weakness.       Vertigo   Negative unless indicated in HPI.   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, right    per last duplex in epic 06-04-2020  right ICA 16-49% and right ECA >50%   Chronic constipation    GERD (gastroesophageal reflux disease)    History of acute pyelonephritis 09/2020   w/ admission in epic due to severe sepsis   History of COVID-19 11/18/2020   positive result in epic;   per pt mild to moderate symptoms that resolved   History of external beam radiation therapy    completed in 2015 for recurrent prostate cancer (done in [facility])   History of prostate cancer    (current urologist-- dr rosalind, and current treatment lupron  injeciton q 6months) ;   per pt in [place], [place] 2004  s/p radical prostatectomy;   recurrence 2015 completed radiation   HLD (hyperlipidemia)    Hypertension    followed by pcp and cardiology---   (05-29-2020  nuclear stress test in epic , low risk no ischemia nuclear ef 63%)   Inguinal hernia, right    Non-rheumatic aortic regurgitation    mild to moderate AR without stenosis per last echo in epic 02-27-2021   Perennial allergic rhinitis    followed by dr gollager ([facility])   Wears dentures    upper   Wears glasses    Wears hearing aid in both ears    Past Surgical History:  Procedure Laterality Date   ANTERIOR CERVICAL DECOMP/DISCECTOMY FUSION N/A 11/09/2019   Procedure: ANTERIOR CERVICAL DECOMPRESSION FUSION CERVICAL  3-4 WITH INSTRUMENTATION AND ALLOGRAFT;  Surgeon: Beuford Anes, MD;  Location: [facility] OR;  Service: Orthopedics;  Laterality: N/A;   CARDIAC CATHETERIZATION  2002   in [place], [place];   per pt stress test with possible ischemia,  told arteries normal   COLONOSCOPY  2020   CYST EXCISION  2005   per pt from back , was benign   INGUINAL HERNIA REPAIR Right 06/04/2021   Procedure: OPEN RIGHT INGUINAL HERNIA REPAIR;  Surgeon: Signe Mitzie LABOR, MD;  Location: [facility];  Service: General;  Laterality: Right;   PROSTATECTOMY  2004   in [place], [place]   UPPER  GI ENDOSCOPY  2021   Social History:   reports that he quit smoking about 52 years ago. His smoking use included cigarettes. He started smoking about 56 years ago. He has never used smokeless tobacco. He reports that he does not drink alcohol and does not use drugs.  Family History  Problem Relation Age of Onset   Pancreatic cancer Mother    Prostate cancer Father     Medications: Patient's Medications  New Prescriptions   No medications on file  Previous Medications   ACETAMINOPHEN  (TYLENOL ) 500 MG TABLET    Take 1,000 mg by mouth every 8 (eight) hours as needed for moderate pain (pain score 4-6).   ALPRAZOLAM  (XANAX ) 0.25 MG TABLET    Take 0.25 mg by mouth daily as needed.   AMLODIPINE  (NORVASC ) 10 MG TABLET    Take 10 mg by mouth every evening.   ASCORBIC ACID  (VITAMIN C) 500 MG TABLET    Take 500 mg by mouth.   ASPIRIN  81 MG EC TABLET    Take 81 mg by mouth at bedtime.   ATORVASTATIN  (LIPITOR) 40 MG TABLET    Take 40 mg by mouth at bedtime.   AZELASTINE  HCL 137 MCG/SPRAY SOLN    PLACE 2 SPRAYS INTO BOTH NOSTRILS 2 (TWO) TIMES DAILY   B COMPLEX VITAMINS (VITAMIN B COMPLEX PO)    Take 1 tablet by mouth as needed.   DESLORATADINE  (CLARINEX ) 5 MG TABLET    Take 1 tablet (5 mg total) by mouth daily as needed.   EPINEPHRINE  0.3 MG/0.3 ML IJ SOAJ INJECTION    Inject 0.3 mg into the muscle as needed. Bring to your allergy injection appointments.   FAMOTIDINE  (PEPCID ) 40 MG TABLET    Take 40 mg by mouth daily.   FARXIGA 10 MG TABS TABLET       FLUTICASONE  (FLONASE ) 50 MCG/ACT NASAL SPRAY    Place 2 sprays into both nostrils daily.   HYDROXYZINE  (VISTARIL ) 25 MG CAPSULE    Take 1 capsule (25 mg total) by mouth every 8 (eight) hours as needed for anxiety.   MAGNESIUM  250 MG TABS    Take 250 mg by mouth at bedtime.   MECLIZINE  (ANTIVERT ) 25 MG TABLET    Take 1 tablet (25 mg total) by mouth 3 (three) times daily as needed for dizziness (vertigo).   MELATONIN 5 MG CAPS    Take 10 mg by mouth at  bedtime.   NEBIVOLOL  (BYSTOLIC ) 10 MG TABLET    TAKE 1 TABLET BY MOUTH EVERY DAY IN THE MORNING   OMEPRAZOLE  (PRILOSEC) 40 MG CAPSULE    TAKE 1 CAPSULE BY MOUTH EVERY MORNING AND AT BEDTIME   ONDANSETRON  (ZOFRAN -ODT) 4 MG DISINTEGRATING TABLET    1 tablet on the tongue and allow to dissolve Orally every 8 hours; Duration: 30 days As needed  POLYETHYLENE GLYCOL 3350  (MIRALAX  PO)    Take by mouth daily.   SUCRALFATE  (CARAFATE ) 1 GM/10ML SUSPENSION    SMARTSIG:2 Teaspoon By Mouth 4 Times Daily   VALSARTAN -HYDROCHLOROTHIAZIDE  (DIOVAN -HCT) 320-12.5 MG TABLET    Take 1 tablet by mouth daily.   VITAMIN D , ERGOCALCIFEROL , (DRISDOL) 1.25 MG (50000 UNIT) CAPS CAPSULE    Take 50,000 Units by mouth once a week.   WHEAT DEXTRIN (BENEFIBER PO)    Take by mouth 3 (three) times daily.   XTANDI  40 MG TABLET    Take 160 mg by mouth every evening.  Modified Medications   No medications on file  Discontinued Medications   No medications on file    Physical Exam: Vitals:   01/25/24 1307  Weight: 175 lb 9.6 oz (79.7 kg)  Height: 5' 10 (1.778 m)   Body mass index is 25.2 kg/m. BP Readings from Last 3 Encounters:  01/14/24 (!) 154/78  12/24/23 130/82  12/06/23 138/76   Wt Readings from Last 3 Encounters:  01/25/24 175 lb 9.6 oz (79.7 kg)  12/24/23 178 lb 3.2 oz (80.8 kg)  12/06/23 174 lb 1.6 oz (79 kg)    Physical Exam Constitutional:      Appearance: Normal appearance.  HENT:     Head: Normocephalic and atraumatic.  Cardiovascular:     Rate and Rhythm: Normal rate and regular rhythm.     Pulses: Normal pulses.     Heart sounds: Normal heart sounds.  Pulmonary:     Effort: No respiratory distress.     Breath sounds: No stridor. No wheezing or rales.  Abdominal:     General: Bowel sounds are normal. There is no distension.     Palpations: Abdomen is soft.     Tenderness: There is no abdominal tenderness. There is no guarding.  Musculoskeletal:        General: No swelling.   Neurological:     Mental Status: He is alert. Mental status is at baseline.     Motor: No weakness.     Labs reviewed: Basic Metabolic Panel: Recent Labs    07/08/23 0906 08/08/23 1333  NA 141 137  K 4.0 3.9  CL 105 103  CO2 30 25  GLUCOSE 93 102*  BUN 18 17  CREATININE 0.97 0.97  CALCIUM  9.7 9.6   Liver Function Tests: Recent Labs    08/08/23 1333  AST 23  ALT 21  ALKPHOS 56  BILITOT 1.0  PROT 8.0  ALBUMIN 4.1   No results for input(s): LIPASE, AMYLASE in the last 8760 hours. No results for input(s): AMMONIA in the last 8760 hours. CBC: Recent Labs    07/08/23 0906 08/08/23 1333  WBC 2.9* 2.6*  NEUTROABS 1,505 1.7  HGB 13.0* 13.9  HCT 40.7 43.6  MCV 88.5 90.1  PLT 195 206   Lipid Panel: Recent Labs    07/08/23 0906 08/09/23 0451  CHOL 121 106  HDL 53 40*  LDLCALC 55 51  TRIG 45 76  CHOLHDL 2.3 2.7   TSH: No results for input(s): TSH in the last 8760 hours. A1C: Lab Results  Component Value Date   HGBA1C 6.1 (H) 08/09/2023    Assessment and Plan Assessment & Plan   1. Vertigo (Primary) Pt c/o vertigo since cochlear implant  Denies double vision , ear pain, discharge  Pt says his symptoms improving Follow up with ENT   2. Primary hypertension  At goal  Cont with the same    Latest Ref Rng &  Units 08/08/2023    1:33 PM 07/08/2023    9:06 AM 12/31/2022   10:31 PM  BMP  Glucose 70 - 99 mg/dL 897  93  895   BUN 8 - 23 mg/dL 17  18  10    Creatinine 0.61 - 1.24 mg/dL 9.02  9.02  9.09   BUN/Creat Ratio 6 - 22 (calc)  SEE NOTE:    Sodium 135 - 145 mmol/L 137  141  136   Potassium 3.5 - 5.1 mmol/L 3.9  4.0  3.8   Chloride 98 - 111 mmol/L 103  105  97   CO2 22 - 32 mmol/L 25  30  24    Calcium  8.9 - 10.3 mg/dL 9.6  9.7  9.6      3. Hx of transient ischemic attack (TIA)  Cont with aspirin , lipitor  4. Gastroesophageal reflux disease, unspecified whether esophagitis present  Stable  Denies bloody or dark stools Cont with  omeprazole   5. GAD (generalized anxiety disorder)  Follow up with behavioral therapy  Other orders - B Complex Vitamins (VITAMIN B COMPLEX PO); Take 1 tablet by mouth as needed. - COVID-19 mRNA vaccine, Pfizer, 30 MCG/0.3ML injection; Inject 0.3 mLs into the muscle once for 1 dose.  Dispense: 0.3 mL; Refill: 0 - Vitamin D , Cholecalciferol , 25 MCG (1000 UT) CAPS; Take 1 capsule by mouth daily.  Dispense: 90 capsule; Refill: 3    40 min Total time spent for obtaining history,  performing a medically appropriate examination and evaluation, reviewing the tests   documenting clinical information in the electronic or other health record,   ,care coordination (not separately reported)

## 2024-01-27 DIAGNOSIS — H903 Sensorineural hearing loss, bilateral: Secondary | ICD-10-CM | POA: Diagnosis not present

## 2024-02-02 DIAGNOSIS — K2930 Chronic superficial gastritis without bleeding: Secondary | ICD-10-CM | POA: Diagnosis not present

## 2024-02-02 DIAGNOSIS — R634 Abnormal weight loss: Secondary | ICD-10-CM | POA: Diagnosis not present

## 2024-02-02 DIAGNOSIS — K5900 Constipation, unspecified: Secondary | ICD-10-CM | POA: Diagnosis not present

## 2024-02-02 DIAGNOSIS — R110 Nausea: Secondary | ICD-10-CM | POA: Diagnosis not present

## 2024-02-03 ENCOUNTER — Ambulatory Visit: Admitting: Licensed Clinical Social Worker

## 2024-02-03 DIAGNOSIS — F4323 Adjustment disorder with mixed anxiety and depressed mood: Secondary | ICD-10-CM

## 2024-02-03 NOTE — Progress Notes (Signed)
 [facility] Counselor/Therapist Progress Note  Patient ID: Kenneth Daniel, MRN: [id number]    Date: 02/03/24  Time Spent: 1111  am - 1200 p : 49 Minutes  Treatment Type: Individual Therapy.  Reported Symptoms: Patient reports anxiety and depression due to a recent illness and his fear of something bad happening. Patient reports having stomach issues that previously left him anxious when traveling which is something he enjoys.   Mental Status Exam: Appearance:  Casual     Behavior: Appropriate  Motor: Normal  Speech/Language:  Clear and Coherent  Affect: Appropriate  Mood: normal  Thought process: normal  Thought content:   WNL  Sensory/Perceptual disturbances:   WNL  Orientation: oriented to person, place, time/date, situation, day of week, month of year, and year  Attention: Good  Concentration: Good  Memory: WNL  Fund of knowledge:  Good  Insight:   Good  Judgment:  Good  Impulse Control: Good    Risk Assessment: Danger to Self:  No Self-injurious Behavior: No Danger to Others: No Duty to Warn:no Physical Aggression / Violence:No  Access to Firearms a concern: No  Gang Involvement:No    Subjective:  Kenneth Daniel Kenneth Daniel Mace participated from office, located at [facility] with Clinician present. Kenneth Daniel consented to treatment.    Kenneth Daniel presented for his session stating that he has had a rough few weeks. He reports that he continues to have issues with upset stomach when he takes out the hearing aide as required by his doctor. He states that he does this at night and then feels sick and has trouble going to bed. He reports that he gets nervous that something is wrong and he has racing thoughts. Kenneth Daniel continues to have medical anxiety related to his stomach issues.  Clinician provided support and feedback via active listening and verbal feedback. Clinician processed with  patient his concerns and discussed coping skills for his anxiety such as:   Use  grounding techniques: Focus on your senses by noticing what you see, hear, smell, taste, and touch.  Practice gratitude: Write down or reflect on things you're grateful for to shift your focus to positive aspects of life.  Challenge negative thoughts: Identify and challenge anxious thoughts that are unrealistic or unhelpful.  Cognitive reframing: Replace negative thoughts with more positive and realistic perspectives.   Kenneth Daniel was fully engaged in therapy session and processed with patient his concerns. Same was insightful and identified his fear of stomach issues due to what he endured last year. Kenneth Daniel admits that he is likely working himself up as the doctors have not been able to find any connection to a serious illness. Kenneth Daniel agreed to utilize the coping skills as well as ask his provider if he can take the hearing aid out while lying down so to help eliminate the vertigo. Kenneth Daniel will use CBT, mindfulness and coping skills to help manage decrease symptoms associated with their diagnosis. Treatment planning to be reviewed by 12/27/2024.  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

## 2024-02-15 ENCOUNTER — Other Ambulatory Visit: Payer: Self-pay

## 2024-02-15 ENCOUNTER — Emergency Department (HOSPITAL_COMMUNITY)
Admission: EM | Admit: 2024-02-15 | Discharge: 2024-02-15 | Disposition: A | Discharge status: Home / Self Care | Attending: Emergency Medicine | Admitting: Emergency Medicine

## 2024-02-15 ENCOUNTER — Emergency Department (HOSPITAL_COMMUNITY)

## 2024-02-15 DIAGNOSIS — R519 Headache, unspecified: Secondary | ICD-10-CM | POA: Diagnosis not present

## 2024-02-15 DIAGNOSIS — Z9621 Cochlear implant status: Secondary | ICD-10-CM | POA: Insufficient documentation

## 2024-02-15 DIAGNOSIS — Z79899 Other long term (current) drug therapy: Secondary | ICD-10-CM | POA: Diagnosis not present

## 2024-02-15 DIAGNOSIS — I10 Essential (primary) hypertension: Secondary | ICD-10-CM | POA: Diagnosis not present

## 2024-02-15 DIAGNOSIS — Z8673 Personal history of transient ischemic attack (TIA), and cerebral infarction without residual deficits: Secondary | ICD-10-CM | POA: Diagnosis not present

## 2024-02-15 DIAGNOSIS — Z7982 Long term (current) use of aspirin: Secondary | ICD-10-CM | POA: Diagnosis not present

## 2024-02-15 DIAGNOSIS — Z8546 Personal history of malignant neoplasm of prostate: Secondary | ICD-10-CM | POA: Diagnosis not present

## 2024-02-15 LAB — CBC WITH DIFFERENTIAL/PLATELET
Abs Immature Granulocytes: 0 K/uL (ref 0.00–0.07)
Basophils Absolute: 0 K/uL (ref 0.0–0.1)
Basophils Relative: 0 %
Eosinophils Absolute: 0 K/uL (ref 0.0–0.5)
Eosinophils Relative: 1 %
HCT: 41.5 % (ref 39.0–52.0)
Hemoglobin: 12.9 g/dL — ABNORMAL LOW (ref 13.0–17.0)
Immature Granulocytes: 0 %
Lymphocytes Relative: 27 %
Lymphs Abs: 1 K/uL (ref 0.7–4.0)
MCH: 27.7 pg (ref 26.0–34.0)
MCHC: 31.1 g/dL (ref 30.0–36.0)
MCV: 89.2 fL (ref 80.0–100.0)
Monocytes Absolute: 0.4 K/uL (ref 0.1–1.0)
Monocytes Relative: 10 %
Neutro Abs: 2.3 K/uL (ref 1.7–7.7)
Neutrophils Relative %: 62 %
Platelets: 208 K/uL (ref 150–400)
RBC: 4.65 MIL/uL (ref 4.22–5.81)
RDW: 15.9 % — ABNORMAL HIGH (ref 11.5–15.5)
WBC: 3.8 K/uL — ABNORMAL LOW (ref 4.0–10.5)
nRBC: 0 % (ref 0.0–0.2)

## 2024-02-15 LAB — BASIC METABOLIC PANEL WITH GFR
Anion gap: 12 (ref 5–15)
BUN: 16 mg/dL (ref 8–23)
CO2: 24 mmol/L (ref 22–32)
Calcium: 9.4 mg/dL (ref 8.9–10.3)
Chloride: 101 mmol/L (ref 98–111)
Creatinine, Ser: 0.99 mg/dL (ref 0.61–1.24)
GFR, Estimated: >60 mL/min (ref >60)
Glucose, Bld: 97 mg/dL (ref 70–99)
Potassium: 3.7 mmol/L (ref 3.5–5.1)
Sodium: 137 mmol/L (ref 135–145)

## 2024-02-15 LAB — RESP PANEL BY RT-PCR (RSV, FLU A&B, COVID)  RVPGX2
Influenza A by PCR: NEGATIVE
Influenza B by PCR: NEGATIVE
Resp Syncytial Virus by PCR: NEGATIVE
SARS Coronavirus 2 by RT PCR: NEGATIVE

## 2024-02-15 MED ORDER — ACETAMINOPHEN 500 MG PO TABS
1000.0000 mg | ORAL_TABLET | Freq: Once | ORAL | Status: AC
  Administered 2024-02-15: 1000 mg via ORAL
  Filled 2024-02-15: qty 2

## 2024-02-15 NOTE — ED Triage Notes (Signed)
 Patient c/o headache x 1 day. Patient stated Funny feeling on my left leg .  Patient report nausea, denies vomiting. Patient denies dizziness, blurred vision.

## 2024-02-15 NOTE — ED Provider Notes (Signed)
 [place] EMERGENCY DEPARTMENT AT [facility] Provider Note   CSN: [id number] Arrival date & time: 02/15/24  9766     Patient presents with: Headache   Kenneth Daniel is a 78 y.o. male with history of TIA, pruritus, chronic observation, GERD, hearing loss.  Patient presents to ED for evaluation of headache.  States that beginning today Kenneth Daniel developed a headache on the left side of his head that does not radiate.  Kenneth Daniel reports that 3 to 4 weeks ago Kenneth Daniel had a cochlear implant that was turned on the same day.  Reports that Kenneth Daniel does not have a history of headaches.  Denies fevers, neck stiffness, neck pain, photophobia, blurred vision, dizziness.  Has not taken medication for headache because Kenneth Daniel reports Kenneth Daniel does not know what to take.  When asked to describe the headache, Kenneth Daniel reports that it is described as low-grade.  When asked to rate this headache Kenneth Daniel rates it a 1 out of 10.   Headache      Prior to Admission medications   Medication Sig Start Date End Date Taking? Authorizing Provider  acetaminophen  (TYLENOL ) 500 MG tablet Take 1,000 mg by mouth every 8 (eight) hours as needed for moderate pain (pain score 4-6).    [provider]  ALPRAZolam  (XANAX ) 0.25 MG tablet Take 0.25 mg by mouth daily as needed. 12/26/21   [provider]  amLODipine  (NORVASC ) 10 MG tablet Take 10 mg by mouth every evening.    [provider]  ascorbic acid  (VITAMIN C) 500 MG tablet Take 500 mg by mouth.    [provider]  aspirin  81 MG EC tablet Take 81 mg by mouth at bedtime.    [provider]  atorvastatin  (LIPITOR) 40 MG tablet Take 40 mg by mouth at bedtime.    [provider]  Azelastine  HCl 137 MCG/SPRAY SOLN PLACE 2 SPRAYS INTO BOTH NOSTRILS 2 (TWO) TIMES DAILY 12/23/23   Marinda Rocky SAILOR, MD  B Complex Vitamins (VITAMIN B COMPLEX PO) Take 1 tablet by mouth as needed.    [provider]  desloratadine  (CLARINEX ) 5 MG tablet Take 1  tablet (5 mg total) by mouth daily as needed. 12/06/23   Marinda Rocky SAILOR, MD  EPINEPHrine  0.3 mg/0.3 mL IJ SOAJ injection Inject 0.3 mg into the muscle as needed. Bring to your allergy injection appointments. 12/06/23   Marinda Rocky SAILOR, MD  famotidine  (PEPCID ) 40 MG tablet Take 40 mg by mouth daily.    [provider]  FARXIGA 10 MG TABS tablet  07/31/23   [provider]  fluticasone  (FLONASE ) 50 MCG/ACT nasal spray Place 2 sprays into both nostrils daily. 12/06/23   Marinda Rocky SAILOR, MD  hydrOXYzine  (VISTARIL ) 25 MG capsule Take 1 capsule (25 mg total) by mouth every 8 (eight) hours as needed for anxiety. 12/18/22   Vicky Charleston, PA-C  Magnesium  250 MG TABS Take 250 mg by mouth at bedtime.    [provider]  meclizine  (ANTIVERT ) 25 MG tablet Take 1 tablet (25 mg total) by mouth 3 (three) times daily as needed for dizziness (vertigo). 01/14/24   Banister, Pamela K, MD  Melatonin 5 MG CAPS Take 10 mg by mouth at bedtime.    [provider]  nebivolol  (BYSTOLIC ) 10 MG tablet TAKE 1 TABLET BY MOUTH EVERY DAY IN THE MORNING 04/26/23   Tolia, Sunit, DO  omeprazole  (PRILOSEC) 40 MG capsule TAKE 1 CAPSULE BY MOUTH EVERY MORNING AND AT BEDTIME 11/03/23  Sherlynn Madden, MD  ondansetron  (ZOFRAN -ODT) 4 MG disintegrating tablet 1 tablet on the tongue and allow to dissolve Orally every 8 hours; Duration: 30 days As needed 12/21/23   [provider]  Polyethylene Glycol 3350  (MIRALAX  PO) Take by mouth daily.    [provider]  sucralfate  (CARAFATE ) 1 GM/10ML suspension SMARTSIG:2 Teaspoon By Mouth 4 Times Daily 11/10/22   [provider]  valsartan -hydrochlorothiazide  (DIOVAN -HCT) 320-12.5 MG tablet Take 1 tablet by mouth daily. 02/21/20   [provider]  Vitamin D , Cholecalciferol , 25 MCG (1000 UT) CAPS Take 1 capsule by mouth daily. 01/25/24   Sherlynn Madden, MD  Wheat Dextrin (BENEFIBER PO) Take by mouth 3 (three) times daily.     [provider]  XTANDI  40 MG tablet Take 160 mg by mouth every evening. 10/19/22   [provider]    Allergies: Clonidine    Review of Systems  Neurological:  Positive for headaches.  All other systems reviewed and are negative.   Updated Vital Signs BP (!) 160/94   Pulse 81   Temp 97.7 F (36.5 C) (Oral)   Resp 16   SpO2 100%   Physical Exam Vitals and nursing note reviewed.  Constitutional:      General: Kenneth Daniel is not in acute distress.    Appearance: Kenneth Daniel is well-developed.  HENT:     Head: Normocephalic and atraumatic.  Eyes:     Conjunctiva/sclera: Conjunctivae normal.  Neck:     Comments: Full ROM cervical spine.  No meningismus. Cardiovascular:     Rate and Rhythm: Normal rate and regular rhythm.     Heart sounds: No murmur heard. Pulmonary:     Effort: Pulmonary effort is normal. No respiratory distress.     Breath sounds: Normal breath sounds.  Abdominal:     Palpations: Abdomen is soft.     Tenderness: There is no abdominal tenderness.  Musculoskeletal:        General: No swelling.     Cervical back: Neck supple.  Skin:    General: Skin is warm and dry.     Capillary Refill: Capillary refill takes less than 2 seconds.  Neurological:     General: No focal deficit present.     Mental Status: Kenneth Daniel is alert and oriented to person, place, and time. Mental status is at baseline.     GCS: GCS eye subscore is 4. GCS verbal subscore is 5. GCS motor subscore is 6.     Cranial Nerves: Cranial nerves 2-12 are intact. No cranial nerve deficit.     Sensory: Sensation is intact. No sensory deficit.     Motor: Motor function is intact. No weakness.     Coordination: Coordination is intact. Heel to Kenneth Daniel Test normal.     Comments: CN III through XII intact.  Intact finger-nose, heel-to-shin.  No pronator drift.  No slurred speech.  Equal strength throughout.  Equal sensation throughout.  PERRL.  Tracks past midline.  Alert and oriented x 4.  Psychiatric:         Mood and Affect: Mood normal.     (all labs ordered are listed, but only abnormal results are displayed) Labs Reviewed  CBC WITH DIFFERENTIAL/PLATELET - Abnormal; Notable for the following components:      Result Value   WBC 3.8 (*)    Hemoglobin 12.9 (*)    RDW 15.9 (*)    All other components within normal limits  RESP PANEL BY RT-PCR (RSV, FLU A&B, COVID)  [id number]  BASIC METABOLIC PANEL WITH GFR    EKG: None  Radiology: CT Head Wo Contrast Result Date: 02/15/2024 EXAM: CT HEAD WITHOUT CONTRAST 02/15/2024 04:32:11 AM TECHNIQUE: CT of the head was performed without the administration of intravenous contrast. Automated exposure control, iterative reconstruction, and/or weight based adjustment of the mA/kV was utilized to reduce the radiation dose to as low as reasonably achievable. COMPARISON: CT angiogram of the head and neck dated 08/08/2023. CLINICAL HISTORY: Headache, new onset (Age >= 51y). Headache and funny feeling in his legs since yesterday, hx of prostate ca. FINDINGS: BRAIN AND VENTRICLES: Since a previous study, the cochlear implant has been placed on the left. There is spray artifact associated with the implant partially obscuring the left cerebral hemisphere. The brain otherwise appears normal. No acute hemorrhage. No evidence of acute infarct. No hydrocephalus. No extra-axial collection. No mass effect or midline shift. There is moderate calcific atheromatous disease within the carotid siphons bilaterally. ORBITS: No acute abnormality. SINUSES: No acute abnormality. SOFT TISSUES AND SKULL: Since a previous study, the cochlear implant has been placed on the left. No acute soft tissue abnormality. No skull fracture. IMPRESSION: 1. No acute intracranial abnormality. 2. Artifact from left cochlear implant partially obscures evaluation of the left cerebral hemisphere. 3. 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 Electronically signed by: Evalene Coho  MD 02/15/2024 04:51 AM EDT RP Workstation: [id number]    Procedures   Medications Ordered in the ED  acetaminophen  (TYLENOL ) tablet 1,000 mg (1,000 mg Oral Given 02/15/24 0346)    Clinical Course as of 02/15/24 0524  Tue Feb 15, 2024  0515 Reevaluated.  Reports headache has reduced after Tylenol .  Awaiting viral panel swab. [CG]    Clinical Course User Index [CG] Ruthell Lonni FALCON, PA-C    Medical Decision Making Amount and/or Complexity of Data Reviewed Labs: ordered. Radiology: ordered.  Risk OTC drugs.   This is a 78 year old male presenting to the ED with what Kenneth Daniel describes as a low-grade headache rated 1 out of 10.  On exam, Kenneth Daniel is afebrile and nontachycardic.  Lung sounds are clear bilaterally, no hypoxia.  Abdomen soft and compressible.  Neuroexam at baseline.  Labs collected to include CBC, BMP, viral panel.  Also added on CT scan of head.  Patient treated with Tylenol .  CBC without leukocytosis or anemia.  Metabolic panel is grossly unremarkable.  Viral panel negative for all.  CT head unremarkable.  After medication given, patient reports reduction of headache.  Kenneth Daniel is slightly hypertensive at 160 but denies chest pain, shortness of breath or blurred vision.  Kenneth Daniel reports Kenneth Daniel has not taken his at home morning home blood pressure medication.  Kenneth Daniel was advised to take this upon arrival home.  At this time Kenneth Daniel will be discharged.  Kenneth Daniel reports reduction of headache.  Advised to take Tylenol  in the future for headaches.  Advised to follow-up with PCP.  Was given return precautions and Kenneth Daniel voiced understanding.  Stable to discharge.    Final diagnoses:  Acute nonintractable headache, unspecified headache type    ED Discharge Orders     None          Ruthell Lonni FALCON, PA-C 02/15/24 0524    Theadore Ozell HERO, MD 02/15/24 [phone]

## 2024-02-15 NOTE — Discharge Instructions (Signed)
 Please take Tylenol , 650 mg, every 6 hours as needed for headache.  Stay, blood pressure medication upon arrival home.  Please follow-up with your PCP for reevaluation.  Return to the ED with new symptoms.

## 2024-02-17 DIAGNOSIS — H903 Sensorineural hearing loss, bilateral: Secondary | ICD-10-CM | POA: Diagnosis not present

## 2024-02-21 DIAGNOSIS — H814 Vertigo of central origin: Secondary | ICD-10-CM | POA: Diagnosis not present

## 2024-02-21 DIAGNOSIS — F419 Anxiety disorder, unspecified: Secondary | ICD-10-CM | POA: Diagnosis not present

## 2024-02-21 DIAGNOSIS — I10 Essential (primary) hypertension: Secondary | ICD-10-CM | POA: Diagnosis not present

## 2024-02-28 ENCOUNTER — Ambulatory Visit: Admitting: Licensed Clinical Social Worker

## 2024-03-07 ENCOUNTER — Ambulatory Visit (INDEPENDENT_AMBULATORY_CARE_PROVIDER_SITE_OTHER)

## 2024-03-07 DIAGNOSIS — J302 Other seasonal allergic rhinitis: Secondary | ICD-10-CM | POA: Diagnosis not present

## 2024-03-07 DIAGNOSIS — J309 Allergic rhinitis, unspecified: Secondary | ICD-10-CM | POA: Diagnosis not present

## 2024-03-15 DIAGNOSIS — Z7982 Long term (current) use of aspirin: Secondary | ICD-10-CM | POA: Diagnosis not present

## 2024-03-15 DIAGNOSIS — I129 Hypertensive chronic kidney disease with stage 1 through stage 4 chronic kidney disease, or unspecified chronic kidney disease: Secondary | ICD-10-CM | POA: Diagnosis not present

## 2024-03-15 DIAGNOSIS — I2510 Atherosclerotic heart disease of native coronary artery without angina pectoris: Secondary | ICD-10-CM | POA: Diagnosis not present

## 2024-03-15 DIAGNOSIS — N182 Chronic kidney disease, stage 2 (mild): Secondary | ICD-10-CM | POA: Diagnosis not present

## 2024-03-15 DIAGNOSIS — Z8249 Family history of ischemic heart disease and other diseases of the circulatory system: Secondary | ICD-10-CM | POA: Diagnosis not present

## 2024-03-15 DIAGNOSIS — R32 Unspecified urinary incontinence: Secondary | ICD-10-CM | POA: Diagnosis not present

## 2024-03-15 DIAGNOSIS — E785 Hyperlipidemia, unspecified: Secondary | ICD-10-CM | POA: Diagnosis not present

## 2024-03-15 DIAGNOSIS — Z833 Family history of diabetes mellitus: Secondary | ICD-10-CM | POA: Diagnosis not present

## 2024-03-15 DIAGNOSIS — K219 Gastro-esophageal reflux disease without esophagitis: Secondary | ICD-10-CM | POA: Diagnosis not present

## 2024-03-23 ENCOUNTER — Ambulatory Visit: Admitting: Licensed Clinical Social Worker

## 2024-03-23 DIAGNOSIS — F4323 Adjustment disorder with mixed anxiety and depressed mood: Secondary | ICD-10-CM

## 2024-03-23 NOTE — Progress Notes (Signed)
 [facility] Counselor/Therapist Progress Note  Patient ID: Kenneth Daniel, MRN: [id number]    Date: 03/23/24  Time Spent: 0917  am - 0959 am : 42 Minutes  Treatment Type: Individual Therapy.  Reported Symptoms: Patient reports anxiety and depression due to a recent illness and his fear of something bad happening. Patient reports having stomach issues that previously left him anxious when traveling which is something he enjoys.   Mental Status Exam: Appearance:  Casual     Behavior: Appropriate  Motor: Normal  Speech/Language:  Clear and Coherent  Affect: Appropriate  Mood: normal  Thought process: normal  Thought content:   WNL  Sensory/Perceptual disturbances:   WNL  Orientation: oriented to person, place, time/date, situation, day of week, month of year, and year  Attention: Good  Concentration: Good  Memory: WNL  Fund of knowledge:  Good  Insight:   Good  Judgment:  Good  Impulse Control: Good    Risk Assessment: Danger to Self:  No Self-injurious Behavior: No Danger to Others: No Duty to Warn:no Physical Aggression / Violence:No  Access to Firearms a concern: No  Gang Involvement:No    Subjective:  [name] participated from office, located at [address] with Clinician present. Barrie consented to treatment.    Darian presented late for his session. Patient reports that he couldn't find a parking spot. [name] reports that he is doing well but struggling with sleep. Patient states that he struggles to get to bed at a decent time. He also denied have a set routine. He reports that when he makes himself get in bed at a reasonable time, he does sleep better. He reports struggling with setting a routine and sticking with it.  Clinician actively listened and provided support and feedback. Clinician and patient processed the importance of routine and the benefits. A routine benefits your mental and physical health by reducing stress and anxiety,  increasing productivity, and improving decision-making and focus. It creates a sense of control and safety, promotes consistency in your daily life, and can lead to better sleep and overall well-being.   Kim was fully engaged in therapy session and processed with patient his concerns. Same was insightful and identified his fear of stomach issues due to what he endured last year. [name] admits that he is likely working himself up as the doctors have not been able to find any connection to a serious illness. [name] agreed to utilize the coping skills as well as ask his provider if he can take the hearing aid out while lying down so to help eliminate the vertigo. [name] will use CBT, mindfulness and coping skills to help manage decrease symptoms associated with their diagnosis. Treatment planning to be reviewed by 12/27/2024.   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

## 2024-04-03 DIAGNOSIS — K2930 Chronic superficial gastritis without bleeding: Secondary | ICD-10-CM | POA: Diagnosis not present

## 2024-04-03 DIAGNOSIS — R110 Nausea: Secondary | ICD-10-CM | POA: Diagnosis not present

## 2024-04-03 DIAGNOSIS — K5900 Constipation, unspecified: Secondary | ICD-10-CM | POA: Diagnosis not present

## 2024-04-06 ENCOUNTER — Ambulatory Visit: Admitting: *Deleted

## 2024-04-06 DIAGNOSIS — J302 Other seasonal allergic rhinitis: Secondary | ICD-10-CM

## 2024-04-06 DIAGNOSIS — J309 Allergic rhinitis, unspecified: Secondary | ICD-10-CM

## 2024-04-12 ENCOUNTER — Encounter: Payer: Self-pay | Admitting: Cardiology

## 2024-04-25 ENCOUNTER — Ambulatory Visit: Admitting: Licensed Clinical Social Worker

## 2024-04-25 ENCOUNTER — Encounter: Payer: Self-pay | Admitting: Family

## 2024-05-02 ENCOUNTER — Ambulatory Visit: Admitting: Licensed Clinical Social Worker

## 2024-05-02 DIAGNOSIS — F4323 Adjustment disorder with mixed anxiety and depressed mood: Secondary | ICD-10-CM | POA: Diagnosis not present

## 2024-05-02 NOTE — Progress Notes (Signed)
 [facility] Counselor/Therapist Progress Note  Patient ID: Kenneth Daniel, MRN: [id number]    Date: 05/02/2024  Time Spent: 0114  pm - 0200 pm : 46 Minutes  Treatment Type: Individual Therapy.  Reported Symptoms: Patient reports anxiety and depression due to a recent illness and his fear of something bad happening. Patient reports having stomach issues that previously left him anxious when traveling which is something he enjoys.   Mental Status Exam: Appearance:  Casual     Behavior: Appropriate  Motor: Normal  Speech/Language:  Clear and Coherent  Affect: Appropriate  Mood: normal  Thought process: normal  Thought content:   WNL  Sensory/Perceptual disturbances:   WNL  Orientation: oriented to person, place, time/date, situation, day of week, month of year, and year  Attention: Good  Concentration: Good  Memory: WNL  Fund of knowledge:  Good  Insight:   Good  Judgment:  Good  Impulse Control: Good    Risk Assessment: Danger to Self:  No Self-injurious Behavior: No Danger to Others: No Duty to Warn:no Physical Aggression / Violence:No  Access to Firearms a concern: No  Gang Involvement:No    Subjective:  Kenneth Daniel Kenneth Daniel Mace participated from office, located at [facility] with Clinician present. Kenneth Daniel consented to treatment.    Kenneth Daniel presented for his session approximately 13 minutes late. He was pleasant and cooperative. He reports that his hearing is improving with time where he had the surgery.He reported having a good time with his family that came in for the holidays. Kenneth Daniel states that he wants to take care of himself. He states he has learned about how his gut and what he puts into has an impact on his body. He reports that he was enjoying going to the gym but it moved and he felt it was a congested area and he felt anxious. He reports that he found an easier way to get there without all the traffic. He reports that he continues to go the gym since  then. Kenneth Daniel reports that he continues to be aware of his eating habits and how he takes care of his body. He reports that he is going to see when he can fly again. He states he doesn't currently have any trips planned but would consider it if he gets released from the doctor.  Clinician actively listened and provided support and feedback. Clinician and patient processed the importance of routine and the benefits. A routine benefits your mental and physical health by reducing stress and anxiety, increasing productivity, and improving decision-making and focus. It creates a sense of control and safety, promotes consistency in your daily life, and can lead to better sleep and overall well-being.    Kenneth Daniel was fully engaged in therapy session and processed with patient his concerns. Kenneth Daniel agreed to utilize the coping skills as well as ask work with his provider to continue to monitor any stomach issues.  Kenneth Daniel reports that he has not had any recent issues with vertigo and it appears to have cleared up. Kenneth Daniel will use CBT, mindfulness and coping skills to help manage decrease symptoms associated with their diagnosis. Treatment planning to be reviewed by 12/27/2024.   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

## 2024-05-03 NOTE — Progress Notes (Signed)
   This encounter was created in error - please disregard. No show

## 2024-05-07 ENCOUNTER — Other Ambulatory Visit: Payer: Self-pay | Admitting: Internal Medicine

## 2024-05-09 ENCOUNTER — Ambulatory Visit (INDEPENDENT_AMBULATORY_CARE_PROVIDER_SITE_OTHER)

## 2024-05-09 DIAGNOSIS — J302 Other seasonal allergic rhinitis: Secondary | ICD-10-CM

## 2024-05-11 ENCOUNTER — Other Ambulatory Visit: Payer: Self-pay

## 2024-05-11 DIAGNOSIS — R5383 Other fatigue: Secondary | ICD-10-CM

## 2024-05-11 DIAGNOSIS — I10 Essential (primary) hypertension: Secondary | ICD-10-CM

## 2024-05-11 MED ORDER — NEBIVOLOL HCL 10 MG PO TABS: 10.0000 mg | ORAL_TABLET | Freq: Every day | ORAL | 3 refills | Status: AC

## 2024-05-22 ENCOUNTER — Ambulatory Visit: Admitting: Licensed Clinical Social Worker

## 2024-05-31 ENCOUNTER — Telehealth: Payer: Self-pay | Admitting: Cardiology

## 2024-05-31 NOTE — Telephone Encounter (Signed)
 Called pt to address questions regarding CT.  Advised testing will be performed at [facility] on 06/05/24 at 11:30 arrive 30 min early.  All questions answered.

## 2024-05-31 NOTE — Telephone Encounter (Signed)
 Patient called answering service stating that he has some questions regarding his CT scan scheduled in February. Please call

## 2024-06-02 ENCOUNTER — Other Ambulatory Visit: Payer: Self-pay

## 2024-06-02 ENCOUNTER — Ambulatory Visit (HOSPITAL_COMMUNITY)
Admission: EM | Admit: 2024-06-02 | Discharge: 2024-06-02 | Disposition: A | Discharge status: Home / Self Care | Attending: Family Medicine | Admitting: Family Medicine

## 2024-06-02 DIAGNOSIS — R5383 Other fatigue: Secondary | ICD-10-CM | POA: Insufficient documentation

## 2024-06-02 DIAGNOSIS — J069 Acute upper respiratory infection, unspecified: Secondary | ICD-10-CM | POA: Insufficient documentation

## 2024-06-02 LAB — CBC
HCT: 38.3 % — ABNORMAL LOW (ref 39.0–52.0)
Hemoglobin: 12.6 g/dL — ABNORMAL LOW (ref 13.0–17.0)
MCH: 28.5 pg (ref 26.0–34.0)
MCHC: 32.9 g/dL (ref 30.0–36.0)
MCV: 86.7 fL (ref 80.0–100.0)
Platelets: 207 10*3/uL (ref 150–400)
RBC: 4.42 MIL/uL (ref 4.22–5.81)
RDW: 15 % (ref 11.5–15.5)
WBC: 3.3 10*3/uL — ABNORMAL LOW (ref 4.0–10.5)
nRBC: 0 % (ref 0.0–0.2)

## 2024-06-02 LAB — BASIC METABOLIC PANEL WITH GFR
Anion gap: 10 (ref 5–15)
BUN: 16 mg/dL (ref 8–23)
CO2: 27 mmol/L (ref 22–32)
Calcium: 9.5 mg/dL (ref 8.9–10.3)
Chloride: 101 mmol/L (ref 98–111)
Creatinine, Ser: 1.07 mg/dL (ref 0.61–1.24)
GFR, Estimated: >60 mL/min
Glucose, Bld: 99 mg/dL (ref 70–99)
Potassium: 4.4 mmol/L (ref 3.5–5.1)
Sodium: 138 mmol/L (ref 135–145)

## 2024-06-02 LAB — POCT INFLUENZA A/B
Influenza A, POC: NEGATIVE
Influenza B, POC: NEGATIVE

## 2024-06-02 NOTE — ED Provider Notes (Signed)
 " [facility]    CSN: [id number] Arrival date & time: 06/02/24  1551      History   Chief Complaint Chief Complaint  Patient presents with   Weakness    HPI ABEDNEGO YEATES is a 79 y.o. male.    Weakness Here for weakness or feeling tired.  First noted it on the morning of January 28.  At the same time he felt very cold, but he feels that was because his ceiling fan had inadvertently gotten turned on while he was sleeping.  He states he warmed up easily.  He did not have any other symptoms that day.  Then yesterday, January 29, he felt good and had no symptoms  He once again felt tired when he awoke today and did have a little bit of postnasal congestion. No fever.  No vomiting or nausea.  He maybe has had a little cough.  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, right    per last duplex in epic 06-04-2020  right ICA 16-49% and right ECA >50%   Chronic constipation    GERD (gastroesophageal reflux disease)    History of acute pyelonephritis 09/2020   w/ admission in epic due to severe sepsis   History of COVID-19 11/18/2020   positive result in epic;   per pt mild to moderate symptoms that resolved   History of external beam radiation therapy    completed in 2015 for recurrent prostate cancer (done in [facility])   History of prostate cancer    (current urologist-- dr rosalind, and current treatment lupron  injeciton q 6months) ;   per pt in [place], [place] 2004  s/p radical prostatectomy;   recurrence 2015 completed radiation   HLD (hyperlipidemia)    Hypertension    followed by pcp and cardiology---   (05-29-2020  nuclear stress test in epic , low risk no ischemia nuclear ef 63%)   Inguinal hernia, right    Non-rheumatic aortic regurgitation    mild to moderate AR without stenosis per last echo in epic 02-27-2021    Perennial allergic rhinitis    followed by dr gollager ([facility])   Wears dentures    upper   Wears glasses    Wears hearing aid in both ears     Patient Active Problem List   Diagnosis Date Noted   Adjustment disorder with mixed anxiety and depressed mood 11/30/2023   TIA (transient ischemic attack) 08/08/2023   Nonrheumatic aortic valve insufficiency    Perennial allergic rhinitis 12/12/2020   Pruritus 12/12/2020   Seasonal allergic conjunctivitis 12/12/2020   Acute pyelonephritis 09/22/2020   Pyelonephritis 09/22/2020   Myelopathy (HCC) 11/09/2019    Past Surgical History:  Procedure Laterality Date   ANTERIOR CERVICAL DECOMP/DISCECTOMY FUSION N/A 11/09/2019   Procedure: ANTERIOR CERVICAL DECOMPRESSION FUSION CERVICAL 3-4 WITH INSTRUMENTATION AND ALLOGRAFT;  Surgeon: Beuford Anes, MD;  Location: [facility] OR;  Service: Orthopedics;  Laterality: N/A;   CARDIAC CATHETERIZATION  2002   in [place], [place];   per pt stress test with possible ischemia,  told arteries normal   COLONOSCOPY  2020   CYST EXCISION  2005   per pt from back , was benign   INGUINAL HERNIA REPAIR Right 06/04/2021   Procedure: OPEN RIGHT INGUINAL HERNIA REPAIR;  Surgeon: Signe,  Mitzie LABOR, MD;  Location: [facility];  Service: General;  Laterality: Right;   PROSTATECTOMY  2004   in [place], [place]   UPPER GI ENDOSCOPY  2021       Home Medications    Prior to Admission medications  Medication Sig Start Date End Date Taking? Authorizing Provider  amLODipine  (NORVASC ) 10 MG tablet Take 10 mg by mouth every evening.   Yes [provider]  ascorbic acid  (VITAMIN C) 500 MG tablet Take 500 mg by mouth.   Yes [provider]  aspirin  81 MG EC tablet Take 81 mg by mouth at bedtime.   Yes [provider]  atorvastatin  (LIPITOR) 40 MG tablet Take 40 mg by mouth at bedtime.   Yes [provider]  Azelastine  HCl 137 MCG/SPRAY SOLN PLACE 2 SPRAYS INTO BOTH NOSTRILS  2 (TWO) TIMES DAILY 05/08/24  Yes Marinda Rocky SAILOR, MD  B Complex Vitamins (VITAMIN B COMPLEX PO) Take 1 tablet by mouth as needed.   Yes [provider]  desloratadine  (CLARINEX ) 5 MG tablet Take 1 tablet (5 mg total) by mouth daily as needed. 12/06/23  Yes Marinda Rocky SAILOR, MD  famotidine  (PEPCID ) 40 MG tablet Take 40 mg by mouth daily.   Yes [provider]  FARXIGA 10 MG TABS tablet  07/31/23  Yes [provider]  fluticasone  (FLONASE ) 50 MCG/ACT nasal spray Place 2 sprays into both nostrils daily. 12/06/23  Yes Marinda Rocky SAILOR, MD  hydrOXYzine  (VISTARIL ) 25 MG capsule Take 1 capsule (25 mg total) by mouth every 8 (eight) hours as needed for anxiety. 12/18/22  Yes Vicky Charleston, PA-C  Magnesium  250 MG TABS Take 250 mg by mouth at bedtime.   Yes [provider]  meclizine  (ANTIVERT ) 25 MG tablet Take 1 tablet (25 mg total) by mouth 3 (three) times daily as needed for dizziness (vertigo). 01/14/24  Yes Walid Haig K, MD  Melatonin 5 MG CAPS Take 10 mg by mouth at bedtime.   Yes [provider]  nebivolol  (BYSTOLIC ) 10 MG tablet Take 1 tablet (10 mg total) by mouth daily. 05/11/24  Yes Tolia, Sunit, DO  omeprazole  (PRILOSEC) 40 MG capsule TAKE 1 CAPSULE BY MOUTH EVERY MORNING AND AT BEDTIME 11/03/23  Yes Sherlynn Madden, MD  Polyethylene Glycol 3350  (MIRALAX  PO) Take by mouth daily.   Yes [provider]  sucralfate  (CARAFATE ) 1 GM/10ML suspension SMARTSIG:2 Teaspoon By Mouth 4 Times Daily 11/10/22  Yes [provider]  valsartan -hydrochlorothiazide  (DIOVAN -HCT) 320-12.5 MG tablet Take 1 tablet by mouth daily. 02/21/20  Yes [provider]  Vitamin D , Cholecalciferol , 25 MCG (1000 UT) CAPS Take 1 capsule by mouth daily. 01/25/24  Yes Sherlynn Madden, MD  Wheat Dextrin (BENEFIBER PO) Take by mouth 3 (three) times daily.   Yes [provider]  XTANDI  40 MG tablet Take 160 mg by mouth every evening. 10/19/22  Yes [provider]  acetaminophen  (TYLENOL ) 500 MG tablet Take 1,000 mg by mouth every 8 (eight) hours as needed for moderate pain (pain score 4-6).    [provider]  EPINEPHrine  0.3 mg/0.3 mL IJ SOAJ injection Inject 0.3 mg into the muscle as needed. Bring to your allergy injection appointments. 12/06/23   Marinda Rocky SAILOR, MD  ondansetron  (ZOFRAN -ODT) 4 MG disintegrating tablet 1 tablet on the tongue and allow to dissolve Orally every 8 hours; Duration: 30 days As needed 12/21/23   [provider]    Family History Family History  Problem  Relation Age of Onset   Pancreatic cancer Mother    Prostate cancer Father     Social History Social History[1]   Allergies   Clonidine   Review of Systems Review of Systems  Neurological:  Positive for weakness.     Physical Exam Triage Vital Signs ED Triage Vitals  Encounter Vitals Group     BP 06/02/24 1719 (!) 157/78     Girls Systolic BP Percentile --      Girls Diastolic BP Percentile --      Boys Systolic BP Percentile --      Boys Diastolic BP Percentile --      Pulse Rate 06/02/24 1719 78     Resp 06/02/24 1719 16     Temp 06/02/24 1719 98.1 F (36.7 C)     Temp Source 06/02/24 1719 Oral     SpO2 06/02/24 1719 97 %     Weight 06/02/24 1721 178 lb (80.7 kg)     Height 06/02/24 1721 5' 10 (1.778 m)     Head Circumference --      Peak Flow --      Pain Score 06/02/24 1721 0     Pain Loc --      Pain Education --      Exclude from Growth Chart --    No data found.  Updated Vital Signs BP (!) 157/78 (BP Location: Left Arm)   Pulse 78   Temp 98.1 F (36.7 C) (Oral)   Resp 16   Ht 5' 10 (1.778 m)   Wt 80.7 kg   SpO2 97%   BMI 25.54 kg/m   Visual Acuity Right Eye Distance:   Left Eye Distance:   Bilateral Distance:    Right Eye Near:   Left Eye Near:    Bilateral Near:     Physical Exam Vitals reviewed.  Constitutional:      General: He is not in acute distress.    Appearance: He is not  toxic-appearing.  HENT:     Nose: Nose normal.     Mouth/Throat:     Mouth: Mucous membranes are moist.     Pharynx: No posterior oropharyngeal erythema.     Comments: There is a little clear at the posterior oropharynx Eyes:     Extraocular Movements: Extraocular movements intact.     Conjunctiva/sclera: Conjunctivae normal.     Pupils: Pupils are equal, round, and reactive to light.  Cardiovascular:     Rate and Rhythm: Normal rate and regular rhythm.     Heart sounds: No murmur heard. Pulmonary:     Effort: Pulmonary effort is normal. No respiratory distress.     Breath sounds: Normal breath sounds. No stridor. No wheezing, rhonchi or rales.  Musculoskeletal:     Cervical back: Neck supple.  Lymphadenopathy:     Cervical: No cervical adenopathy.  Skin:    Capillary Refill: Capillary refill takes less than 2 seconds.     Coloration: Skin is not jaundiced or pale.  Neurological:     General: No focal deficit present.     Mental Status: He is alert and oriented to person, place, and time.  Psychiatric:        Behavior: Behavior normal.      UC Treatments / Results  Labs (all labs ordered are listed, but only abnormal results are displayed) Labs Reviewed  CBC  BASIC METABOLIC PANEL WITH GFR  POCT INFLUENZA A/B    EKG   Radiology No results  found.  Procedures Procedures (including critical care time)  Medications Ordered in UC Medications - No data to display  Initial Impression / Assessment and Plan / UC Course  I have reviewed the triage vital signs and the nursing notes.  Pertinent labs & imaging results that were available during my care of the patient were reviewed by me and considered in my medical decision making (see chart for details).     His home COVID test was negative Though it is unlikely to be the flu, I did a flu test and it is also negative.  CBC and BMP are drawn to assess his symptoms.  I do think most likely he has some other viral  illness causing his symptoms.  He will follow-up with primary care   Final Clinical Impressions(s) / UC Diagnoses   Final diagnoses:  Viral URI  Fatigue, unspecified type     Discharge Instructions      The flu test is negative  We have drawn blood to check for anemia and we will check your sugar and electrolytes..  Staff will notify you if there is anything significantly abnormal  Make sure you are drinking plenty of fluids  Please follow-up with your primary care      ED Prescriptions   None    PDMP not reviewed this encounter.    [1]  Social History Tobacco Use   Smoking status: Former    Current packs/day: 0.00    Types: Cigarettes    Start date: 1969    Quit date: 1973    Years since quitting: 53.1   Smokeless tobacco: Never  Vaping Use   Vaping status: Never Used  Substance Use Topics   Alcohol use: Never   Drug use: Never     Vonna Sharlet POUR, MD 06/02/24 1837  "

## 2024-06-02 NOTE — ED Triage Notes (Signed)
 Pt woke up Wednesday and felt profoundly weak. He denies any pain. No short of breath. Pt states he gets tired very easily. No fever. No cough. Pt took at home covid and negative

## 2024-06-02 NOTE — Discharge Instructions (Addendum)
 The flu test is negative  We have drawn blood to check for anemia and we will check your sugar and electrolytes..  Staff will notify you if there is anything significantly abnormal  Make sure you are drinking plenty of fluids  Please follow-up with your primary care

## 2024-06-03 ENCOUNTER — Ambulatory Visit (HOSPITAL_BASED_OUTPATIENT_CLINIC_OR_DEPARTMENT_OTHER): Payer: Self-pay | Admitting: Family Medicine

## 2024-06-05 ENCOUNTER — Ambulatory Visit (HOSPITAL_COMMUNITY)
Admission: RE | Admit: 2024-06-05 | Discharge: 2024-06-05 | Disposition: A | Payer: Medicare PPO | Source: Ambulatory Visit | Attending: Cardiology

## 2024-06-05 DIAGNOSIS — I77810 Thoracic aortic ectasia: Secondary | ICD-10-CM

## 2024-06-07 ENCOUNTER — Ambulatory Visit: Payer: Self-pay | Admitting: Physician Assistant

## 2024-06-07 DIAGNOSIS — I77810 Thoracic aortic ectasia: Secondary | ICD-10-CM

## 2024-06-07 NOTE — Telephone Encounter (Signed)
Patient is calling to discuss results

## 2024-06-07 NOTE — Telephone Encounter (Signed)
Pt calling to discuss results. Please advise.

## 2024-06-09 ENCOUNTER — Ambulatory Visit (INDEPENDENT_AMBULATORY_CARE_PROVIDER_SITE_OTHER): Admitting: *Deleted

## 2024-06-09 DIAGNOSIS — J302 Other seasonal allergic rhinitis: Secondary | ICD-10-CM

## 2024-06-13 ENCOUNTER — Ambulatory Visit: Admitting: Licensed Clinical Social Worker

## 2024-08-15 ENCOUNTER — Ambulatory Visit: Admitting: Cardiology

## 2025-06-04 ENCOUNTER — Other Ambulatory Visit (HOSPITAL_COMMUNITY)
